# Patient Record
Sex: Female | Born: 1960 | Race: White | Hispanic: No | Smoking: Former smoker
Health system: Southern US, Community
[De-identification: ages and names within clinical notes are randomized; demographics above are authoritative.]

## PROBLEM LIST (undated history)

## (undated) ENCOUNTER — Emergency Department (HOSPITAL_COMMUNITY): Admission: EM | Discharge status: Against Medical Advice | Payer: Medicare Other

## (undated) DIAGNOSIS — K5900 Constipation, unspecified: Secondary | ICD-10-CM

## (undated) DIAGNOSIS — I341 Nonrheumatic mitral (valve) prolapse: Secondary | ICD-10-CM

## (undated) DIAGNOSIS — F419 Anxiety disorder, unspecified: Secondary | ICD-10-CM

## (undated) DIAGNOSIS — F32A Depression, unspecified: Secondary | ICD-10-CM

## (undated) DIAGNOSIS — R062 Wheezing: Secondary | ICD-10-CM

## (undated) DIAGNOSIS — M47812 Spondylosis without myelopathy or radiculopathy, cervical region: Secondary | ICD-10-CM

## (undated) DIAGNOSIS — G1221 Amyotrophic lateral sclerosis: Principal | ICD-10-CM

## (undated) DIAGNOSIS — F329 Major depressive disorder, single episode, unspecified: Secondary | ICD-10-CM

## (undated) DIAGNOSIS — J189 Pneumonia, unspecified organism: Secondary | ICD-10-CM

## (undated) DIAGNOSIS — K219 Gastro-esophageal reflux disease without esophagitis: Secondary | ICD-10-CM

## (undated) HISTORY — PX: EYE SURGERY: SHX253

## (undated) HISTORY — DX: Spondylosis without myelopathy or radiculopathy, cervical region: M47.812

## (undated) HISTORY — PX: VAGINA SURGERY: SHX829

## (undated) HISTORY — PX: KIDNEY DONATION: SHX685

## (undated) HISTORY — DX: Wheezing: R06.2

## (undated) HISTORY — DX: Nonrheumatic mitral (valve) prolapse: I34.1

## (undated) HISTORY — DX: Amyotrophic lateral sclerosis: G12.21

## (undated) HISTORY — PX: BREAST SURGERY: SHX581

## (undated) HISTORY — DX: Gastro-esophageal reflux disease without esophagitis: K21.9

## (undated) HISTORY — DX: Anxiety disorder, unspecified: F41.9

## (undated) HISTORY — DX: Major depressive disorder, single episode, unspecified: F32.9

## (undated) HISTORY — DX: Depression, unspecified: F32.A

## (undated) HISTORY — PX: ABDOMINAL HERNIA REPAIR: SHX539

---

## 1999-06-08 ENCOUNTER — Other Ambulatory Visit: Admission: RE | Admit: 1999-06-08 | Discharge: 1999-06-08 | Payer: Self-pay | Admitting: Obstetrics & Gynecology

## 1999-08-27 ENCOUNTER — Emergency Department (HOSPITAL_COMMUNITY): Admission: EM | Admit: 1999-08-27 | Discharge: 1999-08-27 | Payer: Self-pay | Admitting: Emergency Medicine

## 2000-03-01 ENCOUNTER — Encounter: Payer: Self-pay | Admitting: Emergency Medicine

## 2000-03-01 ENCOUNTER — Emergency Department (HOSPITAL_COMMUNITY): Admission: EM | Admit: 2000-03-01 | Discharge: 2000-03-01 | Payer: Self-pay | Admitting: Emergency Medicine

## 2000-04-23 ENCOUNTER — Other Ambulatory Visit: Admission: RE | Admit: 2000-04-23 | Discharge: 2000-04-23 | Payer: Self-pay | Admitting: Obstetrics and Gynecology

## 2001-01-08 ENCOUNTER — Emergency Department (HOSPITAL_COMMUNITY): Admission: EM | Admit: 2001-01-08 | Discharge: 2001-01-08 | Payer: Self-pay | Admitting: Emergency Medicine

## 2001-07-04 ENCOUNTER — Emergency Department (HOSPITAL_COMMUNITY): Admission: EM | Admit: 2001-07-04 | Discharge: 2001-07-05 | Payer: Self-pay | Admitting: Emergency Medicine

## 2001-07-09 ENCOUNTER — Other Ambulatory Visit: Admission: RE | Admit: 2001-07-09 | Discharge: 2001-07-09 | Payer: Self-pay | Admitting: Obstetrics and Gynecology

## 2002-07-14 ENCOUNTER — Other Ambulatory Visit: Admission: RE | Admit: 2002-07-14 | Discharge: 2002-07-14 | Payer: Self-pay | Admitting: Obstetrics and Gynecology

## 2002-12-03 ENCOUNTER — Emergency Department (HOSPITAL_COMMUNITY): Admission: EM | Admit: 2002-12-03 | Discharge: 2002-12-03 | Payer: Self-pay | Admitting: Emergency Medicine

## 2003-10-15 ENCOUNTER — Other Ambulatory Visit: Admission: RE | Admit: 2003-10-15 | Discharge: 2003-10-15 | Payer: Self-pay | Admitting: Obstetrics and Gynecology

## 2004-03-30 ENCOUNTER — Other Ambulatory Visit: Admission: RE | Admit: 2004-03-30 | Discharge: 2004-03-30 | Payer: Self-pay | Admitting: Obstetrics and Gynecology

## 2004-07-12 ENCOUNTER — Ambulatory Visit (HOSPITAL_COMMUNITY): Admission: RE | Admit: 2004-07-12 | Discharge: 2004-07-12 | Payer: Self-pay | Admitting: Obstetrics and Gynecology

## 2005-02-13 ENCOUNTER — Other Ambulatory Visit: Admission: RE | Admit: 2005-02-13 | Discharge: 2005-02-13 | Payer: Self-pay | Admitting: Obstetrics and Gynecology

## 2005-11-16 ENCOUNTER — Ambulatory Visit (HOSPITAL_COMMUNITY): Admission: RE | Admit: 2005-11-16 | Discharge: 2005-11-17 | Payer: Self-pay | Admitting: Obstetrics and Gynecology

## 2010-06-13 ENCOUNTER — Emergency Department (HOSPITAL_COMMUNITY)
Admission: EM | Admit: 2010-06-13 | Discharge: 2010-06-14 | Disposition: A | Discharge status: Home / Self Care | Payer: 59 | Attending: Emergency Medicine | Admitting: Emergency Medicine

## 2010-06-13 DIAGNOSIS — Y92009 Unspecified place in unspecified non-institutional (private) residence as the place of occurrence of the external cause: Secondary | ICD-10-CM | POA: Insufficient documentation

## 2010-06-13 DIAGNOSIS — W260XXA Contact with knife, initial encounter: Secondary | ICD-10-CM | POA: Insufficient documentation

## 2010-06-13 DIAGNOSIS — K219 Gastro-esophageal reflux disease without esophagitis: Secondary | ICD-10-CM | POA: Insufficient documentation

## 2010-06-13 DIAGNOSIS — S61209A Unspecified open wound of unspecified finger without damage to nail, initial encounter: Secondary | ICD-10-CM | POA: Insufficient documentation

## 2010-06-27 ENCOUNTER — Ambulatory Visit (HOSPITAL_COMMUNITY)
Admission: AD | Admit: 2010-06-27 | Discharge: 2010-06-27 | Disposition: A | Discharge status: Home / Self Care | Payer: 59 | Source: Ambulatory Visit | Attending: Obstetrics and Gynecology | Admitting: Obstetrics and Gynecology

## 2010-06-27 DIAGNOSIS — T83711A Erosion of implanted vaginal mesh and other prosthetic materials to surrounding organ or tissue, initial encounter: Secondary | ICD-10-CM | POA: Insufficient documentation

## 2010-06-27 DIAGNOSIS — Y838 Other surgical procedures as the cause of abnormal reaction of the patient, or of later complication, without mention of misadventure at the time of the procedure: Secondary | ICD-10-CM | POA: Insufficient documentation

## 2010-06-27 LAB — CBC
HCT: 40.9 % (ref 36.0–46.0)
Hemoglobin: 13.6 g/dL (ref 12.0–15.0)
MCH: 32.5 pg (ref 26.0–34.0)
MCHC: 33.3 g/dL (ref 30.0–36.0)
MCV: 97.8 fL (ref 78.0–100.0)
Platelets: 205 10*3/uL (ref 150–400)
RBC: 4.18 MIL/uL (ref 3.87–5.11)
RDW: 13.1 % (ref 11.5–15.5)
WBC: 7.1 10*3/uL (ref 4.0–10.5)

## 2010-07-06 NOTE — H&P (Signed)
NAMEVANEZA, PICKART            ACCOUNT NO.:  [id number]  MEDICAL RECORD NO.:  [id number]           PATIENT TYPE:  LOCATION:                                FAC[facility]:  [facility]  PHYSICIAN:  Osborn Coho, M.D.   DATE OF BIRTH:  1960-08-19  DATE OF ADMISSION:  06/27/2010 DATE OF DISCHARGE:                             HISTORY & PHYSICAL   HISTORY OF PRESENT ILLNESS:  Ms. Dwiggins is a 50 year old single white female, para 1-0-0-1 who is status post right nephrectomy presenting for revision of tension-free vaginal tape because of erosion of tension-free vaginal tape.  The patient underwent a cystoscopy and placement of tension-free vaginal tape in 2007 because of stress urinary incontinence.  The patient's symptoms resolved and was without any complaints until approximately 1 year ago when she began to experience persistent vaginal burning.  Though the patient did receive some relief using Premarin vaginal cream, it was noted on physical exam that she  had some erosion of her tension-free vaginal tape.  She denies any urinary urgency, frequency, hematuria, incontinence, or vaginal itching. The patient presents for revision of the erosion of her tension-free vaginal tape.  PAST MEDICAL HISTORY:  OB History:  Gravida 1, para 1-0-0-1.  The patient had a spontaneous vaginal birth in 77.  The infant weighed 7 pounds and 8 ounces.  GYN History:  Menarche 50 years old.  The patient's last menstrual period was April 2011.  She is currently using condoms for her contraception.  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  The patient's last normal Pap smear was December 2011.  Medical history:  Positive for mitral valve prolapse, gastroesophageal reflux disease, panic attacks, premenstrual syndrome, and osteopenia.  Surgical History:  1965 left eye surgery, 2006 left  nephrectomy as a part of donating a kidney, 2007 placement of tension-free vaginal tape. The patient denies any problems with anesthesia or history of blood transfusions.  FAMILY HISTORY:  Hypertension, stroke, thyroid disease, renal failure, and frontal lobe dementia.  SOCIAL HISTORY:  The patient is a widow.  She is a Designer, jewellery at [employer].  HABITS:  She occasionally consumes alcohol.  She smokes one-quarter pack of cigarettes daily.  Denies any use of illicit drugs.  CURRENT MEDICATIONS: 1. Nexium 40 mg daily. 2. Prozac 90 mg weekly. 3. Premarin vaginal cream per vagina 3 times per week.  The patient has a sensitivity to BACTRIM which causes severe GI upset. She denies any sensitivities to latex, soy, shellfish, or peanuts.  REVIEW OF SYSTEMS:  The patient does wear reading glasses.  She denies any chest pain, shortness of breath, headache, vision changes, difficulty swallowing, nausea, vomiting, diarrhea, urinary urgency, frequency, hematuria, urinary retention, flank pain, myalgias, or arthralgias and except as is mentioned in the history of present illness the patient's review of systems is otherwise negative.  PHYSICAL EXAMINATION:  VITAL SIGNS:  Blood pressure 112/70, pulse is 74, temperature 98.4 degrees Fahrenheit orally, weight 142 pounds, and height 5 feet and 3-1/2 inches' tall.  Body  mass index is 24. NECK:  Supple without masses.  There is no thyromegaly or cervical adenopathy. HEART:  Regular rate and rhythm.  There is no murmur. LUNGS:  Clear.  The patient does have kyphosis. BACK:  Without CVA tenderness. ABDOMEN:  No tenderness, masses, or organomegaly. EXTREMITIES:  No clubbing, cyanosis, or edema. PELVIC:  EGBUS is normal.  Vagina is normal though there is visible and palpable erosion of mesh anteriorly.  There is no tenderness.  Cervix is nontender without lesions.  Uterus appears normal size, shape, and consistency without tenderness.   Adnexa without tenderness or masses.  IMPRESSION: 1. Erosion of tension-free vaginal tape mesh. 2. Status post left nephrectomy.  DISPOSITION:  A discussion was held with the patient regarding indications for her procedure along with its risks which include but are not limited to reaction to anesthesia, damage to adjacent organs, infection, excessive bleeding, erosion of tension-free vaginal tape, and urinary tract symptoms.  The patient verbalized understanding of these risks and has consented to proceed with revision of tension-free vaginal tape mesh at [facility] on Jun 27, 2010, at 9:30 a.m.     Elmira J. Lowell Guitar, P.A.-C   ______________________________ Osborn Coho, M.D.    EJP/MEDQ  D:  06/20/2010  T:  06/21/2010  Job:  [id number]  Electronically Signed by Raylene Everts. on 06/22/2010 08:37:39 AM Electronically Signed by Osborn Coho M.D. on 07/06/2010 07:39:57 AM

## 2010-07-20 NOTE — Op Note (Signed)
  [name], Katherine Potts            ACCOUNT NO.:  [id number]  MEDICAL RECORD NO.:  [id number]           PATIENT TYPE:  O  LOCATION:  [facility]                          [facility]  [facility]  PHYSICIAN:  Osborn Coho, M.D.   DATE OF BIRTH:  08-Jun-1960  DATE OF PROCEDURE:  06/27/2010 DATE OF DISCHARGE:  06/27/2010                              OPERATIVE REPORT   PREOPERATIVE DIAGNOSIS:  Erosion of TVT mesh.  POSTOPERATIVE DIAGNOSIS:  Erosion of TVT mesh.  PROCEDURE:  Revision of TVT mesh.  ATTENDING SURGEON:  Osborn Coho, MD  ASSISTANT:  Marquis Lunch. Lowell Guitar, PA  ANESTHESIA:  MAC.  IV FLUIDS:  600 mL.  URINE OUTPUT:  50 mL.  ESTIMATED BLOOD LOSS:  Minimal.  COMPLICATIONS:  None.  PROCEDURE IN DETAIL:  The patient was taken to the operating room. After risks, benefits, and alternatives were discussed with the patient, the patient verbalized understanding, consent signed and witnessed.  The patient was given MAC per Anesthesia and prepped and draped in the normal sterile fashion in the dorsal lithotomy position.  A weighted speculum was placed in the patient's vagina and the area where the erosion on the anterior vaginal wall was identified.  The area at the level of the erosion was injected with dilute 20 units of Pitressin and 100 mL of normal saline.  The anterior vaginal wall was dissected away from the underlying mesh and the underlying tissue and the exposed mesh was excised.  There was slack created so that the vaginal wall could be repaired and was then repaired with interrupted stitches of 3-0 Vicryl. There was minimal bleeding noted.  Sponge, lap, and needle count was correct.  The patient tolerated the procedure well and was returned to the recovery room in good condition.     Osborn Coho, M.D.     AR/MEDQ  D:  07/19/2010  T:  07/19/2010  Job:  [id number]  Electronically Signed by Osborn Coho M.D. on 07/20/2010 10:49:00 AM

## 2012-03-04 ENCOUNTER — Ambulatory Visit (HOSPITAL_COMMUNITY)
Admission: RE | Admit: 2012-03-04 | Discharge: 2012-03-04 | Disposition: A | Discharge status: Home / Self Care | Payer: 59 | Attending: Psychiatry | Admitting: Psychiatry

## 2012-03-04 DIAGNOSIS — F329 Major depressive disorder, single episode, unspecified: Secondary | ICD-10-CM | POA: Insufficient documentation

## 2012-03-04 DIAGNOSIS — F3289 Other specified depressive episodes: Secondary | ICD-10-CM | POA: Insufficient documentation

## 2012-03-04 NOTE — BH Assessment (Signed)
Assessment Note   Katherine Potts is an 52 y.o. female. Pt came for appointment as recommended by [facility] for psych IOP. Has been depressed since the Christmas holiday. Dealing with grief as her Grandmother died 2011/11/13 who lived with her for 9 years. 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 Has been on prozac for years but depression has become much worse. Denies SI/ HI or psychosis. Contracts for safety and states she has never had suicidal thoughts. No past inpatient or outpatient care. "I have things I have to do but no motivation." Would like to start IOP as soon as possible. Information given about IOP with phone number for Jeri Modena to schedule appointment start date. Denies drug use, admits to occasional wine use and 1/2 pack cigarettes daily.  Axis I: Depressive Disorder NOS Axis II: No diagnosis Axis III: No past medical history on file. Axis IV: occupational problems, problems related to social environment and problems with primary support group Axis V: 51-60 moderate symptoms  Past Medical History: No past medical history on file.  No past surgical history on file.  Family History: No family history on file.  Social History:  does not have a smoking history on file. She does not have any smokeless tobacco history on file. Her alcohol and drug histories not on file.  Additional Social History:  Alcohol / Drug Use History of alcohol / drug use?: Yes Substance #1 Name of Substance 1: Wine 1 - Age of First Use: 30's 1 - Amount (size/oz): 1 glass 1 - Frequency: occassionally 1 - Duration: years 1 - Last Use / Amount: 02/26/12 2 glasses  CIWA:   COWS:    Allergies: Allergies no known allergies  Home Medications:  (Not in a hospital admission)  OB/GYN Status:  No LMP  recorded.  General Assessment Data Location of Assessment: [facility] Assessment Services Living Arrangements: Alone;Other (Comment) (24 year old son lives with her) Can pt return to current living arrangement?: Yes Referral Source: Other (employee assistance program with [employer])  Education Status Is patient currently in school?: No  Risk to self Suicidal Ideation: No Suicidal Intent: No Is patient at risk for suicide?: No Suicidal Plan?: No Access to Means: No What has been your use of drugs/alcohol within the last 12 months?: occassional wine Previous Attempts/Gestures: No Other Self Harm Risks: none Intentional Self Injurious Behavior: None Family Suicide History: No Recent stressful life event(s):  (Grandmother passed away Nov 13, 2011) Persecutory voices/beliefs?: No Depression: Yes Depression Symptoms: Fatigue;Feeling worthless/self pity Substance abuse history and/or treatment for substance abuse?: No Suicide prevention information given to non-admitted patients: Yes  Risk to Others Homicidal Ideation: No Thoughts of Harm to Others: No Current Homicidal Intent: No History of harm to others?: No Does patient have access to weapons?: No Criminal Charges Pending?: No Does patient have a court date: No  Psychosis Hallucinations: None noted Delusions: None noted  Mental Status Report Appear/Hygiene: Other (Comment) (appropriate, clean) Eye Contact: Good Motor Activity: Unremarkable Speech: Logical/coherent Level of Consciousness: Alert Mood: Depressed Affect: Blunted Anxiety Level: None Thought Processes: Coherent;Relevant Judgement: Unimpaired Orientation: Person;Place;Time;Situation Obsessive Compulsive Thoughts/Behaviors: None  Cognitive Functioning Concentration: Decreased Memory: Recent Intact;Remote Intact IQ: Average Insight: Good Impulse Control: Good Appetite: Poor Weight Loss: 0  Weight Gain: 0  Sleep:  Decreased (toss and turn, trouble getting to  sleep) Total Hours of Sleep:  (unknown) Vegetative Symptoms: Staying in bed  ADLScreening [facility]) Patient's cognitive ability adequate to safely complete daily activities?: Yes Patient able to express need for assistance with ADLs?: Yes Independently performs ADLs?: Yes (appropriate for developmental age)  Abuse/Neglect [facility]) Physical Abuse: Denies Verbal Abuse: Denies Sexual Abuse: Denies  Prior Inpatient Therapy Prior Inpatient Therapy: No  Prior Outpatient Therapy Prior Outpatient Therapy: No  ADL Screening (condition at time of admission) Patient's cognitive ability adequate to safely complete daily activities?: Yes Patient able to express need for assistance with ADLs?: Yes Independently performs ADLs?: Yes (appropriate for developmental age) Weakness of Legs: None Weakness of Arms/Hands: None  Home Assistive Devices/Equipment Home Assistive Devices/Equipment: None    Abuse/Neglect Assessment (Assessment to be complete while patient is alone) Physical Abuse: Denies Verbal Abuse: Denies Sexual Abuse: Denies Exploitation of patient/patient's resources: Denies Self-Neglect: Denies Values / Beliefs Cultural Requests During Hospitalization: None Spiritual Requests During Hospitalization: None Consults Spiritual Care Consult Needed: No Social Work Consult Needed: No Merchant navy officer (For Healthcare) Advance Directive: Patient does not have advance directive;Patient would not like information Pre-existing out of facility DNR order (yellow form or pink MOST form): No Nutrition Screen- [facility] Adult/[facility]/AP Patient's home diet: Regular  Additional Information 1:1 In Past 12 Months?: No CIRT Risk: No Elopement Risk: No Does patient have medical clearance?: No     Disposition:  Disposition Disposition of Patient: Outpatient treatment;Referred to Type of outpatient treatment: Psych Intensive Outpatient Patient referred to: Other (Comment) (pt given  information to IOP with Altha Harm number to call)  On Site Evaluation by:   Reviewed with Physician:     Leafy Kindle 03/04/2012 4:56 PM

## 2012-03-06 ENCOUNTER — Encounter (HOSPITAL_COMMUNITY): Payer: Self-pay

## 2012-03-06 ENCOUNTER — Other Ambulatory Visit (HOSPITAL_COMMUNITY): Payer: 59 | Attending: Psychiatry | Admitting: Psychiatry

## 2012-03-06 DIAGNOSIS — F411 Generalized anxiety disorder: Secondary | ICD-10-CM | POA: Insufficient documentation

## 2012-03-06 DIAGNOSIS — F3289 Other specified depressive episodes: Secondary | ICD-10-CM | POA: Insufficient documentation

## 2012-03-06 DIAGNOSIS — F32A Depression, unspecified: Secondary | ICD-10-CM | POA: Insufficient documentation

## 2012-03-06 DIAGNOSIS — F329 Major depressive disorder, single episode, unspecified: Secondary | ICD-10-CM | POA: Insufficient documentation

## 2012-03-06 DIAGNOSIS — K219 Gastro-esophageal reflux disease without esophagitis: Secondary | ICD-10-CM

## 2012-03-06 MED ORDER — BUPROPION HCL 75 MG PO TABS: 75.0000 mg | ORAL_TABLET | Freq: Every day | ORAL | Status: DC

## 2012-03-06 NOTE — Progress Notes (Signed)
Patient ID: Katherine Potts, female   DOB: Dec 03, 1960, 52 y.o.   MRN: [id number] Chief complaint Depressed and very anxious.  History of presenting illness Patient is 52 year old widowed employed female who is referred by [employer] assistant program for intensive outpatient program.  Patient has been depressed since holidays.  Her grandmother died in 17-Sep-2013who was living with her for past 10 years.  Patient was very attached to her.  Patient also endorse recent job changes causes increased stress in her life.  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  Patient has difficulty adjusting to her new job.  She admitted to past few weeks she is having panic attack, crying spells, insomnia, racing thoughts and no motivation to do things.  She feel some time hopeless and helpless but denies any active or passive suicidal thoughts.  She admitted poor attention poor concentration and feeling fatigue.  She also admitted occasional auditory hallucination offer deceased grandmother denies any visual hallucination or any paranoid thinking.  She is taking Prozac and Ativan which is given by Dr. Evelene Croon and recently she was recommended to try Wellbutrin XL 150 however patient tried to 2 days but felt very jittery and more anxious.  She stopped taking Wellbutrin.  Patient also endorse stress about her 72 year old son who stop going to school.  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  Patient is worried about her son.  Patient denies any side effects of Prozac.  Current psychiatric medication Prozac 40 mg daily Ativan 1 mg up to twice a day as needed for extreme anxiety.  Past psychiatric history Patient denies any previous history of psychiatric inpatient treatment or any suicidal attempt.  She denies any history of mania psychosis or any paranoia.  She's been taking  psychotropic medications since 07-13-03.  Her mother died in 2002-07-13 and she had a hard time adjusting to her death.  Psychosocial history Patient was born and raised in [place].  She's been married once.  Her husband died due to cancer.  She never remarried.  Initially she moved from [place] to [place], [place] due to her husband's job.  She stays in [place] 3 years.  Patient moved to [place] to live close to her mother.  Who died in 2002-07-13.  Patient has one son who is 40 years old.  Patient denies any history of physical sexual verbal or emotional abuse.    Medical history Patient has history of GERD.  She sees [facility] D[facility]ick at [facility].  She has eye surgery in her childhood .  She also had breast cosmetic surgery.    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    Alcohol and substance use history Patient admitted occasional use of drinking and marijuana.  She denies any binge drinking.  She denies any withdrawals .    Mental status examination Patient is casually dressed and fairly groomed.  She appears to be in his stated age.  She maintained fair eye contact.  She appears very anxious but cooperative.  Her speech is clear and coherent.  Her thought process is organized logical and goal-directed.  She described her mood is anxious and depressed and her affect is constricted.  There were no flight of ideas or any loose association.  She denies any  active or passive suicidal thoughts or homicidal thoughts.  She denies any auditory or visual hallucination at this time.  There were no paranoia or delusion present.  Her fund of knowledge is adequate.  There were no tremors present.  She's alert and oriented x3.  Her insight judgment and impulse control is okay.  Assessment Axis I depressive disorder NOS Axis II deferred Axis III GERD   Axis IV mild to moderate Axis V 55-60  Plan I review her symptoms, medication and response to the medication.   I recommend to try low-dose Wellbutrin with Prozac to help her anxiety symptoms.  We will admit this patient for intensive outpatient program.  We will oriented about program.  I encourage her to participate in the program and verbalize her feelings.  He also talk about stopping alcohol and marijuana due to interaction with psychotropic medication and worsening of her symptoms.  Talked about safety plan that anytime having active suicidal thoughts then she need to and from the staff or call 911.  I explained risks and benefits of medication.  She will try Wellbutrin 75 mg with Prozac.  Estimated length of program 2-3 weeks.

## 2012-03-06 NOTE — Progress Notes (Signed)
    Daily Group Progress Note  Program: IOP  Group Time: 9:00-10:30 am   Participation Level: Minimal  Behavioral Response: Appropriate  Type of Therapy:  Process Group  Summary of Progress: Today was patients first day in the group. She was attentive and observed the group process after being introduced to the other group members.      Group Time: 10:30 am - 12:00 pm   Participation Level:  Minimal  Behavioral Response: Appropriate  Type of Therapy: Psycho-education Group  Summary of Progress: Patient participated in a goodbye ceremony for two members ending the group today and practiced skills of having healthy closures.   Carman Ching, LCSW

## 2012-03-06 NOTE — Progress Notes (Signed)
Patient ID: Katherine Potts, female   DOB: 01/08/1961, 52 y.o.   MRN: [id number] D:  This is a 52 year old widowed caucasian employed female who is referred by [employer] assistant program for intensive outpatient program. Patient has been depressed since holidays. Her grandmother died in 10/02/2013who was living with her for past 10 years. Patient was very attached to her. Patient also endorse recent job changes which caused increased stress in her life. 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 Patient has difficulty adjusting to her new job. She admitted to past few weeks she is having panic attack, crying spells, insomnia, racing thoughts and no motivation to do things. She feel some time hopeless and helpless but denies any active or passive suicidal thoughts. She admitted poor attention poor concentration and feeling fatigue. She also admitted occasional auditory hallucination offer deceased grandmother denies any visual hallucination or any paranoid thinking. Patient also endorse stress about her 59 year old son who stop going to school. As per patient her son reported that he is not getting enough teaching in the school. Patient is worried about her son.  Childhood:  Born in [place], [place].  Denied any abuse/trauma. Siblings:  Two younger brothers (one of which suffers with Bipolar D/O) Drugs/ETOH:  Admits to [facility] use.  States she smokes once a week.  Denies any ETOH. Pt completed all forms.  Scored 29 on the burns.  Will attend for two weeks.  A:  Oriented pt.  Informed Dr. Evelene Croon and Hurley Cisco, LCSW of admit.  Encouraged support groups and abstinence from [facility].  Provide pt with an orientation folder.  R:  Pt receptive.

## 2012-03-07 ENCOUNTER — Other Ambulatory Visit (HOSPITAL_COMMUNITY): Payer: 59 | Admitting: Psychiatry

## 2012-03-07 DIAGNOSIS — F329 Major depressive disorder, single episode, unspecified: Secondary | ICD-10-CM

## 2012-03-07 NOTE — Progress Notes (Signed)
    Daily Group Progress Note  Program: IOP  Group Time: 9:00-10:30 am   Participation Level: Minimal  Behavioral Response: Appropriate  Type of Therapy:  Process Group  Summary of Progress: Patient reports feeling less depressed today due to the support of the group. She has yet to share her stressors and reasons for entering the group, but is being attentive and observing others sharing. She is working on feeling comfortable and safe in the group setting.      Group Time: 10:30 am - 12:00 pm  Participation Level:  Minimal  Behavioral Response: Appropriate  Type of Therapy: Psycho-education Group  Summary of Progress: Patient learned about the importance of planning for daily wellness by creating a daily wellness routine list and identifying self-care items to use in addition to this list when stress increases.  Carman Ching, LCSW

## 2012-03-10 ENCOUNTER — Other Ambulatory Visit (HOSPITAL_COMMUNITY): Payer: 59 | Admitting: Psychiatry

## 2012-03-10 DIAGNOSIS — F329 Major depressive disorder, single episode, unspecified: Secondary | ICD-10-CM

## 2012-03-10 NOTE — Progress Notes (Signed)
    Daily Group Progress Note  Program: IOP  Group Time: 9:00-10:30 am   Participation Level: Active  Behavioral Response: Appropriate  Type of Therapy:  Process Group  Summary of Progress: Patient shared today her reasons for entering the group for the first time since starting. 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 She said her depression became so high she stopped going to work and isolated at home. She received support from others with similar work stressors. She reports her depression as high today.      Group Time: 10:30 am - 12:00 pm   Participation Level:  Active  Behavioral Response: Appropriate  Type of Therapy: Psycho-education Group  Summary of Progress: Patient participated in a group facilitated by Theda Belfast on Grief and Loss and learned healthy was to grieve losses.   Carman Ching, LCSW

## 2012-03-11 ENCOUNTER — Other Ambulatory Visit (HOSPITAL_COMMUNITY): Payer: 59 | Admitting: Psychiatry

## 2012-03-11 DIAGNOSIS — F329 Major depressive disorder, single episode, unspecified: Secondary | ICD-10-CM

## 2012-03-11 NOTE — Progress Notes (Signed)
    Daily Group Progress Note  Program: IOP  Group Time: 9:00-10:30 am   Participation Level: Active  Behavioral Response: Appropriate  Type of Therapy:  Process Group  Summary of Progress: Patient reports high anxiety at the thought of returning to her job. She feels like she is not an expert in the unit she has been transitioned to and fears not doing things correctly and being judged negatively by her peers and the doctors. She said she needs to learn coping skills to challenge her fears that she is incompetent on the job.      Group Time: 10:30 am - 12:00 pm  Participation Level:  Active  Behavioral Response: Appropriate  Type of Therapy: Psycho-education Group  Summary of Progress: Patient learned about the CBT skill of reframing negative thoughts to reduce symptoms of depression and anxiety.   Carman Ching, LCSW

## 2012-03-12 ENCOUNTER — Other Ambulatory Visit (HOSPITAL_COMMUNITY): Payer: 59 | Admitting: Psychiatry

## 2012-03-12 DIAGNOSIS — F329 Major depressive disorder, single episode, unspecified: Secondary | ICD-10-CM

## 2012-03-12 NOTE — Progress Notes (Signed)
    Daily Group Progress Note  Program: IOP  Group Time: 9:00-10:30 am   Participation Level: Active  Behavioral Response: Appropriate  Type of Therapy:  Process Group  Summary of Progress: Patient reports feeling high depression and anxiety today. She still worries about how she will be able to manage job stress when she returns to work. She is trying to challenge her negative thoughts, but has fears that she will not receive support and will be judged negatively by others due to not feeling as competent in this new department. She is working on Copywriter, advertising.      Group Time: 10:30 am - 12:00 pm   Participation Level:  Active  Behavioral Response: Appropriate  Type of Therapy: Psycho-education Group  Summary of Progress: Patient participated in a skills group on healthy boundary setting and identified personality types that make it difficult to set limits with others and also explored the five boundary areas to explore that may need limit setting to increase emotional wellness.   Carman Ching, LCSW

## 2012-03-13 ENCOUNTER — Other Ambulatory Visit (HOSPITAL_COMMUNITY): Payer: 59 | Admitting: Psychiatry

## 2012-03-13 DIAGNOSIS — F329 Major depressive disorder, single episode, unspecified: Secondary | ICD-10-CM

## 2012-03-14 ENCOUNTER — Other Ambulatory Visit (HOSPITAL_COMMUNITY): Payer: 59 | Admitting: Psychiatry

## 2012-03-14 DIAGNOSIS — F329 Major depressive disorder, single episode, unspecified: Secondary | ICD-10-CM

## 2012-03-14 NOTE — Progress Notes (Signed)
    Daily Group Progress Note  Program: IOP  Group Time: 9:00-10:30 am   Participation Level: Active  Behavioral Response: Appropriate  Type of Therapy:  Process Group  Summary of Progress:  Patient participated in a goodbye ceremony for two members ending the group today and expressed feelings associated with them leaving the group and expressed how they had both impacted patient during their time in the program.       Group Time: 10:30 am - 12:00 pm   Participation Level:  Active  Behavioral Response: Appropriate  Type of Therapy: Psycho-education Group  Summary of Progress:  Patient learned the skill of healthy boundary setting and how to set healthy limits with others to ensure their own personal wellness.   Azure Budnick E, LCSW 

## 2012-03-14 NOTE — Progress Notes (Signed)
    Daily Group Progress Note  Program: IOP  Group Time: 9:00-10:30 am   Participation Level: Active  Behavioral Response: Appropriate  Type of Therapy:  Process Group  Summary of Progress: Patient told the group about her difficulty dealing with her grandmother's death 4 months ago. She said that she has not cried and doesn't understand why she hasn't cried yet. She also told the group about her son and how he should be able to graduate on time. Camelia Eng continues to work through her anxiety and dealing with grief and loss.      Group Time: 10:30 am - 12:00 pm   Participation Level:  Active  Behavioral Response: Appropriate  Type of Therapy: Psycho-education Group  Summary of Progress: Patient learned a stress management tool (heartmath) and how to use it to reduce stress and maintain wellness.  Carman Ching, LCSW

## 2012-03-17 ENCOUNTER — Other Ambulatory Visit (HOSPITAL_COMMUNITY): Payer: 59 | Admitting: Psychiatry

## 2012-03-17 DIAGNOSIS — F329 Major depressive disorder, single episode, unspecified: Secondary | ICD-10-CM

## 2012-03-17 NOTE — Progress Notes (Signed)
    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 She is trying to learn stress coping skills to be able to return to this stressful working environment.      Group Time: 10:30 am - 12:00 pm   Participation Level:  Active  Behavioral Response: Appropriate  Type of Therapy: Psycho-education Group  Summary of Progress: Patient participated in a group on grief and loss facilitated by Theda Belfast and identified healthy ways of grieving.  Carman Ching, LCSW

## 2012-03-17 NOTE — Progress Notes (Signed)
Patient ID: Katherine Potts, female   DOB: April 11, 1960, 52 y.o.   MRN: [id number] Patient complained of headache.  She feels since start taking Wellbutrin she is having headaches.  It is constant and every day.  I recommend to stop for 2 days to see if the headache resolves.  Also recommend if she feels worsening of depression than restart taking Wellbutrin and let us know immediately.

## 2012-03-18 ENCOUNTER — Telehealth (HOSPITAL_COMMUNITY): Payer: Self-pay | Admitting: Psychiatry

## 2012-03-18 ENCOUNTER — Other Ambulatory Visit (HOSPITAL_COMMUNITY): Payer: 59

## 2012-03-18 ENCOUNTER — Telehealth (HOSPITAL_COMMUNITY): Payer: Self-pay

## 2012-03-19 ENCOUNTER — Other Ambulatory Visit (HOSPITAL_COMMUNITY): Payer: 59 | Admitting: Psychiatry

## 2012-03-19 DIAGNOSIS — F329 Major depressive disorder, single episode, unspecified: Secondary | ICD-10-CM

## 2012-03-19 NOTE — Progress Notes (Signed)
    Daily Group Progress Note  Program: IOP  Group Time: 9:00-10:30 am   Participation Level: Active  Behavioral Response: Appropriate  Type of Therapy:  Process Group  Summary of Progress: Patient reports feeling much less depressed and anxious than when she began the group after having some time off from work and the ability to process stressors. She problem solved her situation at work and explored how to talk with her supervisor regarding concerns about returning to an unsupportive and stressful peer work environment.      Group Time: 10:30 am - 12:00 pm   Participation Level:  Active  Behavioral Response: Appropriate  Type of Therapy: Psycho-education Group  Summary of Progress: Patient learned the skill of Mindfulness and how to use it to be less emotionally reactive.   Carman Ching, LCSW

## 2012-03-20 ENCOUNTER — Other Ambulatory Visit (HOSPITAL_COMMUNITY): Payer: 59 | Admitting: Psychiatry

## 2012-03-20 DIAGNOSIS — F329 Major depressive disorder, single episode, unspecified: Secondary | ICD-10-CM

## 2012-03-20 NOTE — Progress Notes (Signed)
    Daily Group Progress Note  Program: IOP  Group Time: 9:00-10:30 am   Participation Level: Active  Behavioral Response: Appropriate  Type of Therapy:  Process Group  Summary of Progress: Patient gave a thumbs up for non-verbal check in today, meaning that she is doing well. Patient explained that she is beginning to "feel normal again". She told the group that she cooked dinner for her son last night and is finally feeling good again. She explained that she knows what she needs to work on and plans to continue on her journey to wellness.      Group Time: 10:30 am - 12:00 pm   Participation Level:  Active  Behavioral Response: Appropriate  Type of Therapy: Psycho-education Group  Summary of Progress: Patient learned how to use the skill of Mindfulness using a mindfulness meditation as a tool for reducing anxiety and depression.  Carman Ching, LCSW

## 2012-03-21 ENCOUNTER — Other Ambulatory Visit (HOSPITAL_COMMUNITY): Payer: 59 | Admitting: Psychiatry

## 2012-03-21 DIAGNOSIS — F329 Major depressive disorder, single episode, unspecified: Secondary | ICD-10-CM

## 2012-03-21 NOTE — Progress Notes (Signed)
    Daily Group Progress Note  Program: IOP  Group Time: 9:00-10:30 am   Participation Level: Active  Behavioral Response: Appropriate  Type of Therapy:  Process Group  Summary of Progress: Patient reports continued improvement with her depression and anxiety. She is concerned about how she will maintain anxiety management next week when she returns to her job. She is learning coping skills to help her have more confidence in herself and to be able to handle criticism should she receive it on the job as well as anxiety management techniques.      Group Time: 10:30 am - 12:00 pm   Participation Level:  Active  Behavioral Response: Appropriate  Type of Therapy: Psycho-education Group  Summary of Progress: Patient participated in a goodbye ceremony and had the opportunity to express feelings of sadness and loss over the members ending the program and have a healthy loss experience.   Carman Ching, LCSW

## 2012-03-24 ENCOUNTER — Other Ambulatory Visit (HOSPITAL_COMMUNITY): Payer: 59 | Admitting: Psychiatry

## 2012-03-24 DIAGNOSIS — F329 Major depressive disorder, single episode, unspecified: Secondary | ICD-10-CM

## 2012-03-24 NOTE — Progress Notes (Signed)
    Daily Group Progress Note  Program: IOP  Group Time: 9:00-10:30 am   Participation Level: Active  Behavioral Response: Appropriate  Type of Therapy:  Process Group  Summary of Progress: Patient did not share much today but expressed concerns about returning to work this week following ending the group tomorrow. She is still struggling with the skill of assertiveness and how to use communication to have her needs met. She was encouraged to share tomorrow and inform the group about where she is as she ends in the group.      Group Time: 10:30 am - 12:00 pm   Participation Level:  Active  Behavioral Response: Appropriate  Type of Therapy: Psycho-education Group  Summary of Progress: patient participated in a group facilitated by Theda Belfast on Grief and loss and identified current losses impacting wellness and ways to appropriately grieve them.  Carman Ching, LCSW

## 2012-03-25 ENCOUNTER — Other Ambulatory Visit (HOSPITAL_COMMUNITY): Payer: 59 | Admitting: Psychiatry

## 2012-03-25 DIAGNOSIS — F329 Major depressive disorder, single episode, unspecified: Secondary | ICD-10-CM

## 2012-03-25 NOTE — Patient Instructions (Signed)
Patient completed MH-IOP today.  Follow up with Hurley Cisco, LCSW on today and Dr. Evelene Croon on 04-19-12.  Encouraged support groups.

## 2012-03-25 NOTE — Progress Notes (Signed)
    Daily Group Progress Note  Program: IOP  Group Time: 9:00-10:30 am   Participation Level: Active  Behavioral Response: Appropriate  Type of Therapy:  Process Group  Summary of Progress: Patient is ending in the group today. She reports minimal depression and anxiety and states she feels "good". Her main stressor is regarding returning to work this week. She scheduled a meeting with her supervisor to discuss environmental stressors in the workplace. Patient is more assertive and able to express her needs clearer. She is using anxiety coping skills and feels she can return to work and maintain stable mood and functio ng. She had a goodbye gift for each member and shared how they helped her with her depression.      Group Time: 10:30 am - 12:00 pm   Participation Level:  Active  Behavioral Response: Appropriate  Type of Therapy: Psycho-education Group  Summary of Progress: Patient participated in saying goodbye to two members ending the program today and practiced the skill of having healthy closure.   Carman Ching, LCSW

## 2012-03-25 NOTE — Progress Notes (Signed)
Patient ID: Katherine Potts, female   DOB: 04/15/1960, 52 y.o.   MRN: [id number] D:  This is a 52 year old widowed caucasian employed female who is referred by [employer] assistant program for intensive outpatient program. Patient has been depressed since holidays. Her grandmother died in 28-Sep-2013who was living with her for past 10 years. Patient was very attached to her. Patient also endorse recent job changes which caused increased stress in her life. 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 Patient has difficulty adjusting to her new job. She admitted to past few weeks she is having panic attack, crying spells, insomnia, racing thoughts and no motivation to do things. She feel some time hopeless and helpless but denies any active or passive suicidal thoughts. She admitted poor attention poor concentration and feeling fatigue. She also admitted occasional auditory hallucination offer deceased grandmother denies any visual hallucination or any paranoid thinking. Patient also endorse stress about her 83 year old son who stop going to school. As per patient her son reported that he is not getting enough teaching in the school. Patient is worried about her son.  Pt completed MH-IOP today.  Denies any SI/HI or A/V hallucinations.  Reports not feeling hopeless any longer, improved sleep and appetite.  Denies any further THC use. States the groups were helpful.  Pt would like to continue working on positive thinking and relaxation techniques.   A:  D/C pt today.  Will f/u with Hurley Cisco, LCSW today and Dr. Evelene Croon on 04-19-12.  Encouraged support groups.  RTW (without any restrictions) on 03-30-12.  R:  Pt receptive.

## 2012-03-25 NOTE — Progress Notes (Signed)
Discharge Note  Patient:  Katherine Potts is an 52 y.o., female DOB:  1961-01-19  Date of Admission:  03/06/12  Date of Discharge:  03/25/12  Reason for Admission:depression and anxiety due to multiple stressors in her life both at home and at work.  Hospital Course: Patient started IOP and was continued on her medications. She was then started on Wellbutrin but began experiencing headaches after 2 days and so this was discontinued. No other antidepressants were added and she was continued on her Prozac and Ativan. She began opening up gradually in group and learning to control her anxiety. Her sleep improved and she was no longer hypersomnia. Appetite was good, mood was still anxious but had no suicidal or homicidal ideation. Did not feel hopeless or helpless and did not have any hallucinations or delusions. She was tolerating her medications well and was coping well.  Mental Status at Discharge: Alert, oriented x3, affect was full mood was euthymic speech was normal. No suicidal or homicidal ideation was present. No hallucinations or delusions. Recent and remote memory was good, judgment and insight is good, concentration and recall are good.  Lab Results: No results found for this or any previous visit (from the past 48 hour(s)).  Current outpatient prescriptions:esomeprazole (NEXIUM) 40 MG capsule, Take 40 mg by mouth daily before breakfast., Disp: , Rfl: ;  FLUoxetine (PROZAC) 40 MG capsule, Take 40 mg by mouth daily., Disp: , Rfl: ;  LORazepam (ATIVAN) 1 MG tablet, Take 1 mg by mouth every 6 (six) hours as needed., Disp: , Rfl:   Axis Diagnosis:   Axis I: Anxiety Disorder NOS and Depressive Disorder NOS Axis II: Deferred Axis III:  Past Medical History  Diagnosis Date  . GERD (gastroesophageal reflux disease)   . Anxiety   . Depression    Axis IV: other psychosocial or environmental problems, problems related to social environment and problems with primary support group Axis V:  61-70 mild symptoms   Level of Care:  OP  Discharge destination:  Home  Is patient on multiple antipsychotic therapies at discharge:  No    Has Patient had three or more failed trials of antipsychotic monotherapy by history:  No  Patient phone:  [phone] (home)  Patient address:   [address] [place] [address],   Follow-up recommendations:  Activity:  Astolerated Diet:  Regular Other:  Followup for her medications with Dr. Evelene Croon and Verdie Shire for therapy  Comments:    The patient received suicide prevention pamphlet:  Yes Belongings returned:    Margit Banda 03/25/2012, 12:17 PM

## 2012-03-26 ENCOUNTER — Other Ambulatory Visit (HOSPITAL_COMMUNITY): Payer: 59

## 2012-03-27 ENCOUNTER — Other Ambulatory Visit (HOSPITAL_COMMUNITY): Payer: 59

## 2012-07-01 ENCOUNTER — Ambulatory Visit (INDEPENDENT_AMBULATORY_CARE_PROVIDER_SITE_OTHER): Payer: 59 | Admitting: General Surgery

## 2012-07-02 ENCOUNTER — Encounter (INDEPENDENT_AMBULATORY_CARE_PROVIDER_SITE_OTHER): Payer: Self-pay | Admitting: General Surgery

## 2012-07-02 ENCOUNTER — Ambulatory Visit (INDEPENDENT_AMBULATORY_CARE_PROVIDER_SITE_OTHER): Payer: Commercial Managed Care - PPO | Admitting: General Surgery

## 2012-07-02 VITALS — BP 116/68 | HR 56 | Temp 98.4°F | Resp 16 | Ht 64.0 in | Wt 145.0 lb

## 2012-07-02 DIAGNOSIS — K432 Incisional hernia without obstruction or gangrene: Secondary | ICD-10-CM | POA: Insufficient documentation

## 2012-07-02 NOTE — Progress Notes (Signed)
Patient ID: Katherine Potts, female   DOB: 1960-06-01, 52 y.o.   MRN: [id number]  Chief Complaint  Patient presents with  . New Evaluation    eval ventral hernia    HPI Katherine Potts is a 52 y.o. female.  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 She was a living related donor of her right kidney to her father some years ago. This was done laparoscopically. She has an upper midline port site which developed a hernia many years ago. It has gradually gotten larger. Lately it has been more painful. It gets bigger and smaller but usually does not totally reduce. She has not had change in bowel or bladder habits. Of note, she has chronic GERD. She underwent upper endoscopy today by Dr. Laural Benes. Per her report this was normal. His note will be in EPIC today. Past Medical History  Diagnosis Date  . GERD (gastroesophageal reflux disease)   . Anxiety   . Depression   . MVP (mitral valve prolapse)     Past Surgical History  Procedure Laterality Date  . Eye surgery    . Breast surgery    . Vagina surgery      mesh    Family History  Problem Relation Age of Onset  . Bipolar disorder Brother   . Dementia Mother   . Hypertension Father     Social History History  Substance Use Topics  . Smoking status: Former Games developer  . Smokeless tobacco: Never Used  . Alcohol Use: No    Allergies  Allergen Reactions  . Bactrim (Sulfamethoxazole W-Trimethoprim) Nausea Only    Current Outpatient Prescriptions  Medication Sig Dispense Refill  . Calcium Carb-Cholecalciferol (CALCIUM 1000 + D PO) Take by mouth.      . calcium carbonate (TUMS - DOSED IN MG ELEMENTAL CALCIUM) 500 MG chewable tablet Chew 1 tablet by mouth daily.      Marland Kitchen esomeprazole (NEXIUM) 40 MG capsule Take 40 mg by mouth daily before breakfast.      . Flaxseed, Linseed, (FLAX SEED OIL) 1300 MG CAPS Take by mouth.      Marland Kitchen FLUoxetine (PROZAC) 40 MG capsule  Take 40 mg by mouth daily.      . folic acid (FOLVITE) 1 MG tablet Take 1 mg by mouth daily.      Marland Kitchen LORazepam (ATIVAN) 1 MG tablet Take 1 mg by mouth every 6 (six) hours as needed.       No current facility-administered medications for this visit.    Review of Systems Review of Systems  Constitutional: Negative for fever, chills and unexpected weight change.  HENT: Negative for hearing loss, congestion, sore throat, trouble swallowing and voice change.   Eyes: Negative for visual disturbance.  Respiratory: Negative for cough and wheezing.   Cardiovascular: Negative for chest pain, palpitations and leg swelling.  Gastrointestinal: Positive for abdominal pain. Negative for nausea, vomiting, diarrhea, constipation, blood in stool, abdominal distention and anal bleeding.       See HPI  Genitourinary: Negative for hematuria, vaginal bleeding and difficulty urinating.  Musculoskeletal: Negative for arthralgias.  Skin: Negative for rash and wound.  Neurological: Negative for seizures, syncope and headaches.  Hematological: Negative for adenopathy. Does not bruise/bleed easily.  Psychiatric/Behavioral: Negative for confusion.    Blood pressure 116/68, pulse 56, temperature 98.4 F (36.9 C), temperature source Oral, resp. rate 16, height 5\' 4"  (1.626 m), weight 145 lb (65.772 kg).  Physical Exam Physical Exam  Constitutional: She is oriented to person, place, and time. She appears well-developed and well-nourished. No distress.  HENT:  Head: Normocephalic and atraumatic.  Mouth/Throat: No oropharyngeal exudate.  Eyes: EOM are normal. Pupils are equal, round, and reactive to light.  Neck: Normal range of motion. No tracheal deviation present.  Cardiovascular: Normal rate, regular rhythm, normal heart sounds and intact distal pulses.   Pulmonary/Chest: Effort normal and breath sounds normal. No stridor. No respiratory distress. She has no wheezes. She has no rales.  Abdominal: Soft. Bowel  sounds are normal. She exhibits no distension. There is tenderness. There is no rebound and no guarding.    Upper midline incisional hernia, partially reduces, mild tenderness on palpation, no other generalized tenderness or masses, lower midline scar without palpable hernia  Musculoskeletal: Normal range of motion. She exhibits no edema.  Neurological: She is alert and oriented to person, place, and time. She exhibits normal muscle tone. Coordination normal.  Skin: Skin is warm.    Data Reviewed Notes from Dr. Laural Benes and Dr.Nnodi  Assessment    Symptomatic incisional hernia at previous port site    Plan    I've offered laparoscopic repair of incisional hernia with mesh. Procedure, risks, and benefits were discussed in detail with the patient including the risk of need to convert to open procedure. She is agreeable. She will need to be off work for 6 weeks as she does not have light duty available. I answered her questions. We will look forward to scheduling in the near future.       Lylia Karn E 07/02/2012, 10:18 AM

## 2012-07-02 NOTE — Patient Instructions (Signed)
Will schedule surgery.  You'll need to avoid heavy lifting and therefore be off work for 6 weeks after surgery.

## 2012-07-07 ENCOUNTER — Telehealth (INDEPENDENT_AMBULATORY_CARE_PROVIDER_SITE_OTHER): Payer: Self-pay

## 2012-07-07 NOTE — Telephone Encounter (Signed)
The pt came in on Friday and asked about scheduling surgery.  She said her work will not let her off.  She wants to know how urgent it is.  Dr Janee Morn said it is not urgent.  It can be scheduled electively.  He can't talk to her employer.  She has to work it out.  I notified the pt this morning that she can schedule when convenient for her and her job.  She asked if she can put it out a couple months and I told her she can but to call if her symptoms worsen.

## 2012-11-27 ENCOUNTER — Other Ambulatory Visit: Payer: Self-pay | Admitting: Family Medicine

## 2012-11-27 DIAGNOSIS — R29898 Other symptoms and signs involving the musculoskeletal system: Secondary | ICD-10-CM

## 2012-12-05 ENCOUNTER — Ambulatory Visit
Admission: RE | Admit: 2012-12-05 | Discharge: 2012-12-05 | Disposition: A | Discharge status: Home / Self Care | Payer: BC Managed Care – PPO | Source: Ambulatory Visit | Attending: Obstetrics and Gynecology | Admitting: Obstetrics and Gynecology

## 2012-12-05 DIAGNOSIS — R29898 Other symptoms and signs involving the musculoskeletal system: Secondary | ICD-10-CM

## 2012-12-30 ENCOUNTER — Encounter: Payer: Self-pay | Admitting: Neurology

## 2012-12-31 ENCOUNTER — Encounter: Payer: Self-pay | Admitting: Neurology

## 2012-12-31 ENCOUNTER — Ambulatory Visit (INDEPENDENT_AMBULATORY_CARE_PROVIDER_SITE_OTHER): Payer: BC Managed Care – PPO | Admitting: Neurology

## 2012-12-31 VITALS — BP 107/75 | HR 79 | Ht 64.0 in | Wt 152.5 lb

## 2012-12-31 DIAGNOSIS — R29898 Other symptoms and signs involving the musculoskeletal system: Secondary | ICD-10-CM | POA: Insufficient documentation

## 2012-12-31 DIAGNOSIS — M6281 Muscle weakness (generalized): Secondary | ICD-10-CM

## 2012-12-31 NOTE — Progress Notes (Signed)
Reason for visit: Arm weakness  Katherine Potts is a 52 y.o. female  History of present illness:  Katherine Potts is a 52 year old right-handed white female with a history of progressive left arm and hand weakness that began approximately one month ago. The patient has some discomfort in the neck and shoulder and achy sensations down the left arm. The patient also reports some cramping in the calf muscles bilaterally, generally occurring at nighttime. The patient indicates that she is losing the function of her left hand, having difficulty with dropping things. Again, the patient reports no numbness in either upper or lower extremities. The patient does have some stress incontinence of the bladder, but she has not had any alteration in bladder function recently. The patient reports some mild gait instability. The patient denies any pain down the left arm with any particular head or neck movement. The patient denies problems with the right arm. Physical therapy was done, but this did not help her symptoms. The patient has undergone MRI evaluation of the cervical spine with some spondylosis at the C5-6 level with mild spinal stenosis, and possible neuroforaminal stenosis bilaterally at that level. The patient is sent to this office for an evaluation. MRI of the brain apparently has been ordered, not yet done.  Past Medical History  Diagnosis Date  . GERD (gastroesophageal reflux disease)   . Anxiety   . Depression   . MVP (mitral valve prolapse)   . Cervical spondylosis     Past Surgical History  Procedure Laterality Date  . Eye surgery    . Breast surgery    . Vagina surgery      mesh  . Kidney donation      Right nephrectomy  . Abdominal hernia repair      Family History  Problem Relation Age of Onset  . Bipolar disorder Brother   . Dementia Mother   . Hypertension Father     Social history:  reports that she has been smoking Cigarettes and Cigars.  She has been smoking about  0.40 packs per day. She has never used smokeless tobacco. She reports that she drinks alcohol. She reports that she uses illicit drugs (Marijuana).  Medications:  Current Outpatient Prescriptions on File Prior to Visit  Medication Sig Dispense Refill  . Calcium Carb-Cholecalciferol (CALCIUM 1000 + D PO) Take by mouth.      . calcium carbonate (TUMS - DOSED IN MG ELEMENTAL CALCIUM) 500 MG chewable tablet Chew 1 tablet by mouth daily.      . cyclobenzaprine (FLEXERIL) 5 MG tablet Take 5 mg by mouth 3 (three) times daily as needed for muscle spasms.      Marland Kitchen esomeprazole (NEXIUM) 40 MG capsule Take 40 mg by mouth daily before breakfast.      . Flaxseed, Linseed, (FLAX SEED OIL) 1300 MG CAPS Take by mouth.      . folic acid (FOLVITE) 1 MG tablet Take 1 mg by mouth daily.      Marland Kitchen LORazepam (ATIVAN) 1 MG tablet Take 1 mg by mouth every 6 (six) hours as needed.      Marland Kitchen FLUoxetine (PROZAC) 40 MG capsule Take 40 mg by mouth daily.       No current facility-administered medications on file prior to visit.      Allergies  Allergen Reactions  . Bactrim [Sulfamethoxazole-Trimethoprim] Nausea Only    ROS:  Out of a complete 14 system review of symptoms, the patient complains only of the following symptoms,  and all other reviewed systems are negative.  Fatigue Joint pain, achy muscles Runny nose Headache, weakness Depression, anxiety  Blood pressure 107/75, pulse 79, height 5\' 4"  (1.626 m), weight 152 lb 8 oz (69.174 kg).  Physical Exam  General: The patient is alert and cooperative at the time of the examination.  Head: Pupils are equal, round, and reactive to light. Discs are flat bilaterally.  Neck: The neck is supple, no carotid bruits are noted.  Respiratory: The respiratory examination is clear.  Cardiovascular: The cardiovascular examination reveals a regular rate and rhythm, no obvious murmurs or rubs are noted.  Skin: Extremities are without significant edema. Atrophy is seen in  the first dorsal interosseus muscle on the left. No fasciculations are seen.  Neurologic Exam  Mental status: The patient is alert and oriented x 3.  Cranial nerves: Facial symmetry is present. There is good sensation of the face to pinprick and soft touch bilaterally. The strength of the facial muscles and the muscles to head turning and shoulder shrug are normal bilaterally. Speech is well enunciated, no aphasia or dysarthria is noted. Extraocular movements are full. Visual fields are full.  Motor: The motor testing reveals 5 over 5 strength of all 4 extremities, with exception that the patient has significant weakness of the intrinsic muscles of the hands, left APB muscle, and with supination of the left arm.Peri Jefferson symmetric motor tone is noted throughout.  Sensory: Sensory testing is intact to pinprick, soft touch, vibration sensation, and position sense on all 4 extremities. No evidence of extinction is noted.  Coordination: Cerebellar testing reveals good finger-nose-finger and heel-to-shin bilaterally.  Gait and station: Gait is normal. Tandem gait is normal. Romberg is negative. No drift is seen.  Reflexes: Deep tendon reflexes are symmetric, but the patient is hyperreflexic bilaterally. Toes are upgoing bilaterally.   Assessment/Plan:  1. Left upper extremity weakness  2. Hyperreflexia  Clinical examination shows upper and lower motor neuron signs. The patient has atrophy and weakness of the intrinsic muscles of the left hand, without sensory alteration. The patient has hyperreflexia that is relatively diffuse, and a left greater than right Babinski. The MRI of the cervical spine does not explain her current symptoms. 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 The patient will be set up for nerve conduction studies of both upper extremities and the left lower extremity, and EMG evaluations of the left upper and left lower extremity, also  including some cranial nerve innervated muscles. Further management will be done depending upon the results of the above. The patient will followup with EMG evaluation.  Marlan Palau MD 12/31/2012 6:20 PM  [facility] [address] [place], [place] [address]  Phone [phone] Fax [phone]

## 2013-01-01 ENCOUNTER — Other Ambulatory Visit: Payer: Self-pay

## 2013-01-08 ENCOUNTER — Ambulatory Visit (INDEPENDENT_AMBULATORY_CARE_PROVIDER_SITE_OTHER): Payer: BC Managed Care – PPO | Admitting: Neurology

## 2013-01-08 ENCOUNTER — Encounter: Payer: Self-pay | Admitting: Neurology

## 2013-01-08 ENCOUNTER — Encounter (INDEPENDENT_AMBULATORY_CARE_PROVIDER_SITE_OTHER): Payer: Self-pay

## 2013-01-08 DIAGNOSIS — R29898 Other symptoms and signs involving the musculoskeletal system: Secondary | ICD-10-CM

## 2013-01-08 DIAGNOSIS — G1221 Amyotrophic lateral sclerosis: Secondary | ICD-10-CM

## 2013-01-08 DIAGNOSIS — Z0289 Encounter for other administrative examinations: Secondary | ICD-10-CM

## 2013-01-08 HISTORY — DX: Amyotrophic lateral sclerosis: G12.21

## 2013-01-08 MED ORDER — RILUZOLE 50 MG PO TABS: 50.0000 mg | ORAL_TABLET | Freq: Two times a day (BID) | ORAL | Status: DC

## 2013-01-08 NOTE — Progress Notes (Signed)
Katherine Potts is a 52 year old patient with a one to two-month history of onset of weakness and atrophy involving the left hand and arm. The patient has been found to have hyperreflexia on clinical examination, with a left-sided Babinski. Cervical spine MRI does not show an etiology of her clinical symptoms. The patient comes in for EMG and nerve conduction study evaluation.  Nerve conduction studies of both arms, left leg were unremarkable. EMG evaluation of the left leg was essentially normal, left arm evaluation shows diffuse fasciculations, denervation. The left upper trapezius and left frontalis muscle also showed fasciculations.  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 The patient will be started on [place], and she will followup in about 3 months. I have indicated that she is to initiate disability application. The lack of giant motor units on EMG suggests a rapid progression of the disease. The patient indicates a first cousin also had ALS.

## 2013-01-08 NOTE — Procedures (Signed)
HISTORY:  Katherine Potts is a 52 year old patient with a one to two-month history of onset of weakness and atrophy involving the left hand and arm. The patient been noted to have hyperreflexia as well. The patient is being evaluated for a possible neuropathy or an anterior horn cell disease process.  NERVE CONDUCTION STUDIES:  Nerve conduction studies were performed on both upper extremities. The distal motor latencies and motor amplitudes for the median and ulnar nerves were within normal limits. The F wave latencies and nerve conduction velocities for these nerves were also normal. The sensory latencies for the median and ulnar nerves were normal.  Nerve conduction studies were performed on the left lower extremity. The distal motor latencies and motor amplitudes for the peroneal and posterior tibial nerves were within normal limits. The nerve conduction velocities for these nerves were also normal. The sensory latency for the peroneal nerve was within normal limits.   EMG STUDIES:  EMG study was performed on the left upper extremity:  The first dorsal interosseous muscle reveals 2 to 4 K units with markedly reduced recruitment. 2+ fibrillations and positive waves were noted. The abductor pollicis brevis muscle reveals 2 to 4 K units with decreased recruitment. One plus fibrillations and positive waves were noted. 2+ fasciculations were seen. The extensor indicis proprius muscle reveals 2 to 4 K units with full recruitment. One plus fibrillations and positive waves were noted. 2+ fasciculations were seen. The pronator teres muscle reveals 2 to 3 K units with full recruitment. One plus fibrillations and positive waves were noted. 3+ fasciculations were seen. The biceps muscle reveals 1 to 2 K units with full recruitment. One plus fibrillations and positive waves were noted. 2+ fasciculations were seen. The triceps muscle reveals 2 to 4 K units with full recruitment. No fibrillations or  positive waves were noted. The anterior deltoid muscle reveals 2 to 3 K units with full recruitment. No fibrillations or positive waves were noted. 2+ fasciculations were seen. The cervical paraspinal muscles were tested at 2 levels. No abnormalities of insertional activity were seen the upper level tested. On the lower level, one plus fasciculations were seen. No fibrillations or positive waves were noted. There was good relaxation.  EMG study was performed on the left lower extremity:  The tibialis anterior muscle reveals 2 to 4K motor units with full recruitment. No fibrillations or positive waves were seen. The peroneus tertius muscle reveals 2 to 4K motor units with full recruitment. No fibrillations or positive waves were seen. The medial gastrocnemius muscle reveals 1 to 3K motor units with full recruitment. No fibrillations or positive waves were seen. The vastus lateralis muscle reveals 2 to 4K motor units with full recruitment. No fibrillations or positive waves were seen. The iliopsoas muscle reveals 2 to 4K motor units with full recruitment. No fibrillations or positive waves were seen. The biceps femoris muscle (long head) reveals 2 to 4K motor units with full recruitment. No fibrillations or positive waves were seen. The lumbosacral paraspinal muscles were tested at 3 levels, and revealed no abnormalities of insertional activity at the upper level tested. On the middle levels, 1+ fasciculations were seen. On the lower level, 2+ fibrillations and positive waves were noted. There was good relaxation.  Some of the cranial nerve innervated muscles were also tested:  The left upper trapezius muscle reveals 2 to 3 K motor units with full recruitment. No fibrillations or positive waves were noted. 2+ fasciculations were seen. The left frontalis muscle  reveals .5 to 1 K units with full recruitment. No fibrillations or positive waves were noted. One plus fasciculations were seen. The left  orbicularis oris muscle reveals .5 to 1 K motor units with full treatment. No fibrillations or positive waves were noted.   IMPRESSION:  Nerve conduction studies done on both upper extremities and on the left lower extremity were unremarkable. No evidence of a peripheral neuropathy is seen. EMG evaluation of the left lower extremity was essentially unremarkable, with the exception of some mild denervation in the lumbosacral paraspinal muscles. EMG of the left upper extremity shows diffuse primarily acute denervation, and prominent fasciculations in most muscles tested. EMG of the left upper trapezius and left frontalis muscle also showed fasciculations. 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 The lack of giant motor units is likely a manifestation of a rapid rate of progression of this disease process.  Marlan Palau MD 01/08/2013 3:24 PM  [facility] [address] [place], [place] [address]  Phone [phone] Fax [phone]

## 2013-01-14 ENCOUNTER — Telehealth: Payer: Self-pay | Admitting: Neurology

## 2013-01-14 MED ORDER — BACLOFEN 10 MG PO TABS: ORAL_TABLET | ORAL | Status: DC

## 2013-01-14 NOTE — Telephone Encounter (Signed)
I called patient. The patient is having some spasms in the low back, I will try a low dose of the baclofen.

## 2013-01-14 NOTE — Telephone Encounter (Signed)
Patient is request Baclofen which is not on her medication list. Please advise.

## 2013-01-15 ENCOUNTER — Other Ambulatory Visit: Payer: Self-pay | Admitting: Neurology

## 2013-01-15 ENCOUNTER — Encounter: Payer: BC Managed Care – PPO | Admitting: Neurology

## 2013-01-23 LAB — HEAVY METALS SCREEN, URINE
Creatinine(Crt),U: 0.86 g/L (ref 0.30–3.00)
Lead, Rand Ur: NOT DETECTED ug/L (ref 0–49)
Mercury, Ur: 1 ug/L (ref 0–19)
Mercury/Creat. Ratio: 1 ug/g creat (ref 0–5)

## 2013-01-26 LAB — IFE AND PE, SERUM
Albumin SerPl Elph-Mcnc: 3.9 g/dL (ref 3.2–5.6)
Alpha 1: 0.2 g/dL (ref 0.1–0.4)
Alpha2 Glob SerPl Elph-Mcnc: 0.7 g/dL (ref 0.4–1.2)
Gamma Glob SerPl Elph-Mcnc: 1.1 g/dL (ref 0.5–1.6)
IgA/Immunoglobulin A, Serum: 139 mg/dL (ref 91–414)
IgM (Immunoglobulin M), Srm: 340 mg/dL — ABNORMAL HIGH (ref 40–230)
Total Protein: 6.9 g/dL (ref 6.0–8.5)

## 2013-01-26 LAB — GM1 ANTIBODY IGG, IGM: GM 1 IgM: <1:100 {titer}

## 2013-01-26 LAB — PAN-ANCA
ANCA Proteinase 3: <3.5 U/mL (ref 0.0–3.5)
C-ANCA: <1:20 {titer}
Myeloperoxidase Ab: <9 U/mL (ref 0.0–9.0)
P-ANCA: <1:20 {titer}

## 2013-01-26 LAB — SEDIMENTATION RATE: Sed Rate: 22 mm/hr (ref 0–40)

## 2013-02-09 ENCOUNTER — Emergency Department (HOSPITAL_COMMUNITY)
Admission: EM | Admit: 2013-02-09 | Discharge: 2013-02-10 | Disposition: A | Discharge status: Home / Self Care | Payer: BC Managed Care – PPO | Attending: Emergency Medicine | Admitting: Emergency Medicine

## 2013-02-09 DIAGNOSIS — M549 Dorsalgia, unspecified: Secondary | ICD-10-CM | POA: Insufficient documentation

## 2013-02-09 DIAGNOSIS — B9789 Other viral agents as the cause of diseases classified elsewhere: Secondary | ICD-10-CM

## 2013-02-09 DIAGNOSIS — F3289 Other specified depressive episodes: Secondary | ICD-10-CM | POA: Insufficient documentation

## 2013-02-09 DIAGNOSIS — Z8679 Personal history of other diseases of the circulatory system: Secondary | ICD-10-CM | POA: Insufficient documentation

## 2013-02-09 DIAGNOSIS — J069 Acute upper respiratory infection, unspecified: Secondary | ICD-10-CM | POA: Insufficient documentation

## 2013-02-09 DIAGNOSIS — Z79899 Other long term (current) drug therapy: Secondary | ICD-10-CM | POA: Insufficient documentation

## 2013-02-09 DIAGNOSIS — F17200 Nicotine dependence, unspecified, uncomplicated: Secondary | ICD-10-CM | POA: Insufficient documentation

## 2013-02-09 DIAGNOSIS — Z8669 Personal history of other diseases of the nervous system and sense organs: Secondary | ICD-10-CM | POA: Insufficient documentation

## 2013-02-09 DIAGNOSIS — K219 Gastro-esophageal reflux disease without esophagitis: Secondary | ICD-10-CM | POA: Insufficient documentation

## 2013-02-09 DIAGNOSIS — F329 Major depressive disorder, single episode, unspecified: Secondary | ICD-10-CM | POA: Insufficient documentation

## 2013-02-09 DIAGNOSIS — F411 Generalized anxiety disorder: Secondary | ICD-10-CM | POA: Insufficient documentation

## 2013-02-10 ENCOUNTER — Emergency Department (HOSPITAL_COMMUNITY): Payer: BC Managed Care – PPO

## 2013-02-10 ENCOUNTER — Encounter (HOSPITAL_COMMUNITY): Payer: Self-pay | Admitting: Emergency Medicine

## 2013-02-10 LAB — BASIC METABOLIC PANEL
CO2: 26 mEq/L (ref 19–32)
Calcium: 9.7 mg/dL (ref 8.4–10.5)
Creatinine, Ser: 1.26 mg/dL — ABNORMAL HIGH (ref 0.50–1.10)
GFR calc Af Amer: 56 mL/min — ABNORMAL LOW (ref >90)
GFR calc non Af Amer: 48 mL/min — ABNORMAL LOW (ref >90)
Glucose, Bld: 95 mg/dL (ref 70–99)
Potassium: 4 mEq/L (ref 3.5–5.1)
Sodium: 134 mEq/L — ABNORMAL LOW (ref 135–145)

## 2013-02-10 LAB — CBC
HCT: 39 % (ref 36.0–46.0)
Hemoglobin: 13.1 g/dL (ref 12.0–15.0)
MCH: 33 pg (ref 26.0–34.0)
MCHC: 33.6 g/dL (ref 30.0–36.0)
MCV: 98.2 fL (ref 78.0–100.0)
Platelets: 216 10*3/uL (ref 150–400)
RBC: 3.97 MIL/uL (ref 3.87–5.11)
RDW: 13.6 % (ref 11.5–15.5)

## 2013-02-10 MED ORDER — METHYLPREDNISOLONE SODIUM SUCC 125 MG IJ SOLR
125.0000 mg | Freq: Once | INTRAMUSCULAR | Status: AC
  Administered 2013-02-10: 125 mg via INTRAMUSCULAR
  Filled 2013-02-10: qty 2

## 2013-02-10 MED ORDER — FENTANYL CITRATE 0.05 MG/ML IJ SOLN
50.0000 ug | INTRAMUSCULAR | Status: DC | PRN
  Filled 2013-02-10: qty 2

## 2013-02-10 MED ORDER — OXYCODONE-ACETAMINOPHEN 5-325 MG PO TABS
1.0000 | ORAL_TABLET | Freq: Once | ORAL | Status: AC
  Administered 2013-02-10: 1 via ORAL
  Filled 2013-02-10: qty 1

## 2013-02-10 MED ORDER — SODIUM CHLORIDE 0.9 % IV SOLN: INTRAVENOUS | Status: DC

## 2013-02-10 MED ORDER — METHYLPREDNISOLONE SODIUM SUCC 125 MG IJ SOLR: 125.0000 mg | Freq: Once | INTRAMUSCULAR | Status: DC

## 2013-02-10 MED ORDER — OSELTAMIVIR PHOSPHATE 75 MG PO CAPS: 75.0000 mg | ORAL_CAPSULE | Freq: Two times a day (BID) | ORAL | Status: DC

## 2013-02-10 MED ORDER — ALBUTEROL SULFATE HFA 108 (90 BASE) MCG/ACT IN AERS
2.0000 | INHALATION_SPRAY | RESPIRATORY_TRACT | Status: DC | PRN
  Administered 2013-02-10: 2 via RESPIRATORY_TRACT
  Filled 2013-02-10: qty 6.7

## 2013-02-10 MED ORDER — PREDNISONE 20 MG PO TABS: 60.0000 mg | ORAL_TABLET | Freq: Once | ORAL | Status: DC

## 2013-02-10 MED ORDER — ONDANSETRON HCL 4 MG/2ML IJ SOLN
4.0000 mg | Freq: Once | INTRAMUSCULAR | Status: DC
  Filled 2013-02-10: qty 2

## 2013-02-10 MED ORDER — OXYCODONE-ACETAMINOPHEN 5-325 MG PO TABS: 2.0000 | ORAL_TABLET | ORAL | Status: DC | PRN

## 2013-02-10 NOTE — ED Notes (Addendum)
PT reports hx ALS, has been having flu-like symptoms with a cough, states Friday she had a hard cough and felt a pop to her R ribs, then was feeling better until tonight when she again had a hard cough and felt a pop to her L ribs and states she has continued to be ShOB. Pt reports taking Mucinex, neb treatments and has been using her incentive spirometer at home without relief.

## 2013-02-10 NOTE — ED Provider Notes (Signed)
CSN: [id number]     Arrival date & time 02/09/13  2354 History   First MD Initiated Contact with Patient 02/10/13 0153     Chief Complaint  Patient presents with  . Shortness of Breath   (Consider location/radiation/quality/duration/timing/severity/associated sxs/prior Treatment) HPI HX per PT  - has ALS and daily neck and back pain, started coughing a week ago.  No F/C. Son has similar symptoms. Did get a flu shot this year.  Now she is having worse back and rib pain that she attributes to coughing. She has taken Percocet in the past and is requesting the same. No abdominal pain. No nausea vomiting. No rash. No recent travel. Symptoms moderate in severity. Past Medical History  Diagnosis Date  . GERD (gastroesophageal reflux disease)   . Anxiety   . Depression   . MVP (mitral valve prolapse)   . Cervical spondylosis   . 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 Eye surgery    . Breast surgery    . Vagina surgery      mesh  . Kidney donation      Right nephrectomy  . Abdominal hernia repair     Family History  Problem Relation Age of Onset  . Bipolar disorder Brother   . Dementia Mother   . Hypertension Father   . ALS Cousin    History  Substance Use Topics  . Smoking status: Current Every Day Smoker -- 0.40 packs/day    Types: Cigarettes, Cigars  . Smokeless tobacco: Never Used  . Alcohol Use: Yes     Comment: sociallly   OB History   Grav Para Term Preterm Abortions TAB SAB Ect Mult Living                 Review of Systems  Constitutional: Negative for fever and chills.  Respiratory: Positive for cough and wheezing.   Cardiovascular: Negative for palpitations and leg swelling.  Gastrointestinal: Negative for abdominal pain.  Genitourinary: Negative for dysuria.  Musculoskeletal: Negative for back pain, neck pain and neck stiffness.  Skin: Negative for rash.  Neurological: Negative for headaches.   All other systems reviewed and are negative.    Allergies  Bactrim  Home Medications   Current Outpatient Rx  Name  Route  Sig  Dispense  Refill  . baclofen (LIORESAL) 10 MG tablet   Oral   Take 10 mg by mouth daily as needed for muscle spasms.         . Calcium Carb-Cholecalciferol (CALCIUM 1000 + D PO)   Oral   Take by mouth.         . calcium carbonate (TUMS - DOSED IN MG ELEMENTAL CALCIUM) 500 MG chewable tablet   Oral   Chew 1 tablet by mouth daily.         . DULoxetine (CYMBALTA) 30 MG capsule   Oral   Take 30 mg by mouth at bedtime.         Marland Kitchen esomeprazole (NEXIUM) 40 MG capsule   Oral   Take 40 mg by mouth daily before breakfast.         . Flaxseed, Linseed, (FLAX SEED OIL) 1300 MG CAPS   Oral   Take by mouth.         . folic acid (FOLVITE) 1 MG tablet   Oral   Take 1 mg by mouth daily.         Marland Kitchen LORazepam (ATIVAN) 1  MG tablet   Oral   Take 0.5-1 mg by mouth every 6 (six) hours as needed for anxiety.          . vitamin C (ASCORBIC ACID) 500 MG tablet   Oral   Take 500 mg by mouth daily.          BP 132/84  Pulse 93  Temp(Src) 97.8 F (36.6 C) (Oral)  Resp 21  SpO2 98% Physical Exam  Constitutional: She is oriented to person, place, and time. She appears well-developed and well-nourished.  HENT:  Head: Normocephalic and atraumatic.  Eyes: EOM are normal. Pupils are equal, round, and reactive to light.  Neck: Neck supple.  Cardiovascular: Normal rate, regular rhythm and intact distal pulses.   Pulmonary/Chest: Effort normal.  Decreased bilateral breath sounds with some expiratory wheezes. some tenderness over bilateral lower ribs without crepitus or rash  Abdominal: Soft. Bowel sounds are normal. She exhibits no distension. There is no tenderness. There is no rebound and no guarding.  Musculoskeletal: Normal range of motion. She exhibits no edema and no tenderness.  Neurological: She is alert and oriented to person, place, and  time.  Skin: Skin is warm and dry.    ED Course  Procedures (including critical care time) Labs Review Labs Reviewed  BASIC METABOLIC PANEL - Abnormal; Notable for the following:    Sodium 134 (*)    Creatinine, Ser 1.26 (*)    GFR calc non Af Amer 48 (*)    GFR calc Af Amer 56 (*)    All other components within normal limits  CBC   Imaging Review Dg Chest 2 View (if Patient Has Fever And/or Copd)  02/10/2013   CLINICAL DATA:  Shortness of breath and right chest pain.  EXAM: CHEST  2 VIEW  COMPARISON:  None.  FINDINGS: No acute infiltrate or edema. No effusion or pneumothorax. Normal heart size.  IMPRESSION: No active cardiopulmonary disease.   Electronically Signed   By: Tiburcio Pea M.D.   On: 02/10/2013 00:51   Percocet provided. Solu-Medrol and albuterol inhaler provided. Recheck symptomatically improved with clear lung sounds.  plan discharge home with prescription for Tamiflu and Percocet as needed. She agrees to followup with her primary care physician and return here for any worsening or concerning symptoms.  MDM  Diagnosis: Viral infection  Chest x-ray labs reviewed as above mild elevated creatinine. Patient agrees to have this rechecked.  Symptomatically improved with medications provided. Vital signs and nursing notes reviewed and considered.    Sunnie Nielsen, MD 02/10/13 [phone]

## 2013-03-06 ENCOUNTER — Telehealth: Payer: Self-pay | Admitting: Neurology

## 2013-03-06 NOTE — Telephone Encounter (Signed)
ALTERNATE #[phone]--PT BEING TREATED @ [facility]--HAVING NECK AND BACK SPASMS-NEEDS SOON APPT--ALREADY SCHEDULED FOR FEBRUARY-PLEASE CALL,

## 2013-03-06 NOTE — Telephone Encounter (Signed)
Called patient to inform her that Dr. Anne HahnWillis was booked and that I gave her message to the Dr.'s assistant Lamar LaundrySonya and if there was an cancellatio that his assistant would contact her to let her know. Patient verbalized understanding.

## 2013-03-10 ENCOUNTER — Telehealth: Payer: Self-pay | Admitting: *Deleted

## 2013-03-10 NOTE — Telephone Encounter (Signed)
Called to offer appt today 1/13 @ 1:30. Left message, patient has appointment on 04/10/13.

## 2013-04-10 ENCOUNTER — Ambulatory Visit (INDEPENDENT_AMBULATORY_CARE_PROVIDER_SITE_OTHER): Payer: Medicare Other | Admitting: Neurology

## 2013-04-10 ENCOUNTER — Encounter: Payer: Self-pay | Admitting: Neurology

## 2013-04-10 VITALS — BP 111/71 | HR 73 | Wt 148.0 lb

## 2013-04-10 DIAGNOSIS — M6281 Muscle weakness (generalized): Secondary | ICD-10-CM

## 2013-04-10 DIAGNOSIS — R29898 Other symptoms and signs involving the musculoskeletal system: Secondary | ICD-10-CM

## 2013-04-10 DIAGNOSIS — G1221 Amyotrophic lateral sclerosis: Secondary | ICD-10-CM

## 2013-04-10 MED ORDER — TIZANIDINE HCL 2 MG PO TABS: 2.0000 mg | ORAL_TABLET | Freq: Three times a day (TID) | ORAL | Status: DC

## 2013-04-10 NOTE — Patient Instructions (Signed)
Amyotrophic Lateral Sclerosis Amyotrophic Lateral Sclerosis (ALS) is a disease of the nervous system. It is also known as Lou Gehrig's disease. It has no known cause.  SYMPTOMS  ALS shows up (presents) with the loss of nerve cells.  It tends to strike the movement (motor) nerves. These are the nerves that control your muscles.  The nerves used for feeling (sensation) are usually not affected.  With this disease there is continuing (progressive) loss of muscle control and weakness of most muscles. This is most noticeable in the arms and legs.  Later in the disease there is weakness of the facial muscles. Also, there are problems with swallowing and breathing. The voice may also become affected because of problems with weakness. The thinking ability, movements of the eyes, feeling sensations and circular muscles (sphincters) that help control bowels and urination all continue to work. DIAGNOSIS  The diagnosis of ALS is usually made by observing the physical findings (clinical presentation). X-ray and lab studies may be done to support treatment of possible complications. ALS is a disease with no known cause. At this time, there is no known treatment or cure for the disease. It is progressive. So, the muscle weakness will get worse with time. The disease usually ends with respiratory failure since the body is no longer able to breathe. The muscles which keep you breathing are no longer strong enough to do their job. The muscles which control the rib cage become too weak to provide adequate breathing (respiration).  FOR MORE INFORMATION ALS Association: www.alsa.org National Institute of Neurological Disorders and Stroke: www.ninds.nih.gov/disorders Document Released: 02/06/2001 Document Revised: 05/07/2011 Document Reviewed: 02/12/2005 [employer] Patient Information 2014 [employer].  

## 2013-04-10 NOTE — Progress Notes (Signed)
Reason for visit: ALS  Katherine Potts is an 53 y.o. 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 The patient has had a first cousin with ALS with frontotemporal dementia. The patient has continued to have ongoing progressive weakness primarily of the left hand, but there is some weakness as well on the right. The patient is having difficulty using the hand. The patient reports a lot of spasms and neuromuscular discomfort in the neck and shoulder area. The use of baclofen did not help. The patient has occasional spasms in the legs. The patient [facility] by Dr. Jacalyn Lefevrearress. The patient was given a prescription for Rilutek, but this was too expensive. The patient decided not to use this. The patient has had an occasional fall. The patient returns to this office for an evaluation. The patient is eating and drinking well, no problems with swallowing.   Past Medical History  Diagnosis Date  . GERD (gastroesophageal reflux disease)   . Anxiety   . Depression   . MVP (mitral valve prolapse)   . Cervical spondylosis   . 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 Eye surgery    . Breast surgery    . Vagina surgery      mesh  . Kidney donation      Right nephrectomy  . Abdominal hernia repair      Family History  Problem Relation Age of Onset  . Bipolar disorder Brother   . Dementia Mother   . Hypertension Father   . ALS Cousin     Social history:  reports that she has been smoking Cigarettes and Cigars.  She has been smoking about 0.40 packs per day. She has never used smokeless tobacco. She reports that she drinks alcohol. She reports that she uses illicit drugs (Marijuana).    Allergies  Allergen Reactions  . Bactrim  [Sulfamethoxazole-Trimethoprim] Nausea Only    Medications:  Current Outpatient Prescriptions on File Prior to Visit  Medication Sig Dispense Refill  . Calcium Carb-Cholecalciferol (CALCIUM 1000 + D PO) Take by mouth.      . calcium carbonate (TUMS - DOSED IN MG ELEMENTAL CALCIUM) 500 MG chewable tablet Chew 1 tablet by mouth daily.      Marland Kitchen. esomeprazole (NEXIUM) 40 MG capsule Take 40 mg by mouth daily before breakfast.      . Flaxseed, Linseed, (FLAX SEED OIL) 1300 MG CAPS Take by mouth.      . folic acid (FOLVITE) 1 MG tablet Take 1 mg by mouth daily.      Marland Kitchen. LORazepam (ATIVAN) 1 MG tablet Take 0.5-1 mg by mouth every 6 (six) hours as needed for anxiety.       Marland Kitchen. oxyCODONE-acetaminophen (PERCOCET/ROXICET) 5-325 MG per tablet Take 2 tablets by mouth every 4 (four) hours as needed for severe pain.  15 tablet  0  . vitamin C (ASCORBIC ACID) 500 MG tablet Take 500 mg by mouth daily.       No current facility-administered medications on file prior to visit.    ROS:  Out of a complete 14 system review of symptoms, the patient complains only of the following symptoms, and all other reviewed systems are negative.  Appetite change, fatigue Neck pain, neck stiffness Light  sensitivity Nausea, abdominal pain Incontinence of bladder Joint pain, back pain, achy muscles, muscle cramps, coordination problems Memory loss, headache, weakness Anxiety  Blood pressure 111/71, pulse 73, weight 148 lb (67.132 kg).  Physical Exam  General: The patient is alert and cooperative at the time of the examination.  Skin: No significant peripheral edema is noted.   Neurologic Exam  Mental status: The patient is oriented x 3.  Cranial nerves: Facial symmetry is present. Speech is normal, no aphasia or dysarthria is noted. Extraocular movements are full. Visual fields are full.  Motor: The patient has good strength in all 4 extremities, with exception that there is severe intrinsic weakness with the left  hand, mild on the right hand.  Sensory examination: Soft touch sensation is symmetric on the face, arms, and legs.  Coordination: The patient has good finger-nose-finger and heel-to-shin bilaterally.  Gait and station: The patient has a normal gait. Tandem gait is unsteady. Romberg is negative. No drift is seen.  Reflexes: Deep tendon reflexes are symmetric, but are brisk.   Assessment/Plan:  1. ALS, Genetic, C9orf72 mutation  2. 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 The patient continues to have ongoing progression of weakness with the left hand, with some weakness on the right as well. The patient will be set up for an occupational therapy evaluation for possible bracing of the left hand. The patient will be given tizanidine for the neck and shoulder spasms. The patient will followup in 6 months. The patient may enter a research program in the near future.  Marlan Palau MD 04/11/2013 12:15 PM  [facility] [address] [place], [place] [address]  Phone [phone] Fax [phone]

## 2013-04-11 ENCOUNTER — Encounter: Payer: Self-pay | Admitting: Neurology

## 2013-04-28 ENCOUNTER — Ambulatory Visit: Payer: BC Managed Care – PPO | Admitting: Neurology

## 2013-05-25 ENCOUNTER — Telehealth: Payer: Self-pay | Admitting: Neurology

## 2013-05-25 DIAGNOSIS — G1221 Amyotrophic lateral sclerosis: Secondary | ICD-10-CM

## 2013-05-25 MED ORDER — OXYCODONE-ACETAMINOPHEN 5-325 MG PO TABS: 1.0000 | ORAL_TABLET | ORAL | Status: DC | PRN

## 2013-05-25 NOTE — Telephone Encounter (Signed)
Pt calling stating that she has ALS and she is not doing well and would like Dr. Anne HahnWillis to put in a referral to the ALS Clinic at [facility], because she is not happy with the Clinic at [facility]. Please advise

## 2013-05-25 NOTE — Telephone Encounter (Signed)
Pt called and stated that she has ALS and she is not doing well.  She asked if it would be possible for Dr. Anne HahnWillis could refer her to the ALS Clinic at [facility] as she is not happy with the Clinic at [facility].  Please call to discuss.  Thank you

## 2013-05-25 NOTE — Telephone Encounter (Signed)
I called patient. The patient is having significant pain in the neck and shoulder areas. She is not on Rilutek. The patient wants a referral to [facility]. I will get this set up. The patient will see Dr. Zannie KehrBedlack.  The patient indicates that she has a lot of pain, not eating well because of this. The patient will be given a prescription for oxycodone.

## 2013-05-26 ENCOUNTER — Other Ambulatory Visit: Payer: Self-pay | Admitting: Neurology

## 2013-05-26 NOTE — Telephone Encounter (Signed)
Called pt and left message informing her that her Rx was ready to be picked up at the front desk and if she has any other problems, questions or concerns to call the office.  °

## 2013-05-29 ENCOUNTER — Telehealth: Payer: Self-pay | Admitting: Neurology

## 2013-05-29 ENCOUNTER — Other Ambulatory Visit: Payer: Self-pay | Admitting: Neurology

## 2013-05-29 DIAGNOSIS — G1221 Amyotrophic lateral sclerosis: Secondary | ICD-10-CM

## 2013-05-29 NOTE — Telephone Encounter (Signed)
Joni Reiningicole from [facility] returning Cathy's call regarding patent's referral, she says to please call this number to make the referral: [phone].

## 2013-05-29 NOTE — Telephone Encounter (Signed)
Dr. Anne HahnWillis was given this information and the doctor stated that he would give the pt a call.

## 2013-05-29 NOTE — Telephone Encounter (Signed)
Spoke with patient. The patient indicates that she does have oxycodone for pain, does not want anything else at this time. The patient will be going to a pain Center. A prescription was given for a soft cervical collar and for a lift chair.

## 2013-05-29 NOTE — Telephone Encounter (Signed)
Patient calling to request script for lift chair, cervial collar, and traction unit. Please call and advise patient.

## 2013-06-02 ENCOUNTER — Telehealth: Payer: Self-pay | Admitting: *Deleted

## 2013-06-02 NOTE — Telephone Encounter (Signed)
[facility] called re: did receive information and have placed call to pt re: appt.  [phone] Lorain Childes(FYI)

## 2013-06-04 ENCOUNTER — Telehealth: Payer: Self-pay | Admitting: Neurology

## 2013-06-04 NOTE — Telephone Encounter (Signed)
I'll try to get an earlier appointment.

## 2013-06-04 NOTE — Telephone Encounter (Signed)
Patient's son calling to get a sooner appointment scheduled with Dr. Anne HahnWillis, sooner than the one currently scheduled for 10/08/13. Please call and advise.

## 2013-06-04 NOTE — Telephone Encounter (Signed)
Left message to see how the patient was doing or if she was having any problems.  Told her we would get her in sooner and asked her to call back to discuss.

## 2013-06-05 ENCOUNTER — Telehealth: Payer: Self-pay | Admitting: *Deleted

## 2013-06-05 MED ORDER — OXYCODONE HCL ER 15 MG PO T12A: 15.0000 mg | EXTENDED_RELEASE_TABLET | Freq: Two times a day (BID) | ORAL | Status: DC

## 2013-06-05 NOTE — Telephone Encounter (Signed)
Pt called in and stated that she needs to get an appointment for a palliative care consult. She asked if there would be any time on Monday for her to come in.  She also said that she is having trouble with the oxyCODONE-acetaminophen (PERCOCET/ROXICET) 5-325 MG per tablet.  Please reference telephone message dated 06-05-13.  Thank you

## 2013-06-05 NOTE — Telephone Encounter (Signed)
Called patient to offer May appt to replace June appt.

## 2013-06-05 NOTE — Telephone Encounter (Signed)
I called patient. I talked with the family. The patient is having ongoing breakthrough pain with the Percocet. I will place her on OxyContin taking 15 mg twice daily. She can use the Percocet for breakthrough pain. She wants a palliative care consult, but I believe this is only an inpatient service. A hospice referral at some point may be more appropriate.

## 2013-06-05 NOTE — Telephone Encounter (Signed)
If the patient believes she sees be seen sooner, we can work her in within the next couple weeks.

## 2013-06-05 NOTE — Telephone Encounter (Signed)
Called patient to schedule appointment for May 2015. Patient states she can't wait that long, she needs Pallative Care Counseling. Let me know when to schedule her.

## 2013-06-06 ENCOUNTER — Telehealth: Payer: Self-pay | Admitting: Neurology

## 2013-06-06 NOTE — Telephone Encounter (Signed)
Received call from patient stating that she has been having severe neck and back spasms for the past one week. Today started developing "larygneal spasms" and shortness of breath. Counseled her that she should proceed to the ED for further evaluation (informed that they are already on their way). Based on conversation patient appeared to be mildly short of breath but able to converse without difficulty, therefore lower concern that she is in acute respiratory distress requiring EMS transport. 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 They will continue to travel via car. Informed [facility] ED triage of pending arrival.

## 2013-06-07 ENCOUNTER — Encounter (HOSPITAL_COMMUNITY): Payer: Self-pay | Admitting: Emergency Medicine

## 2013-06-07 ENCOUNTER — Emergency Department (HOSPITAL_COMMUNITY)
Admission: EM | Admit: 2013-06-07 | Discharge: 2013-06-07 | Disposition: A | Discharge status: Home / Self Care | Payer: Medicare Other | Attending: Emergency Medicine | Admitting: Emergency Medicine

## 2013-06-07 DIAGNOSIS — M5410 Radiculopathy, site unspecified: Secondary | ICD-10-CM

## 2013-06-07 DIAGNOSIS — F411 Generalized anxiety disorder: Secondary | ICD-10-CM | POA: Insufficient documentation

## 2013-06-07 DIAGNOSIS — F329 Major depressive disorder, single episode, unspecified: Secondary | ICD-10-CM | POA: Insufficient documentation

## 2013-06-07 DIAGNOSIS — M5412 Radiculopathy, cervical region: Secondary | ICD-10-CM | POA: Insufficient documentation

## 2013-06-07 DIAGNOSIS — G1221 Amyotrophic lateral sclerosis: Secondary | ICD-10-CM | POA: Insufficient documentation

## 2013-06-07 DIAGNOSIS — K219 Gastro-esophageal reflux disease without esophagitis: Secondary | ICD-10-CM | POA: Insufficient documentation

## 2013-06-07 DIAGNOSIS — F3289 Other specified depressive episodes: Secondary | ICD-10-CM | POA: Insufficient documentation

## 2013-06-07 DIAGNOSIS — F17200 Nicotine dependence, unspecified, uncomplicated: Secondary | ICD-10-CM | POA: Insufficient documentation

## 2013-06-07 DIAGNOSIS — Z79899 Other long term (current) drug therapy: Secondary | ICD-10-CM | POA: Insufficient documentation

## 2013-06-07 DIAGNOSIS — M47812 Spondylosis without myelopathy or radiculopathy, cervical region: Secondary | ICD-10-CM | POA: Insufficient documentation

## 2013-06-07 MED ORDER — ONDANSETRON HCL 4 MG PO TABS: 4.0000 mg | ORAL_TABLET | Freq: Four times a day (QID) | ORAL | Status: DC

## 2013-06-07 MED ORDER — HYDROMORPHONE HCL 2 MG PO TABS
2.0000 mg | ORAL_TABLET | Freq: Once | ORAL | Status: AC
  Administered 2013-06-07: 2 mg via ORAL
  Filled 2013-06-07: qty 1

## 2013-06-07 MED ORDER — HYDROMORPHONE HCL 4 MG PO TABS: 4.0000 mg | ORAL_TABLET | ORAL | Status: DC | PRN

## 2013-06-07 MED ORDER — LACTULOSE SOLN: 10.0000 mL | Freq: Three times a day (TID) | Status: DC

## 2013-06-07 MED ORDER — ONDANSETRON 4 MG PO TBDP
4.0000 mg | ORAL_TABLET | Freq: Once | ORAL | Status: AC
  Administered 2013-06-07: 4 mg via ORAL
  Filled 2013-06-07: qty 1

## 2013-06-07 NOTE — ED Provider Notes (Addendum)
CSN: [id number]     Arrival date & time 06/07/13  0019 History   First MD Initiated Contact with Patient 06/07/13 0033     Chief Complaint  Patient presents with  . 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 She also has cervical spondylosis and chronic radicular neck pain. She is here because she has been taking Percocet for neck pain but feels that it is not helping. She would like something stronger for pain. She says the pain is chronic. It is 10/10, starts in neck and radiates to the left arm and hand. Lambert ModySharp is nature. Patient says that she has some chronic dysphagia and SOB secondary to ALS. She says the neck pain excacerbates both of these sx.   She is not followed by a pain specialist but has an appt pending. She says she needs something for constipation as well and asks for lactulose. Her last BM was 2 days ago.   Past Medical History  Diagnosis Date  . GERD (gastroesophageal reflux disease)   . Anxiety   . Depression   . MVP (mitral valve prolapse)   . Cervical spondylosis   . 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 Eye surgery    . Breast surgery    . Vagina surgery      mesh  . Kidney donation      Right nephrectomy  . Abdominal hernia repair     Family History  Problem Relation Age of Onset  . Bipolar disorder Brother   . Dementia Mother   . Hypertension Father   . ALS Cousin    History  Substance Use Topics  . Smoking status: Current Every Day Smoker -- 0.40 packs/day    Types: Cigarettes, Cigars  . Smokeless tobacco: Never Used  . Alcohol Use: Yes     Comment: sociallly   OB History   Grav Para Term Preterm Abortions TAB SAB Ect Mult Living                 Review of Systems Ten point review of  symptoms performed and is negative with the exception of symptoms noted above and chronic, worsening extremity weakness.     Allergies  Bactrim  Home Medications   Current Outpatient Rx  Name  Route  Sig  Dispense  Refill  . Calcium Carb-Cholecalciferol (CALCIUM 1000 + D PO)   Oral   Take by mouth.         . calcium carbonate (TUMS - DOSED IN MG ELEMENTAL CALCIUM) 500 MG chewable tablet   Oral   Chew 1 tablet by mouth daily.         Marland Kitchen. esomeprazole (NEXIUM) 40 MG capsule   Oral   Take 40 mg by mouth daily before breakfast.         . Flaxseed, Linseed, (FLAX SEED OIL) 1300 MG CAPS   Oral   Take by mouth.         . folic acid (FOLVITE) 1 MG tablet   Oral   Take 1 mg by mouth daily.         Marland Kitchen. LORazepam (ATIVAN) 1 MG tablet   Oral   Take 0.5-1 mg by mouth every 6 (six) hours as needed for anxiety.          .Marland Kitchen  OxyCODONE (OXYCONTIN) 15 mg T12A 12 hr tablet   Oral   Take 1 tablet (15 mg total) by mouth every 12 (twelve) hours.   60 tablet   0   . oxyCODONE-acetaminophen (PERCOCET/ROXICET) 5-325 MG per tablet   Oral   Take 1 tablet by mouth every 4 (four) hours as needed for severe pain.   100 tablet   0   . PARoxetine (PAXIL) 20 MG tablet   Oral   Take 20 mg by mouth daily.         Marland Kitchen tiZANidine (ZANAFLEX) 2 MG tablet   Oral   Take 1 tablet (2 mg total) by mouth 3 (three) times daily.   90 tablet   5   . vitamin C (ASCORBIC ACID) 500 MG tablet   Oral   Take 500 mg by mouth daily.          BP 123/71  Pulse 92  Temp(Src) 97.5 F (36.4 C) (Oral)  Resp 20  SpO2 97% Physical Exam Gen: well developed and well nourished appearing Head: NCAT Eyes: PERL, EOMI Nose: no epistaixis or rhinorrhea Mouth/throat: mucosa is moist and pink Neck: supple, no stridor, no midline ttp Lungs: CTA B, no wheezing, rhonchi or rales CV: RRR, no murmur, extremities appear well perfused.  Abd: soft, notender, nondistended Back: no ttp, no cva ttp Skin: warm and  dry Ext: some muscle wasting noted in hands - left > right Neuro: CN ii-xii grossly intact,  Motor strength diminished both arms (left > right) and legs.  Psyche; flat and, at times, tearful affect,  calm and cooperative.   ED Course  Procedures (including critical care time) Labs Review EKG: nsr, no acute ischemic changes, normal intervals, normal axis, normal qrs complex  MDM   Patient with terminal illness here for management of acute pain. We will d/c Percocet in favor of Dilaudid and add lactulose to prevent constipation. Patient to f/u with ALS Clinic and with Pain management, as scheduled.    Brandt Loosen, MD 06/07/13 1610  Brandt Loosen, MD 06/07/13 0120

## 2013-06-07 NOTE — ED Notes (Signed)
Pt states that she is having neck spasms, SOB, constant. Pt has ALS, and states spasm are making ALS worse. She has taken percocet and ativan with no relief.

## 2013-06-08 ENCOUNTER — Telehealth: Payer: Self-pay | Admitting: *Deleted

## 2013-06-08 NOTE — Telephone Encounter (Signed)
Left a message that the prescription for the Oxycodone is ready to be picked up at the front desk.

## 2013-06-09 ENCOUNTER — Ambulatory Visit: Payer: Medicare Other | Attending: Neurology | Admitting: *Deleted

## 2013-06-09 ENCOUNTER — Telehealth: Payer: Self-pay | Admitting: *Deleted

## 2013-06-09 NOTE — Telephone Encounter (Signed)
Patient is requesting a refill on Lorazepam.  It does not appear we have been prescribing this.  Would you like to write Rx?  Please advise.  Thank you.

## 2013-06-09 NOTE — Telephone Encounter (Signed)
I called the patient. Dr. Evelene CroonKaur is writing a prescription for Ativan. The patient has already talked to her, and the prescription was refilled.

## 2013-06-15 ENCOUNTER — Telehealth: Payer: Self-pay | Admitting: *Deleted

## 2013-06-15 ENCOUNTER — Telehealth: Payer: Self-pay | Admitting: Neurology

## 2013-06-15 DIAGNOSIS — G1221 Amyotrophic lateral sclerosis: Secondary | ICD-10-CM

## 2013-06-15 MED ORDER — BACLOFEN 20 MG PO TABS: 20.0000 mg | ORAL_TABLET | Freq: Two times a day (BID) | ORAL | Status: DC

## 2013-06-15 NOTE — Telephone Encounter (Signed)
Patient also requesting an physical therapy order along with home health care as well.. Thanks

## 2013-06-15 NOTE — Telephone Encounter (Signed)
I called the patient. The patient is having some spasms in the neck and shoulders. She was given baclofen by Dr. Alphonzo Dublinaress. I will call in a prescription, she is taking 20 mg twice daily. The patient wants neuromuscular therapy with physical therapy, and we will get occupational therapy as well. I do not think the patient qualifies for home health physical therapy, as she is still ambulatory, and is mobile.

## 2013-06-15 NOTE — Telephone Encounter (Signed)
Please see prior note, I have are ordered physical and occupational therapy. The patient does not qualify for home health.

## 2013-06-15 NOTE — Telephone Encounter (Signed)
Patient is requesting a refill on Baclofen, which is not currently on med list.  There is a phone note from 11/19 saying we would prescribe a low dose, 10mg  twice daily.  Patient is requesting Rx for 20mg  tabs.  Would you like to prescribe?  Please advise.  Thank you.

## 2013-06-15 NOTE — Telephone Encounter (Signed)
Patient was last seen 04/10/13, per EMR patient was set up for occupational therapy, patient's father is requesting for her to have home health care please advise.

## 2013-06-15 NOTE — Telephone Encounter (Signed)
Patient's father calling stating patient needs referral to Medicare to have Home Health Care--patient has ALS.

## 2013-06-15 NOTE — Telephone Encounter (Signed)
Patient called and requested refill for Baclofen 20 mg.  Requesting rx called into [facility], [address]  Please call patient at earliest convenience.  Thanks

## 2013-06-18 ENCOUNTER — Ambulatory Visit: Payer: Medicare Other | Admitting: Occupational Therapy

## 2013-06-19 ENCOUNTER — Telehealth: Payer: Self-pay | Admitting: Neurology

## 2013-06-19 ENCOUNTER — Telehealth: Payer: Self-pay | Admitting: *Deleted

## 2013-06-19 ENCOUNTER — Ambulatory Visit
Admission: RE | Admit: 2013-06-19 | Discharge: 2013-06-19 | Disposition: A | Discharge status: Home / Self Care | Payer: Medicare Other | Source: Ambulatory Visit | Attending: Neurology | Admitting: Neurology

## 2013-06-19 ENCOUNTER — Other Ambulatory Visit: Payer: Self-pay | Admitting: Neurology

## 2013-06-19 DIAGNOSIS — S62102A Fracture of unspecified carpal bone, left wrist, initial encounter for closed fracture: Secondary | ICD-10-CM

## 2013-06-19 DIAGNOSIS — S6992XA Unspecified injury of left wrist, hand and finger(s), initial encounter: Secondary | ICD-10-CM

## 2013-06-19 MED ORDER — OXYCODONE-ACETAMINOPHEN 5-325 MG PO TABS: 1.0000 | ORAL_TABLET | Freq: Four times a day (QID) | ORAL | Status: DC | PRN

## 2013-06-19 NOTE — Telephone Encounter (Signed)
[facility], she has made an appointment for patient on Monday at 2pm with Dr. Zachery DauerBarnes.  I have faxed information to [phone] and received confirmation.   Patient's brother has been notified.

## 2013-06-19 NOTE — Telephone Encounter (Signed)
A 30 day supply of Oxycontin was written on 04/10 and it also appears Dilaudid was written on 04/12.  I called the patient back.  Spoke with Katherine Potts (listed as emergency contact).  He says they would like to get a Rx for Percocet in addition to the meds she already has due to increased pain.  He states the patient is also having issues with constipation and would like to know if Dr Anne HahnWillis has any suggestions.  He says the patient still has some of the Dilaudid and Oxycontin remaining, but would prefer a Rx for Percocet.  States she takes no less than 2 daily, sometime 3.  Please advise.  Thank you.

## 2013-06-19 NOTE — Telephone Encounter (Signed)
I called patient. The patient indicates that she fell today, injured her left wrist, may be fractured. I'll get an x-ray. The patient is on OxyContin and Dilaudid, but she is not tolerating these medications secondary to nausea and constipation. The patient wants to go back to short-acting Percocet. I'll write this prescription.

## 2013-06-19 NOTE — Telephone Encounter (Signed)
Katherine SackKendra with [facility] called with patient's results.  Left wrist transverse lightly impacted and angulated fracture of distal left radius, and slightly displaced fracture of ulnar styloid.  Doctor has been notified and referral [facility], for appointment on 06-22-13.

## 2013-06-19 NOTE — Telephone Encounter (Signed)
Called Katherine Potts and spoke with Katherine Potts's son Casimiro NeedleMichael informing him that the Katherine Potts's Rx was ready to be picked up at the front desk and if the Katherine Potts has any other problems, questions or concerns to call the office. Katherine Potts's son verbalized understanding.

## 2013-06-22 ENCOUNTER — Telehealth: Payer: Self-pay | Admitting: Neurology

## 2013-06-22 ENCOUNTER — Emergency Department (HOSPITAL_COMMUNITY)
Admission: EM | Admit: 2013-06-22 | Discharge: 2013-06-22 | Discharge status: Against Medical Advice | Payer: Medicare Other

## 2013-06-22 DIAGNOSIS — G1221 Amyotrophic lateral sclerosis: Secondary | ICD-10-CM

## 2013-06-22 DIAGNOSIS — R269 Unspecified abnormalities of gait and mobility: Secondary | ICD-10-CM

## 2013-06-22 NOTE — Telephone Encounter (Signed)
Patient is desperately in need for some kind of care--keeps falling a lot and extremely nauseated--patient has ALS and is declining rapidly--in need of pallative care--please call.

## 2013-06-22 NOTE — Telephone Encounter (Signed)
I called the patient. She has had increasing problems with nausea and vomiting, decreased by mouth intake of food and fluids. The patient is falling frequently. The patient did not go to the orthopedic surgeon today for the fractured wrist on the left. I am not sure why she is having so much problems with the nausea. The patient is on oxycodone which could result in this issue. Medication such as Ativan, baclofen, and tizanidine may cause some gait instability. I suspect that her falls and the nausea are medication related issues. The patient will go to emergency room for an evaluation, she likely is dehydrated this point. I'll get home health physical and occupational therapy set up for her, aid and assistance in the home.

## 2013-06-24 ENCOUNTER — Ambulatory Visit (INDEPENDENT_AMBULATORY_CARE_PROVIDER_SITE_OTHER): Payer: Medicare Other | Admitting: *Deleted

## 2013-06-24 ENCOUNTER — Ambulatory Visit: Payer: Self-pay | Admitting: Neurology

## 2013-06-24 ENCOUNTER — Encounter: Payer: Self-pay | Admitting: Neurology

## 2013-06-24 ENCOUNTER — Ambulatory Visit (INDEPENDENT_AMBULATORY_CARE_PROVIDER_SITE_OTHER): Payer: Medicare Other | Admitting: Neurology

## 2013-06-24 VITALS — BP 116/81 | HR 73

## 2013-06-24 VITALS — BP 101/74 | HR 80 | Wt 141.0 lb

## 2013-06-24 DIAGNOSIS — Z5181 Encounter for therapeutic drug level monitoring: Secondary | ICD-10-CM

## 2013-06-24 DIAGNOSIS — G1221 Amyotrophic lateral sclerosis: Secondary | ICD-10-CM

## 2013-06-24 DIAGNOSIS — R29898 Other symptoms and signs involving the musculoskeletal system: Secondary | ICD-10-CM

## 2013-06-24 DIAGNOSIS — M6281 Muscle weakness (generalized): Secondary | ICD-10-CM

## 2013-06-24 DIAGNOSIS — R110 Nausea: Secondary | ICD-10-CM

## 2013-06-24 DIAGNOSIS — R112 Nausea with vomiting, unspecified: Secondary | ICD-10-CM

## 2013-06-24 LAB — COMPREHENSIVE METABOLIC PANEL
ALK PHOS: 82 IU/L (ref 39–117)
ALT: 21 IU/L (ref 0–32)
AST: 16 IU/L (ref 0–40)
Albumin/Globulin Ratio: 1.7 (ref 1.1–2.5)
Albumin: 4.1 g/dL (ref 3.5–5.5)
BILIRUBIN TOTAL: 0.5 mg/dL (ref 0.0–1.2)
BUN / CREAT RATIO: 11 (ref 9–23)
BUN: 15 mg/dL (ref 6–24)
CO2: 26 mmol/L (ref 18–29)
CREATININE: 1.31 mg/dL — AB (ref 0.57–1.00)
Calcium: 10 mg/dL (ref 8.7–10.2)
Chloride: 103 mmol/L (ref 96–108)
GFR, EST AFRICAN AMERICAN: 54 mL/min/{1.73_m2} — AB (ref >59)
GFR, EST NON AFRICAN AMERICAN: 47 mL/min/{1.73_m2} — AB (ref >59)
GLOBULIN, TOTAL: 2.4 g/dL (ref 1.5–4.5)
Glucose: 91 mg/dL (ref 65–99)
Potassium: 4.1 mmol/L (ref 3.5–5.2)
SODIUM: 141 mmol/L (ref 134–144)
Total Protein: 6.5 g/dL (ref 6.0–8.5)

## 2013-06-24 LAB — CBC WITH DIFFERENTIAL
BASOS: 1 %
Basophils Absolute: 0 10*3/uL (ref 0.0–0.2)
Eos: 1 %
Eosinophils Absolute: 0.1 10*3/uL (ref 0.0–0.4)
HCT: 39.3 % (ref 34.0–46.6)
Hemoglobin: 13.6 g/dL (ref 11.1–15.9)
LYMPHS ABS: 2 10*3/uL (ref 0.7–3.1)
Lymphs: 23 %
MCH: 32.9 pg (ref 26.6–33.0)
MCHC: 34.6 g/dL (ref 31.5–35.7)
MCV: 95 fL (ref 79–97)
MONOS ABS: 0.5 10*3/uL (ref 0.1–0.9)
Monocytes: 6 %
NEUTROS PCT: 69 %
Neutrophils Absolute: 6.1 10*3/uL (ref 1.4–7.0)
Platelets: 249 10*3/uL (ref 150–379)
RBC: 4.13 x10E6/uL (ref 3.77–5.28)
RDW: 13.5 % (ref 12.3–15.4)
WBC: 8.7 10*3/uL (ref 3.4–10.8)

## 2013-06-24 LAB — AMYLASE: AMYLASE: 36 U/L (ref 31–124)

## 2013-06-24 MED ORDER — PROMETHAZINE HCL 25 MG/ML IJ SOLN
25.0000 mg | Freq: Once | INTRAMUSCULAR | Status: AC
  Administered 2013-06-24: 25 mg via INTRAVENOUS

## 2013-06-24 MED ORDER — LORAZEPAM 1 MG PO TABS: 1.0000 mg | ORAL_TABLET | Freq: Three times a day (TID) | ORAL | Status: DC | PRN

## 2013-06-24 MED ORDER — METOCLOPRAMIDE HCL 5 MG PO TABS: 5.0000 mg | ORAL_TABLET | Freq: Three times a day (TID) | ORAL | Status: DC

## 2013-06-24 NOTE — Patient Instructions (Signed)
Pt or son to call back as needed.   Appt made for 09-24-13 at 1230 with Dr. Anne HahnWillis.

## 2013-06-24 NOTE — Patient Instructions (Signed)
Amyotrophic Lateral Sclerosis Amyotrophic Lateral Sclerosis (ALS) is a disease of the nervous system. It is also known as Engineer, agriculturalLou Gehrig's disease. It has no known cause.  SYMPTOMS  ALS shows up (presents) with the loss of nerve cells.  It tends to strike the movement (motor) nerves. These are the nerves that control your muscles.  The nerves used for feeling (sensation) are usually not affected.  With this disease there is continuing (progressive) loss of muscle control and weakness of most muscles. This is most noticeable in the arms and legs.  Later in the disease there is weakness of the facial muscles. Also, there are problems with swallowing and breathing. The voice may also become affected because of problems with weakness. The thinking ability, movements of the eyes, feeling sensations and circular muscles (sphincters) that help control bowels and urination all continue to work. DIAGNOSIS  The diagnosis of ALS is usually made by observing the physical findings (clinical presentation). X-ray and lab studies may be done to support treatment of possible complications. ALS is a disease with no known cause. At this time, there is no known treatment or cure for the disease. It is progressive. So, the muscle weakness will get worse with time. The disease usually ends with respiratory failure since the body is no longer able to breathe. The muscles which keep you breathing are no longer strong enough to do their job. The muscles which control the rib cage become too weak to provide adequate breathing (respiration).  FOR MORE INFORMATION ALS Association: www.alsa.[facility] National Institute of Neurological Disorders and Stroke: http://evans-marshall.biz/www.ninds.nih.gov/disorders Document Released: 02/06/2001 Document Revised: 05/07/2011 Document Reviewed: 02/12/2005 [facility] Patient Information 2014 [employer] [place].

## 2013-06-24 NOTE — Progress Notes (Signed)
Pt here for appt with Dr. Anne HahnWillis.  Order for phenergan 25mg  IVP per Dr. Anne HahnWillis. Pt and son to treatment room, made comfortable in recliner.  Blanket given for warmth.  Son at her side.  Under aseptic technique 23g butterfly inserted to R outer AC , first attempt with good blood return, labs retrieved for CMP, CBC/diff, amylase and then IVF 0.9% NS 100ml attached so to give phenergan 25mg  IVP.  Started IVP 1251 slow IVP, given over 10 minutes.  Pt tolerated well.  Pt stated that she had some good response from the medication.  IV discontinued at 1304.  Pressure applied and bandage applied.    Pt to BR for urinalysis.  Was not enough, took another container to bring in when done.  To car by wheelchair, with son driving.

## 2013-06-24 NOTE — Progress Notes (Signed)
Reason for visit: ALS  Katherine Potts is an 53 y.o. female  History of present illness:  Katherine Potts is a 53 year old right-handed white female with a history of ALS with the [id number] mutation. The patient has had a one-week history of significant nausea and occasional vomiting. The patient has converted to eating mainly liquids, and this has minimized the vomiting. The patient is taking Zofran without benefit. She has come off of a lot of her medications include baclofen, Seroquel, tizanidine, amitriptyline, and her Percocet. She continues to have ongoing anxiety. The patient is still taking her Ativan taking up to 2 mg 3 times daily. The patient is to see her psychiatrist with next 2 weeks, Dr. Evelene CroonKaur. The patient gives a history of peptic ulcer disease in the past. The patient denies any problems with changes in bowel function, but she does have some urinary incontinence at times. The patient has come off of her Paxil. She does have some discomfort in the neck and shoulders that is ongoing. She comes to this office for an evaluation. The patient fell last week and fractured her left wrist. She has been set up to see an orthopedic surgeon, but she missed her appointment on 06/22/2013 as she did not feel well.  Past Medical History  Diagnosis Date  . GERD (gastroesophageal reflux disease)   . Anxiety   . Depression   . MVP (mitral valve prolapse)   . Cervical spondylosis   . 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 Eye surgery    . Breast surgery    . Vagina surgery      mesh  . Kidney donation      Right nephrectomy  . Abdominal hernia repair      Family History  Problem Relation Age of Onset  . Bipolar disorder Brother   . Dementia Mother   . Hypertension Father   . ALS Cousin     Social history:  reports that she has been smoking Cigarettes and Cigars.  She has been smoking about 0.40  packs per day. She has never used smokeless tobacco. She reports that she drinks alcohol. She reports that she uses illicit drugs (Marijuana).    Allergies  Allergen Reactions  . Bactrim [Sulfamethoxazole-Trimethoprim] Nausea Only    Medications:  Current Outpatient Prescriptions on File Prior to Visit  Medication Sig Dispense Refill  . Calcium Carb-Cholecalciferol (CALCIUM 1000 + D PO) Take by mouth.      . calcium carbonate (TUMS - DOSED IN MG ELEMENTAL CALCIUM) 500 MG chewable tablet Chew 1 tablet by mouth daily.      Marland Kitchen. esomeprazole (NEXIUM) 40 MG capsule Take 40 mg by mouth daily before breakfast.      . Flaxseed, Linseed, (FLAX SEED OIL) 1300 MG CAPS Take by mouth.      . folic acid (FOLVITE) 1 MG tablet Take 1 mg by mouth daily.      . Lactulose SOLN 10 mLs by Does not apply route 3 (three) times daily.  500 mL  0  . oxyCODONE-acetaminophen (ROXICET) 5-325 MG per tablet Take 1 tablet by mouth every 6 (six) hours as needed for severe pain.  100 tablet  0  . vitamin C (ASCORBIC ACID) 500 MG tablet Take 500 mg by mouth daily.       No current facility-administered medications on file prior to visit.    ROS:  Out  of a complete 14 system review of symptoms, the patient complains only of the following symptoms, and all other reviewed systems are negative.  Appetite change, fatigue  Cough, wheezing, shortness of breath Cold intolerance, heat intolerance, excessive thirst Constipation, nausea Back pain, achy muscles, muscle cramps, walking difficulties, neck pain, stiffness Bruising easily Memory loss, dizziness, headache, speech difficulties, weakness Confusion, decreased concentration, depression, anxiety  Blood pressure 101/74, pulse 80, weight 141 lb (63.957 kg).  Physical Exam  General: The patient is alert and cooperative at the time of the examination.  Skin: No significant peripheral edema is noted.   Neurologic Exam  Mental status: The patient is oriented x  3.  Cranial nerves: Facial symmetry is present. Speech is normal, no aphasia or dysarthria is noted. Extraocular movements are full. Visual fields are full.  Motor: The patient has good strength in all 4 extremities, with exception that there is weakness of the intrinsic muscles of the hands bilaterally, left greater right.  Sensory examination: Soft a sensation is symmetric on the face, arms, legs.  Coordination: The patient has good finger-nose-finger and heel-to-shin bilaterally.  Gait and station: The patient has a slightly wide-based, unsteady gait. Tandem gait was not tested. Romberg is negative. No drift is seen.  Reflexes: Deep tendon reflexes are symmetric, but are briskly well. The patient has 6 beats of ankle clonus on the left ankle, not present on the right.   Assessment/Plan:  1. ALS  2. Intractable nausea  The patient will be set up for further blood work today. She will undergo a urinalysis. She will be placed on Reglan taking 5 mg 3 times daily on a scheduled basis. The patient may be having some anxiety in part as an etiology of her nausea. The patient however, indicates that she has a history of peptic ulcer disease. If the nausea does not improve, a referral to a gastroenterologist may be indicated. She will otherwise followup in 3 months. Orders have been placed earlier for home health therapy and aid and assistance.  Marlan Palau. Keith Meygan Kyser MD 06/24/2013 1:59 PM  [facility] [address] [place], [place] [id number]  Phone [phone] Fax [phone]

## 2013-06-25 ENCOUNTER — Telehealth: Payer: Self-pay | Admitting: Neurology

## 2013-06-25 LAB — URINALYSIS, ROUTINE W REFLEX MICROSCOPIC
Bilirubin, UA: NEGATIVE
GLUCOSE, UA: NEGATIVE
KETONES UA: NEGATIVE
Nitrite, UA: POSITIVE — AB
Protein, UA: NEGATIVE
RBC, UA: NEGATIVE
SPEC GRAV UA: 1.025 (ref 1.005–1.030)
Urobilinogen, Ur: 0.2 mg/dL (ref 0.0–1.9)
pH, UA: 6 (ref 5.0–7.5)

## 2013-06-25 LAB — MICROSCOPIC EXAMINATION
RBC, UA: NONE SEEN /hpf (ref 0–?)
WBC, UA: >30 /hpf — AB (ref 0–?)

## 2013-06-25 MED ORDER — CIPROFLOXACIN HCL 250 MG PO TABS: 250.0000 mg | ORAL_TABLET | Freq: Two times a day (BID) | ORAL | Status: DC

## 2013-06-25 NOTE — Telephone Encounter (Signed)
I called patient. The blood work shows chronic renal insufficiency that is stable. The patient has a bladder infection on the urinalysis. This may be what is causing her to feel poorly, nauseated. I will call in a prescription for Cipro.

## 2013-06-29 ENCOUNTER — Encounter (HOSPITAL_COMMUNITY): Payer: Self-pay | Admitting: Emergency Medicine

## 2013-06-29 ENCOUNTER — Emergency Department (HOSPITAL_COMMUNITY)
Admission: EM | Admit: 2013-06-29 | Discharge: 2013-06-29 | Disposition: A | Discharge status: Home / Self Care | Payer: Medicare Other | Attending: Emergency Medicine | Admitting: Emergency Medicine

## 2013-06-29 ENCOUNTER — Emergency Department (HOSPITAL_COMMUNITY): Payer: Medicare Other

## 2013-06-29 DIAGNOSIS — Z792 Long term (current) use of antibiotics: Secondary | ICD-10-CM | POA: Insufficient documentation

## 2013-06-29 DIAGNOSIS — M62838 Other muscle spasm: Secondary | ICD-10-CM | POA: Insufficient documentation

## 2013-06-29 DIAGNOSIS — G8929 Other chronic pain: Secondary | ICD-10-CM

## 2013-06-29 DIAGNOSIS — M47812 Spondylosis without myelopathy or radiculopathy, cervical region: Secondary | ICD-10-CM | POA: Insufficient documentation

## 2013-06-29 DIAGNOSIS — K219 Gastro-esophageal reflux disease without esophagitis: Secondary | ICD-10-CM | POA: Insufficient documentation

## 2013-06-29 DIAGNOSIS — F411 Generalized anxiety disorder: Secondary | ICD-10-CM | POA: Insufficient documentation

## 2013-06-29 DIAGNOSIS — R110 Nausea: Secondary | ICD-10-CM | POA: Insufficient documentation

## 2013-06-29 DIAGNOSIS — F3289 Other specified depressive episodes: Secondary | ICD-10-CM | POA: Insufficient documentation

## 2013-06-29 DIAGNOSIS — Z8669 Personal history of other diseases of the nervous system and sense organs: Secondary | ICD-10-CM | POA: Insufficient documentation

## 2013-06-29 DIAGNOSIS — Z87828 Personal history of other (healed) physical injury and trauma: Secondary | ICD-10-CM | POA: Insufficient documentation

## 2013-06-29 DIAGNOSIS — Z79899 Other long term (current) drug therapy: Secondary | ICD-10-CM | POA: Insufficient documentation

## 2013-06-29 DIAGNOSIS — Z9889 Other specified postprocedural states: Secondary | ICD-10-CM | POA: Insufficient documentation

## 2013-06-29 DIAGNOSIS — M546 Pain in thoracic spine: Secondary | ICD-10-CM | POA: Insufficient documentation

## 2013-06-29 DIAGNOSIS — F329 Major depressive disorder, single episode, unspecified: Secondary | ICD-10-CM | POA: Insufficient documentation

## 2013-06-29 DIAGNOSIS — M542 Cervicalgia: Secondary | ICD-10-CM | POA: Insufficient documentation

## 2013-06-29 DIAGNOSIS — M549 Dorsalgia, unspecified: Secondary | ICD-10-CM

## 2013-06-29 DIAGNOSIS — R6883 Chills (without fever): Secondary | ICD-10-CM | POA: Insufficient documentation

## 2013-06-29 DIAGNOSIS — Z87891 Personal history of nicotine dependence: Secondary | ICD-10-CM | POA: Insufficient documentation

## 2013-06-29 LAB — URINALYSIS, ROUTINE W REFLEX MICROSCOPIC
Bilirubin Urine: NEGATIVE
Glucose, UA: NEGATIVE mg/dL
Hgb urine dipstick: NEGATIVE
KETONES UR: NEGATIVE mg/dL
LEUKOCYTES UA: NEGATIVE
NITRITE: NEGATIVE
PROTEIN: NEGATIVE mg/dL
Specific Gravity, Urine: 1.008 (ref 1.005–1.030)
UROBILINOGEN UA: 0.2 mg/dL (ref 0.0–1.0)
pH: 6 (ref 5.0–8.0)

## 2013-06-29 LAB — CBC
HCT: 36.9 % (ref 36.0–46.0)
Hemoglobin: 12.5 g/dL (ref 12.0–15.0)
MCH: 32.6 pg (ref 26.0–34.0)
MCHC: 33.9 g/dL (ref 30.0–36.0)
MCV: 96.1 fL (ref 78.0–100.0)
PLATELETS: 246 10*3/uL (ref 150–400)
RBC: 3.84 MIL/uL — AB (ref 3.87–5.11)
RDW: 13.1 % (ref 11.5–15.5)
WBC: 8.2 10*3/uL (ref 4.0–10.5)

## 2013-06-29 LAB — I-STAT CHEM 8, ED
BUN: 11 mg/dL (ref 6–23)
CALCIUM ION: 1.25 mmol/L — AB (ref 1.12–1.23)
Chloride: 101 mEq/L (ref 96–112)
Creatinine, Ser: 1.3 mg/dL — ABNORMAL HIGH (ref 0.50–1.10)
Glucose, Bld: 96 mg/dL (ref 70–99)
HEMATOCRIT: 38 % (ref 36.0–46.0)
HEMOGLOBIN: 12.9 g/dL (ref 12.0–15.0)
Potassium: 3.8 mEq/L (ref 3.7–5.3)
SODIUM: 138 meq/L (ref 137–147)
TCO2: 23 mmol/L (ref 0–100)

## 2013-06-29 MED ORDER — FENTANYL CITRATE 0.05 MG/ML IJ SOLN
100.0000 ug | Freq: Once | INTRAMUSCULAR | Status: AC
  Administered 2013-06-29: 100 ug via INTRAVENOUS

## 2013-06-29 MED ORDER — DIAZEPAM 5 MG/ML IJ SOLN: 2.5000 mg | Freq: Once | INTRAMUSCULAR | Status: DC

## 2013-06-29 MED ORDER — PROMETHAZINE HCL 25 MG PO TABS: 25.0000 mg | ORAL_TABLET | Freq: Four times a day (QID) | ORAL | Status: DC | PRN

## 2013-06-29 MED ORDER — DIAZEPAM 5 MG PO TABS
5.0000 mg | ORAL_TABLET | Freq: Once | ORAL | Status: AC
  Administered 2013-06-29: 5 mg via ORAL
  Filled 2013-06-29: qty 1

## 2013-06-29 MED ORDER — OXYCODONE-ACETAMINOPHEN 5-325 MG PO TABS
1.0000 | ORAL_TABLET | Freq: Once | ORAL | Status: AC
  Administered 2013-06-29: 1 via ORAL
  Filled 2013-06-29: qty 1

## 2013-06-29 MED ORDER — PROMETHAZINE HCL 25 MG PO TABS
25.0000 mg | ORAL_TABLET | ORAL | Status: AC
  Administered 2013-06-29: 25 mg via ORAL
  Filled 2013-06-29 (×2): qty 1

## 2013-06-29 MED ORDER — DIAZEPAM 5 MG PO TABS: 5.0000 mg | ORAL_TABLET | Freq: Two times a day (BID) | ORAL | Status: DC | PRN

## 2013-06-29 MED ORDER — OXYCODONE-ACETAMINOPHEN 5-325 MG PO TABS
2.0000 | ORAL_TABLET | Freq: Once | ORAL | Status: DC
  Filled 2013-06-29: qty 2

## 2013-06-29 MED ORDER — DIAZEPAM 5 MG/ML IJ SOLN
2.5000 mg | Freq: Once | INTRAMUSCULAR | Status: AC
Start: 2013-06-29 — End: 2013-06-29
  Administered 2013-06-29: 2.5 mg via INTRAVENOUS
  Filled 2013-06-29: qty 2

## 2013-06-29 MED ORDER — FENTANYL CITRATE 0.05 MG/ML IJ SOLN
50.0000 ug | Freq: Once | INTRAMUSCULAR | Status: DC
  Filled 2013-06-29: qty 2

## 2013-06-29 NOTE — ED Provider Notes (Signed)
Pt received in sign out from PA Humes at shift change.  53 yo. F with ALS with multiple chronic complaints-- nausea, back pain, etc.  Pts pain and nausea has been treated in the ED, pt requests thoracic x-ray prior to discharge.  Plan:  X-ray pending at this time.  If negative, will discharge home with Rx phenergan.  Results for orders placed during the hospital encounter of 06/29/13  CBC      Result Value Ref Range   WBC 8.2  4.0 - 10.5 K/uL   RBC 3.84 (*) 3.87 - 5.11 MIL/uL   Hemoglobin 12.5  12.0 - 15.0 g/dL   HCT 16.136.9  09.636.0 - 04.546.0 %   MCV 96.1  78.0 - 100.0 fL   MCH 32.6  26.0 - 34.0 pg   MCHC 33.9  30.0 - 36.0 g/dL   RDW 40.913.1  81.111.5 - 91.415.5 %   Platelets 246  150 - 400 K/uL  URINALYSIS, ROUTINE W REFLEX MICROSCOPIC      Result Value Ref Range   Color, Urine YELLOW  YELLOW   APPearance CLEAR  CLEAR   Specific Gravity, Urine 1.008  1.005 - 1.030   pH 6.0  5.0 - 8.0   Glucose, UA NEGATIVE  NEGATIVE mg/dL   Hgb urine dipstick NEGATIVE  NEGATIVE   Bilirubin Urine NEGATIVE  NEGATIVE   Ketones, ur NEGATIVE  NEGATIVE mg/dL   Protein, ur NEGATIVE  NEGATIVE mg/dL   Urobilinogen, UA 0.2  0.0 - 1.0 mg/dL   Nitrite NEGATIVE  NEGATIVE   Leukocytes, UA NEGATIVE  NEGATIVE  I-STAT CHEM 8, ED      Result Value Ref Range   Sodium 138  137 - 147 mEq/L   Potassium 3.8  3.7 - 5.3 mEq/L   Chloride 101  96 - 112 mEq/L   BUN 11  6 - 23 mg/dL   Creatinine, Ser 7.821.30 (*) 0.50 - 1.10 mg/dL   Glucose, Bld 96  70 - 99 mg/dL   Calcium, Ion 9.561.25 (*) 1.12 - 1.23 mmol/L   TCO2 23  0 - 100 mmol/L   Hemoglobin 12.9  12.0 - 15.0 g/dL   HCT 21.338.0  08.636.0 - 57.846.0 %   Dg Thoracic Spine 2 View  06/29/2013   CLINICAL DATA:  Trauma, fall.  Upper back pain.  EXAM: THORACIC SPINE - 2 VIEW  COMPARISON:  None.  FINDINGS: Vertebral bodies are normally aligned with preservation of the normal thoracic kyphosis. Vertebral body heights are preserved. No acute fracture or listhesis.  Moderate degenerative endplate spurring seen  anteriorly within the mid and lower thoracic spine. No paraspinous soft tissue abnormality.  Subcentimeter calcific density overlies the left upper quadrant, of uncertain clinical significance.  IMPRESSION: No radiographic evidence of acute traumatic injury within the thoracic spine.   Electronically Signed   By: Rise MuBenjamin  McClintock M.D.   On: 06/29/2013 16:53   Dg Wrist Complete Left  06/19/2013   CLINICAL DATA:  Recent fall, wrist pain  EXAM: LEFT WRIST - COMPLETE 3+ VIEW  COMPARISON:  None.  FINDINGS: There is a slightly impacted and angulated fracture of the distal left radius. Also, there is a slightly displaced fracture of the ulnar styloid. The carpal bones are in normal position.  IMPRESSION: 1. Transverse slightly impacted and angulated fracture of the distal left radius. 2. Slightly displaced fracture of the ulnar styloid.   Electronically Signed   By: Dwyane DeePaul  Barry M.D.   On: 06/19/2013 15:53    Thoracic spine  x-ray negative for acute fx or subluxation.  Pt states nausea well controlled with phenergan, spasms controlled with valium-- will be discharged with the same.  Close FU with PCP recommended.  Discussed plan with patient, he/she acknowledged understanding and agreed with plan of care.  Return precautions given for new or worsening symptoms.  Garlon HatchetLisa M Estefany Goebel, PA-C 06/29/13 1958

## 2013-06-29 NOTE — Discharge Instructions (Signed)
Nausea, Adult Nausea is the feeling that you have an upset stomach or have to vomit. Nausea by itself is not likely a serious concern, but it may be an early sign of more serious medical problems. As nausea gets worse, it can lead to vomiting. If vomiting develops, there is the risk of dehydration.  CAUSES   Viral infections.  Food poisoning.  Medicines.  Pregnancy.  Motion sickness.  Migraine headaches.  Emotional distress.  Severe pain from any source.  Alcohol intoxication. HOME CARE INSTRUCTIONS  Get plenty of rest.  Ask your caregiver about specific rehydration instructions.  Eat small amounts of food and sip liquids more often.  Take all medicines as told by your caregiver. SEEK MEDICAL CARE IF:  You have not improved after 2 days, or you get worse.  You have a headache. SEEK IMMEDIATE MEDICAL CARE IF:   You have a fever.  You faint.  You keep vomiting or have blood in your vomit.  You are extremely weak or dehydrated.  You have dark or bloody stools.  You have severe chest or abdominal pain. MAKE SURE YOU:  Understand these instructions.  Will watch your condition.  Will get help right away if you are not doing well or get worse. Document Released: 03/22/2004 Document Revised: 11/07/2011 Document Reviewed: 10/25/2010 [facility] Patient Information 2014 [place], [place]. Back Pain, Adult Back pain is very common. The pain often gets better over time. The cause of back pain is usually not dangerous. Most people can learn to manage their back pain on their own.  HOME CARE   Stay active. Start with short walks on flat ground if you can. Try to walk farther each day.  Do not sit, drive, or stand in one place for more than 30 minutes. Do not stay in bed.  Do not avoid exercise or work. Activity can help your back heal faster.  Be careful when you bend or lift an object. Bend at your knees, keep the object close to you, and do not twist.  Sleep on a  firm mattress. Lie on your side, and bend your knees. If you lie on your back, put a pillow under your knees.  Only take medicines as told by your doctor.  Put ice on the injured area.  Put ice in a plastic bag.  Place a towel between your skin and the bag.  Leave the ice on for 15-20 minutes, 03-04 times a day for the first 2 to 3 days. After that, you can switch between ice and heat packs.  Ask your doctor about back exercises or massage.  Avoid feeling anxious or stressed. Find good ways to deal with stress, such as exercise. GET HELP RIGHT AWAY IF:   Your pain does not go away with rest or medicine.  Your pain does not go away in 1 week.  You have new problems.  You do not feel well.  The pain spreads into your legs.  You cannot control when you poop (bowel movement) or pee (urinate).  Your arms or legs feel weak or lose feeling (numbness).  You feel sick to your stomach (nauseous) or throw up (vomit).  You have belly (abdominal) pain.  You feel like you may pass out (faint). MAKE SURE YOU:   Understand these instructions.  Will watch your condition.  Will get help right away if you are not doing well or get worse. Document Released: 08/01/2007 Document Revised: 05/07/2011 Document Reviewed: 07/03/2010 [facility] Patient Information 2014 [place], [place].

## 2013-06-29 NOTE — ED Notes (Signed)
Pt wax and wayne from calm to anxious and tearful. Pt denies any pain relief. Family at bedside. Md informed of pain scale.

## 2013-06-29 NOTE — ED Notes (Signed)
Pt states n/v for 2 days. Currently being treated for UTI. Pt states she thinks the Cipro is causing the nausea. IV 500 bolus and 4 zofran pta. Pt states no relief from zofran only phenergan works.

## 2013-06-29 NOTE — ED Notes (Signed)
Pt alert x4 respirations easy non labored.  

## 2013-06-29 NOTE — ED Provider Notes (Signed)
CSN: [id number]     Arrival date & time 06/29/13  1201 History   First MD Initiated Contact with Patient 06/29/13 1239     Chief Complaint  Patient presents with  . Nausea    (Consider location/radiation/quality/duration/timing/severity/associated sxs/prior Treatment) HPI Comments: Patient is a 53 y/o female with a history of reflux, "pinched nerve" in her cervical spine, and ALS. She presents today for a variety of chronic complaints. Patient states she has been experiencing nausea associated with chills which has not been relieved by zofran or reglan. She states she saw her neurologist, Dr. Anne HahnWillis, for this. He began treating her with Cipro 3 days ago when her UA in office suggested infection. Patient took cipro for 2 days, but states it worsened her nausea so she stopped taking it. Patient states she has "not been able to eat solid foods for week". She has been eating tapioca and pudding and drinking ginger ale and water because everything else makes her "dry heave".  Patient also c/o upper back pain from a "pinched nerve". Patient states she has had this for some time. She denies any new or worsening numbness or weakness in her arms. She states she has been "falling all the time", but denies LOC. No bowel/bladder incontinence or inability to ambulate. Patient is followed by a neurosurgeon.  PCP - Dr. Ihor DowNnodi.  The history is provided by the patient. No language interpreter was used.    Past Medical History  Diagnosis Date  . GERD (gastroesophageal reflux disease)   . Anxiety   . Depression   . MVP (mitral valve prolapse)   . Cervical spondylosis   . 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 Eye surgery    . Breast surgery    . Vagina surgery      mesh  . Kidney donation      Right nephrectomy  . Abdominal hernia repair     Family History  Problem Relation Age of Onset  . Bipolar disorder Brother     . Dementia Mother   . Hypertension Father   . ALS Cousin    History  Substance Use Topics  . Smoking status: Former Smoker -- 0.40 packs/day    Types: Cigarettes, Cigars  . Smokeless tobacco: Never Used  . Alcohol Use: No     Comment: sociallly   OB History   Grav Para Term Preterm Abortions TAB SAB Ect Mult Living                  Review of Systems  Constitutional: Positive for chills. Negative for fever.  Gastrointestinal: Positive for nausea. Negative for vomiting ("dry heaves" only), abdominal pain, diarrhea and blood in stool.  Genitourinary: Negative for dysuria and flank pain.  Musculoskeletal: Positive for back pain and neck pain (chronic).  Neurological: Negative for syncope, weakness and numbness.  All other systems reviewed and are negative.    Allergies  Bactrim and Ciprofloxacin  Home Medications   Prior to Admission medications   Medication Sig Start Date End Date Taking? Authorizing Provider  calcium carbonate (TUMS - DOSED IN MG ELEMENTAL CALCIUM) 500 MG chewable tablet Chew 1 tablet by mouth daily.   Yes Historical Provider, MD  ciprofloxacin (CIPRO) 250 MG tablet Take 1 tablet (250 mg total) by mouth 2 (two) times daily. 06/25/13  Yes [name], MD  esomeprazole (NEXIUM) 40 MG capsule Take 40 mg by mouth daily  before breakfast.   Yes Historical Provider, MD  LORazepam (ATIVAN) 1 MG tablet Take 2 mg by mouth 3 (three) times daily as needed for anxiety.   Yes Historical Provider, MD  metoCLOPramide (REGLAN) 5 MG tablet Take 1 tablet (5 mg total) by mouth 3 (three) times daily before meals. 06/24/13  Yes York Spaniel, MD  ondansetron (ZOFRAN) 4 MG tablet Take 4 mg by mouth every 6 (six) hours as needed for nausea.  06/07/13  Yes Brandt Loosen, MD  oxyCODONE-acetaminophen (ROXICET) 5-325 MG per tablet Take 1 tablet by mouth every 6 (six) hours as needed for severe pain. 06/19/13  Yes York Spaniel, MD   BP 136/80  Pulse 83  Temp(Src) 98.4 F (36.9  C)  Resp 18  Ht 5\' 4"  (1.626 m)  Wt 145 lb (65.772 kg)  BMI 24.88 kg/m2  SpO2 99%   Physical Exam  Nursing note and vitals reviewed. Constitutional: She is oriented to person, place, and time. She appears well-developed and well-nourished. No distress.  Nontoxic/nonseptic appearing.  HENT:  Head: Normocephalic and atraumatic.  Mouth/Throat: Oropharynx is clear and moist. No oropharyngeal exudate.  Eyes: Conjunctivae and EOM are normal. No scleral icterus.  Neck: Normal range of motion. Neck supple.  Cardiovascular: Normal rate, regular rhythm and normal heart sounds.   Pulmonary/Chest: Effort normal and breath sounds normal. No respiratory distress. She has no wheezes. She has no rales.  Abdominal: Soft. She exhibits no distension. There is no tenderness.  Musculoskeletal: Normal range of motion. She exhibits tenderness.       Cervical back: She exhibits normal range of motion, no edema and no deformity.       Thoracic back: She exhibits tenderness. She exhibits normal range of motion and no bony tenderness.       Back:  Mild TTP to L thoracic paraspinal muscles with mild spasm. No bony deformities or step offs palpated to cervical, thoracic, or lumbosacral midline. No evidence of acute trauma.  Neurological: She is alert and oriented to person, place, and time. She has normal reflexes. No cranial nerve deficit. Coordination normal.  GCS 15. Speech goal oriented. Patient ambulates with normal gait and without assistance. She moves her extremities without ataxia.  Skin: Skin is warm and dry. No rash noted. She is not diaphoretic. No erythema. No pallor.  Psychiatric: She has a normal mood and affect. Her behavior is normal.    ED Course  Procedures (including critical care time) Labs Review Labs Reviewed  CBC - Abnormal; Notable for the following:    RBC 3.84 (*)    All other components within normal limits  I-STAT CHEM 8, ED - Abnormal; Notable for the following:     Creatinine, Ser 1.30 (*)    Calcium, Ion 1.25 (*)    All other components within normal limits  URINALYSIS, ROUTINE W REFLEX MICROSCOPIC   Imaging Review No results found.   EKG Interpretation None      MDM   Final diagnoses:  Chronic nausea  Chronic back pain    53 year old female with a history of ALS, cervical spondylosis, and reflux presents for a multitude of chronic complaints. Patient primarily complaining of nausea which has been persistent for the last 2 days and unrelieved by antiemetics. Patient has been able to tolerate many glasses of PO fluids over ED stay. She has been treated with Phenergan with very little improvement. Patient has no abdominal TTP. No leukocytosis. Labs c/w baseline. UA does not suggest infection today.  Patient also with c/o chronic back pain. Have treated with fentanyl, valium, and percocet without relief. Patient requesting xray which will obtain. Additional valium ordered IM for spasm. Imaging pending. Patient signed out to Sharilyn SitesLisa Sanders, PA-C at shift change who will f/u on imaging and dispo appropriately. Anticipate d/c home with Rx for phenergan.   Filed Vitals:   06/29/13 1210 06/29/13 1219 06/29/13 1440  BP:  123/83 136/80  Pulse:   83  Temp:  98.4 F (36.9 C)   Resp:  18   Height:  5\' 4"  (1.626 m)   Weight:  145 lb (65.772 kg)   SpO2: 98% 98% 99%       Antony MaduraKelly Navy Belay, PA-C 06/29/13 1643

## 2013-06-30 ENCOUNTER — Ambulatory Visit: Payer: Medicare Other | Attending: Neurology | Admitting: Occupational Therapy

## 2013-06-30 NOTE — ED Provider Notes (Signed)
Shared service with midlevel provider. I have personally seen and examined the patient, providing direct face to face care, presenting with the chief complaint of back pain, neck pain, nausea. Physical exam findings include normal vitals, no red flags on neuro exam to be concerned of any spinal cord compression and labs showing no abnormality. Plan will be to dc and have her see her pcp, neuro and pain doctor for further eval. I have reviewed the nursing documentation on past medical history, family history, and social history.   Derwood KaplanAnkit Walden Statz, MD 06/30/13 2128

## 2013-07-02 ENCOUNTER — Telehealth: Payer: Self-pay | Admitting: *Deleted

## 2013-07-02 NOTE — Telephone Encounter (Signed)
Been falling a lot and she needs assistance. Says she left a note for you on Tuesday afternoon. Wants a call back asap.

## 2013-07-02 NOTE — Telephone Encounter (Signed)
The patient continues to fall, she is a fall risk, and will need physical therapy in the home environment, aid and assistance. I order this on 06/22/2013. The patient has not heard anything about this yet, I will check into this process.

## 2013-07-02 NOTE — Telephone Encounter (Signed)
Betsy @ [facility] has taken care of Ms. Munyan's referral. Lorain ChildesFYI

## 2013-07-02 NOTE — Telephone Encounter (Signed)
[name], Katherine A - 07/02/2013 12:04 PM ','<More Detail >>       [name], MD       Sent: Thu Jul 02, 2013 12:53 PM    To: [name]                   Message     ----- Message from [name], MD sent at 07/02/2013 12:53 PM -----     Home health therapy was ordered on 06/22/2013. The patient has not heard anything yet, I am not sure where the process is at this point. If I need to put in another order, please let me know.            ----- Message -----    From: [name]    Sent: 07/02/2013 12:06 PM    To: [name]                            Select Font Size     Small Medium Large Extra Extra Large                [name] Description: 53 year old female  07/02/2013 Telephone Provider: Seth BakeDana Jones  MRN: [id number] Department: [facility]             Reason for Call     Advice Only     Been falling a lot and she needs assistance. Says she left a note for you on Tuesday afternoon. Wants a call back asap.                  Call Documentation     [name], MD at 07/02/2013 12:51 PM     Status: Signed        The patient continues to fall, she is a fall risk, and will need physical therapy in the home environment, aid and assistance. I order this on 06/22/2013. The patient has not heard anything about this yet, I will check into this process.        [name], [name] at 07/02/2013 12:28 PM     Status: Signed        Been falling a lot and she needs assistance. Says she left a note for you on Tuesday afternoon. Wants a call back asap.                          Encounter MyChart Messages     No messages in this encounter             Routing History     Priority Sent On From To Message Type     07/02/2013 12:53 PM [name], MD [facility] Triage Pool Patient Calls     Comment: Home health therapy was ordered on 06/22/2013. The patient has not heard anything yet, I  am not sure where the process is at this point. If I need to put in another order, please let me know.     07/02/2013 12:28 PM [name], [name] [name], MD Patient Calls     07/02/2013 12:06 PM [name] [facility] Triage Pool Patient Calls           Created by     [name] on 07/02/2013 12:04 PM  Visit Pharmacy     [facility] [place]- [id number]             Contacts       Type Contact Phone    07/02/2013 12:04 PM Phone (Incoming) [name], Katherine A (Self) [phone] (H)    07/02/2013 12:48 PM Phone (Outgoing) [name], Katherine A (Self) [phone] (H)

## 2013-07-03 ENCOUNTER — Telehealth: Payer: Self-pay | Admitting: *Deleted

## 2013-07-03 ENCOUNTER — Other Ambulatory Visit: Payer: Self-pay | Admitting: *Deleted

## 2013-07-03 DIAGNOSIS — G1221 Amyotrophic lateral sclerosis: Secondary | ICD-10-CM

## 2013-07-03 NOTE — Telephone Encounter (Signed)
Referral has been taken care of

## 2013-07-03 NOTE — Progress Notes (Signed)
[facility] Faxed to [phone]

## 2013-07-03 NOTE — Telephone Encounter (Signed)
Patient's brother Maurine Minister(Dennis) requesting an order for manual wheelchair. Please return his call and advise.

## 2013-07-03 NOTE — Telephone Encounter (Signed)
Per Dr. Anne HahnWillis ok to order.

## 2013-07-05 DIAGNOSIS — G1221 Amyotrophic lateral sclerosis: Secondary | ICD-10-CM

## 2013-07-06 ENCOUNTER — Telehealth: Payer: Self-pay | Admitting: Neurology

## 2013-07-06 ENCOUNTER — Telehealth: Payer: Self-pay | Admitting: *Deleted

## 2013-07-06 NOTE — Telephone Encounter (Signed)
Spoke with Katherine Potts and he is needing insurance information, phone # to contact patient, LOV notes,for patient Faxed information, received confirmation of the fax 07/06/13

## 2013-07-06 NOTE — Telephone Encounter (Signed)
[facility] asking for verbal order for medical social work to see pt.  Also needing note stating why pt needs a wheelchair to move around the home to complete ADL's ([facility]

## 2013-07-06 NOTE — Telephone Encounter (Signed)
I called Marylu LundJanet. Okay for social worker to see the patient. In terms of the wheelchair, the patient may be able to utilize just a transport chair, she will not be a will to mobilize the chair on her own.

## 2013-07-06 NOTE — Telephone Encounter (Signed)
Barbara CowerJason w/ [facility] has questions about order faxed there regarding patient--please call.

## 2013-07-08 ENCOUNTER — Other Ambulatory Visit (HOSPITAL_COMMUNITY): Payer: Self-pay | Admitting: *Deleted

## 2013-07-08 ENCOUNTER — Encounter (HOSPITAL_COMMUNITY): Payer: Self-pay | Admitting: *Deleted

## 2013-07-08 MED ORDER — CEFAZOLIN SODIUM-DEXTROSE 2-3 GM-% IV SOLR
2.0000 g | INTRAVENOUS | Status: AC
  Administered 2013-07-09: 2 g via INTRAVENOUS
  Filled 2013-07-08: qty 50

## 2013-07-08 NOTE — H&P (Signed)
Katherine Potts is an 53 y.o. female.   Chief Complaint: LEFT DISTAL RADIUS FRACTURE HPI: PT WITH CLOSED LEFT DISTAL RADIUS FRACTURE AFTER A FALL SEVERAL WEEKS AGO PT PRESENTED TO OFFICE WITH DISPLACED AND ANGULATED DISTAL RADIUS FRACTURE PT HERE FOR SURGERY FOR LEFT WRIST DEFORMITY NO PRIOR SURGERY TO LEFT WRIST  Past Medical History  Diagnosis Date  . GERD (gastroesophageal reflux disease)   . Anxiety   . Depression   . MVP (mitral valve prolapse)   . Cervical spondylosis   . 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 Pneumonia   . Constipation     Past Surgical History  Procedure Laterality Date  . Eye surgery    . Breast surgery    . Vagina surgery      mesh  . Kidney donation      Right nephrectomy  . Abdominal hernia repair      Family History  Problem Relation Age of Onset  . Bipolar disorder Brother   . Dementia Mother   . Hypertension Father   . ALS Cousin    Social History:  reports that she has quit smoking. Her smoking use included Cigarettes and Cigars. She smoked 0.40 packs per day. She has never used smokeless tobacco. She reports that she uses illicit drugs (Marijuana). She reports that she does not drink alcohol.  Allergies:  Allergies  Allergen Reactions  . Bactrim [Sulfamethoxazole-Trimethoprim] Nausea Only  . Ciprofloxacin Nausea And Vomiting    No prescriptions prior to admission    No results found for this or any previous visit (from the past 48 hour(s)). No results found.  ROS NO RECENT ILLNESSES OR HOSPITALIZATIONS  There were no vitals taken for this visit. Physical Exam  General Appearance:  Alert, cooperative, no distress, appears stated age  Head:  Normocephalic, without obvious abnormality, atraumatic  Eyes:  Pupils equal, conjunctiva/corneas clear,         Throat: Lips, mucosa, and tongue normal; teeth and gums normal  Neck: No visible masses     Lungs:   respirations unlabored  Chest Wall:   No tenderness or deformity  Heart:  Regular rate and rhythm,  Abdomen:   Soft, non-tender,         Extremities: LEFT WRIST: SKIN INTACT FINGERS WARM WELL PERFUSED,ABLE TO EXTEND THUMB AND GENTLY FLEX DIGITS  Pulses: 2+ and symmetric  Skin: Skin color, texture, turgor normal, no rashes or lesions     Neurologic: Normal    Assessment/Plan CLOSED COMMINUTED DISPLACED IA DISTAL RADIUS FRACTURE, LEFT, NASCENT MALUNION  LEFT DISTAL RADIUS OPEN REDUCTION AND INTERNAL FIXATION AND REPAIR/BONE GRAFTING AS INDICATED  R/B/A DISCUSSED WITH PT IN OFFICE.  PT VOICED UNDERSTANDING OF PLAN CONSENT SIGNED DAY OF SURGERY PT SEEN AND EXAMINED PRIOR TO OPERATIVE PROCEDURE/DAY OF SURGERY SITE MARKED. QUESTIONS ANSWERED WILL REMAIN OVERNIGHT FOLLOWING SURGERY  Thomasene RippleFred W [facility] 07/09/2013  1411

## 2013-07-08 NOTE — Progress Notes (Signed)
Spoke with pt's son, Katherine Potts for pre-op phone call. Pt was listening via speaker phone, but due to her ALS, she has difficulty talking.

## 2013-07-08 NOTE — Brief Op Note (Signed)
07/09/2013  4:10 PM  PATIENT:  Katherine Potts  [age] y.o. female  PRE-OPERATIVE DIAGNOSIS:  LEFT DISTAL RADIUS FRACTURE   POST-OPERATIVE DIAGNOSIS:  SAME  PROCEDURE:  Procedure(s): OPEN REDUCTION INTERNAL FIXATION (ORIF) LEFT  WRIST FRACTURE (Left)  SURGEON:  Surgeon(s) and Role:    * Sharma CovertFred W Shirline Kendle, MD - Primary  PHYSICIAN ASSISTANT:   ASSISTANTS: none   ANESTHESIA:   general  EBL:     BLOOD ADMINISTERED:none  DRAINS: none and Penrose drain in the 1   LOCAL MEDICATIONS USED:     SPECIMEN:  No Specimen  DISPOSITION OF SPECIMEN:  N/A  COUNTS:  YES  TOURNIQUET:  * No tourniquets in log *  DICTATIO: [id number]: [id number]  PLAN OF CARE: Discharge to home after PACU  PATIENT DISPOSITION:  PACU - hemodynamically stable.   Delay start of Pharmacological VTE agent (>24hrs) due to surgical blood loss or risk of bleeding: not applicable

## 2013-07-08 NOTE — Progress Notes (Signed)
No pre-op orders in EPIC. Called Dr. Glenna Durandrtmann's office to request orders.

## 2013-07-09 ENCOUNTER — Ambulatory Visit (HOSPITAL_COMMUNITY): Payer: Medicare Other | Admitting: Anesthesiology

## 2013-07-09 ENCOUNTER — Ambulatory Visit (HOSPITAL_COMMUNITY)
Admission: RE | Admit: 2013-07-09 | Discharge: 2013-07-10 | Disposition: A | Discharge status: Home / Self Care | Payer: Medicare Other | Source: Ambulatory Visit | Attending: Orthopedic Surgery | Admitting: Orthopedic Surgery

## 2013-07-09 ENCOUNTER — Encounter (HOSPITAL_COMMUNITY): Payer: Medicare Other | Admitting: Anesthesiology

## 2013-07-09 ENCOUNTER — Encounter (HOSPITAL_COMMUNITY)
Admission: RE | Disposition: A | Discharge status: Home / Self Care | Payer: Self-pay | Source: Ambulatory Visit | Attending: Orthopedic Surgery

## 2013-07-09 ENCOUNTER — Encounter (HOSPITAL_COMMUNITY): Payer: Self-pay | Admitting: Anesthesiology

## 2013-07-09 DIAGNOSIS — M47812 Spondylosis without myelopathy or radiculopathy, cervical region: Secondary | ICD-10-CM | POA: Insufficient documentation

## 2013-07-09 DIAGNOSIS — G1221 Amyotrophic lateral sclerosis: Secondary | ICD-10-CM | POA: Insufficient documentation

## 2013-07-09 DIAGNOSIS — F3289 Other specified depressive episodes: Secondary | ICD-10-CM | POA: Insufficient documentation

## 2013-07-09 DIAGNOSIS — F329 Major depressive disorder, single episode, unspecified: Secondary | ICD-10-CM | POA: Insufficient documentation

## 2013-07-09 DIAGNOSIS — S52502A Unspecified fracture of the lower end of left radius, initial encounter for closed fracture: Secondary | ICD-10-CM | POA: Diagnosis present

## 2013-07-09 DIAGNOSIS — Z87891 Personal history of nicotine dependence: Secondary | ICD-10-CM | POA: Insufficient documentation

## 2013-07-09 DIAGNOSIS — K219 Gastro-esophageal reflux disease without esophagitis: Secondary | ICD-10-CM | POA: Insufficient documentation

## 2013-07-09 DIAGNOSIS — IMO0002 Reserved for concepts with insufficient information to code with codable children: Secondary | ICD-10-CM | POA: Diagnosis present

## 2013-07-09 DIAGNOSIS — I059 Rheumatic mitral valve disease, unspecified: Secondary | ICD-10-CM | POA: Insufficient documentation

## 2013-07-09 DIAGNOSIS — F1210 Cannabis abuse, uncomplicated: Secondary | ICD-10-CM | POA: Insufficient documentation

## 2013-07-09 DIAGNOSIS — F411 Generalized anxiety disorder: Secondary | ICD-10-CM | POA: Insufficient documentation

## 2013-07-09 HISTORY — PX: ORIF WRIST FRACTURE: SHX2133

## 2013-07-09 HISTORY — DX: Constipation, unspecified: K59.00

## 2013-07-09 HISTORY — DX: Pneumonia, unspecified organism: J18.9

## 2013-07-09 LAB — BASIC METABOLIC PANEL
BUN: 17 mg/dL (ref 6–23)
CALCIUM: 9.4 mg/dL (ref 8.4–10.5)
CO2: 25 meq/L (ref 19–32)
Chloride: 105 mEq/L (ref 96–112)
Creatinine, Ser: 1.2 mg/dL — ABNORMAL HIGH (ref 0.50–1.10)
GFR calc non Af Amer: 51 mL/min — ABNORMAL LOW (ref >90)
GFR, EST AFRICAN AMERICAN: 59 mL/min — AB (ref >90)
Glucose, Bld: 99 mg/dL (ref 70–99)
Potassium: 4.2 mEq/L (ref 3.7–5.3)
SODIUM: 140 meq/L (ref 137–147)

## 2013-07-09 LAB — CBC
HCT: 35.5 % — ABNORMAL LOW (ref 36.0–46.0)
Hemoglobin: 12.1 g/dL (ref 12.0–15.0)
MCH: 33 pg (ref 26.0–34.0)
MCHC: 34.1 g/dL (ref 30.0–36.0)
MCV: 96.7 fL (ref 78.0–100.0)
PLATELETS: 216 10*3/uL (ref 150–400)
RBC: 3.67 MIL/uL — AB (ref 3.87–5.11)
RDW: 13.3 % (ref 11.5–15.5)
WBC: 7 10*3/uL (ref 4.0–10.5)

## 2013-07-09 SURGERY — OPEN REDUCTION INTERNAL FIXATION (ORIF) WRIST FRACTURE
Anesthesia: General | Site: Wrist | Laterality: Left

## 2013-07-09 MED ORDER — ONDANSETRON HCL 4 MG/2ML IJ SOLN
INTRAMUSCULAR | Status: AC
  Filled 2013-07-09: qty 2

## 2013-07-09 MED ORDER — FENTANYL CITRATE 0.05 MG/ML IJ SOLN
INTRAMUSCULAR | Status: AC
  Filled 2013-07-09: qty 5

## 2013-07-09 MED ORDER — CEFAZOLIN SODIUM 1-5 GM-% IV SOLN
1.0000 g | Freq: Three times a day (TID) | INTRAVENOUS | Status: DC
  Administered 2013-07-09 – 2013-07-10 (×3): 1 g via INTRAVENOUS
  Filled 2013-07-09 (×6): qty 50

## 2013-07-09 MED ORDER — MORPHINE SULFATE 2 MG/ML IJ SOLN
INTRAMUSCULAR | Status: AC
  Filled 2013-07-09: qty 1

## 2013-07-09 MED ORDER — LACTATED RINGERS IV SOLN: INTRAVENOUS | Status: DC

## 2013-07-09 MED ORDER — BUPIVACAINE HCL (PF) 0.25 % IJ SOLN
INTRAMUSCULAR | Status: DC | PRN
  Administered 2013-07-09: 30 mL

## 2013-07-09 MED ORDER — 0.9 % SODIUM CHLORIDE (POUR BTL) OPTIME
TOPICAL | Status: DC | PRN
  Administered 2013-07-09: 1000 mL

## 2013-07-09 MED ORDER — ONDANSETRON HCL 4 MG PO TABS: 4.0000 mg | ORAL_TABLET | Freq: Four times a day (QID) | ORAL | Status: DC | PRN

## 2013-07-09 MED ORDER — ADULT MULTIVITAMIN W/MINERALS CH
1.0000 | ORAL_TABLET | Freq: Every day | ORAL | Status: DC
  Administered 2013-07-10: 1 via ORAL
  Filled 2013-07-09 (×2): qty 1

## 2013-07-09 MED ORDER — DIPHENHYDRAMINE HCL 25 MG PO CAPS: 25.0000 mg | ORAL_CAPSULE | Freq: Four times a day (QID) | ORAL | Status: DC | PRN

## 2013-07-09 MED ORDER — PROPOFOL 10 MG/ML IV BOLUS
INTRAVENOUS | Status: AC
  Filled 2013-07-09: qty 20

## 2013-07-09 MED ORDER — FENTANYL CITRATE 0.05 MG/ML IJ SOLN: 25.0000 ug | INTRAMUSCULAR | Status: DC | PRN

## 2013-07-09 MED ORDER — METHOCARBAMOL 1000 MG/10ML IJ SOLN
500.0000 mg | Freq: Four times a day (QID) | INTRAMUSCULAR | Status: DC | PRN
  Filled 2013-07-09: qty 5

## 2013-07-09 MED ORDER — BUPIVACAINE HCL (PF) 0.25 % IJ SOLN
INTRAMUSCULAR | Status: AC
  Filled 2013-07-09: qty 30

## 2013-07-09 MED ORDER — ALPRAZOLAM 0.5 MG PO TABS
0.5000 mg | ORAL_TABLET | Freq: Four times a day (QID) | ORAL | Status: DC | PRN
  Administered 2013-07-09 – 2013-07-10 (×2): 0.5 mg via ORAL
  Filled 2013-07-09 (×2): qty 1

## 2013-07-09 MED ORDER — PNEUMOCOCCAL VAC POLYVALENT 25 MCG/0.5ML IJ INJ
0.5000 mL | INJECTION | INTRAMUSCULAR | Status: DC
  Filled 2013-07-09: qty 0.5

## 2013-07-09 MED ORDER — VITAMIN C 500 MG PO TABS
1000.0000 mg | ORAL_TABLET | Freq: Every day | ORAL | Status: DC
  Administered 2013-07-10: 1000 mg via ORAL
  Filled 2013-07-09 (×2): qty 2

## 2013-07-09 MED ORDER — LACTATED RINGERS IV SOLN
INTRAVENOUS | Status: DC | PRN
  Administered 2013-07-09 (×2): via INTRAVENOUS

## 2013-07-09 MED ORDER — MIDAZOLAM HCL 2 MG/2ML IJ SOLN
INTRAMUSCULAR | Status: AC
  Filled 2013-07-09: qty 2

## 2013-07-09 MED ORDER — EPHEDRINE SULFATE 50 MG/ML IJ SOLN
INTRAMUSCULAR | Status: DC | PRN
  Administered 2013-07-09: 10 mg via INTRAVENOUS
  Administered 2013-07-09 (×2): 5 mg via INTRAVENOUS

## 2013-07-09 MED ORDER — ROCURONIUM BROMIDE 50 MG/5ML IV SOLN
INTRAVENOUS | Status: AC
  Filled 2013-07-09: qty 1

## 2013-07-09 MED ORDER — HYDROCODONE-ACETAMINOPHEN 7.5-325 MG PO TABS: 1.0000 | ORAL_TABLET | ORAL | Status: DC | PRN

## 2013-07-09 MED ORDER — MEPERIDINE HCL 25 MG/ML IJ SOLN: 6.2500 mg | INTRAMUSCULAR | Status: DC | PRN

## 2013-07-09 MED ORDER — LIDOCAINE HCL (CARDIAC) 10 MG/ML IV SOLN
INTRAVENOUS | Status: DC | PRN
  Administered 2013-07-09: 80 mg via INTRAVENOUS

## 2013-07-09 MED ORDER — DOCUSATE SODIUM 100 MG PO CAPS
100.0000 mg | ORAL_CAPSULE | Freq: Two times a day (BID) | ORAL | Status: DC
  Administered 2013-07-09 – 2013-07-10 (×2): 100 mg via ORAL
  Filled 2013-07-09 (×3): qty 1

## 2013-07-09 MED ORDER — PROPOFOL 10 MG/ML IV BOLUS
INTRAVENOUS | Status: DC | PRN
  Administered 2013-07-09: 20 mg via INTRAVENOUS
  Administered 2013-07-09: 30 mg via INTRAVENOUS
  Administered 2013-07-09: 160 mg via INTRAVENOUS

## 2013-07-09 MED ORDER — OXYCODONE-ACETAMINOPHEN 5-325 MG PO TABS
1.0000 | ORAL_TABLET | ORAL | Status: DC | PRN
  Administered 2013-07-10: 1 via ORAL
  Administered 2013-07-10 (×3): 2 via ORAL
  Filled 2013-07-09: qty 2
  Filled 2013-07-09: qty 1
  Filled 2013-07-09 (×2): qty 2

## 2013-07-09 MED ORDER — DOCUSATE SODIUM 100 MG PO CAPS: 100.0000 mg | ORAL_CAPSULE | Freq: Two times a day (BID) | ORAL | Status: DC

## 2013-07-09 MED ORDER — CHLORHEXIDINE GLUCONATE 4 % EX LIQD
60.0000 mL | Freq: Once | CUTANEOUS | Status: DC
  Filled 2013-07-09: qty 60

## 2013-07-09 MED ORDER — FENTANYL CITRATE 0.05 MG/ML IJ SOLN
INTRAMUSCULAR | Status: AC
  Filled 2013-07-09: qty 2

## 2013-07-09 MED ORDER — MORPHINE SULFATE 2 MG/ML IJ SOLN
1.0000 mg | INTRAMUSCULAR | Status: DC | PRN
  Administered 2013-07-09: 1 mg via INTRAVENOUS

## 2013-07-09 MED ORDER — MIDAZOLAM HCL 5 MG/5ML IJ SOLN
INTRAMUSCULAR | Status: DC | PRN
  Administered 2013-07-09: 2 mg via INTRAVENOUS

## 2013-07-09 MED ORDER — METOCLOPRAMIDE HCL 5 MG/ML IJ SOLN: 10.0000 mg | Freq: Once | INTRAMUSCULAR | Status: DC | PRN

## 2013-07-09 MED ORDER — LIDOCAINE HCL (CARDIAC) 20 MG/ML IV SOLN
INTRAVENOUS | Status: AC
  Filled 2013-07-09: qty 5

## 2013-07-09 MED ORDER — METHOCARBAMOL 500 MG PO TABS: 500.0000 mg | ORAL_TABLET | Freq: Four times a day (QID) | ORAL | Status: DC

## 2013-07-09 MED ORDER — CEFAZOLIN SODIUM 1-5 GM-% IV SOLN: 1.0000 g | INTRAVENOUS | Status: DC

## 2013-07-09 MED ORDER — OXYCODONE-ACETAMINOPHEN 10-325 MG PO TABS: 1.0000 | ORAL_TABLET | ORAL | Status: DC | PRN

## 2013-07-09 MED ORDER — ONDANSETRON HCL 4 MG/2ML IJ SOLN: 4.0000 mg | Freq: Four times a day (QID) | INTRAMUSCULAR | Status: DC | PRN

## 2013-07-09 MED ORDER — FENTANYL CITRATE 0.05 MG/ML IJ SOLN
INTRAMUSCULAR | Status: DC | PRN
  Administered 2013-07-09 (×2): 50 ug via INTRAVENOUS
  Administered 2013-07-09: 100 ug via INTRAVENOUS
  Administered 2013-07-09 (×3): 50 ug via INTRAVENOUS

## 2013-07-09 MED ORDER — ONDANSETRON HCL 4 MG/2ML IJ SOLN
INTRAMUSCULAR | Status: DC | PRN
  Administered 2013-07-09: 4 mg via INTRAVENOUS

## 2013-07-09 MED ORDER — VITAMIN C 500 MG PO TABS: 500.0000 mg | ORAL_TABLET | Freq: Every day | ORAL | Status: AC

## 2013-07-09 MED ORDER — ZOLPIDEM TARTRATE 5 MG PO TABS: 5.0000 mg | ORAL_TABLET | Freq: Every evening | ORAL | Status: DC | PRN

## 2013-07-09 MED ORDER — KCL IN DEXTROSE-NACL 20-5-0.45 MEQ/L-%-% IV SOLN
INTRAVENOUS | Status: DC
  Administered 2013-07-10: 06:00:00 via INTRAVENOUS
  Filled 2013-07-09 (×2): qty 1000

## 2013-07-09 MED ORDER — METHOCARBAMOL 500 MG PO TABS
500.0000 mg | ORAL_TABLET | Freq: Four times a day (QID) | ORAL | Status: DC | PRN
  Administered 2013-07-10 (×2): 500 mg via ORAL
  Filled 2013-07-09 (×2): qty 1

## 2013-07-09 SURGICAL SUPPLY — 70 items
BANDAGE ELASTIC 3 VELCRO ST LF (GAUZE/BANDAGES/DRESSINGS) ×3 IMPLANT
BANDAGE ELASTIC 4 VELCRO ST LF (GAUZE/BANDAGES/DRESSINGS) ×3 IMPLANT
BANDAGE GAUZE ELAST BULKY 4 IN (GAUZE/BANDAGES/DRESSINGS) ×3 IMPLANT
BLADE 10 SAFETY STRL DISP (BLADE) ×1 IMPLANT
BLADE SURG ROTATE 9660 (MISCELLANEOUS) IMPLANT
BNDG CMPR 9X4 STRL LF SNTH (GAUZE/BANDAGES/DRESSINGS) ×1
BNDG ESMARK 4X9 LF (GAUZE/BANDAGES/DRESSINGS) ×3 IMPLANT
CORDS BIPOLAR (ELECTRODE) ×3 IMPLANT
COVER SURGICAL LIGHT HANDLE (MISCELLANEOUS) ×3 IMPLANT
CUFF TOURNIQUET SINGLE 18IN (TOURNIQUET CUFF) ×3 IMPLANT
CUFF TOURNIQUET SINGLE 24IN (TOURNIQUET CUFF) IMPLANT
DRAIN TLS ROUND 10FR (DRAIN) IMPLANT
DRAPE OEC MINIVIEW 54X84 (DRAPES) ×3 IMPLANT
DRAPE SURG 17X11 SM STRL (DRAPES) ×3 IMPLANT
DRSG ADAPTIC 3X8 NADH LF (GAUZE/BANDAGES/DRESSINGS) ×1 IMPLANT
DRSG EMULSION OIL 3X3 NADH (GAUZE/BANDAGES/DRESSINGS) ×2 IMPLANT
ELECT REM PT RETURN 9FT ADLT (ELECTROSURGICAL)
ELECTRODE REM PT RTRN 9FT ADLT (ELECTROSURGICAL) IMPLANT
GAUZE XEROFORM 1X8 LF (GAUZE/BANDAGES/DRESSINGS) ×2 IMPLANT
GLOVE BIOGEL PI IND STRL 8.5 (GLOVE) ×1 IMPLANT
GLOVE BIOGEL PI INDICATOR 8.5 (GLOVE) ×2
GLOVE SS BIOGEL STRL SZ 7.5 (GLOVE) IMPLANT
GLOVE SUPERSENSE BIOGEL SZ 7.5 (GLOVE) ×2
GLOVE SURG ORTHO 8.0 STRL STRW (GLOVE) ×3 IMPLANT
GLOVE SURG SS PI 6.5 STRL IVOR (GLOVE) ×2 IMPLANT
GOWN STRL REUS W/ TWL LRG LVL3 (GOWN DISPOSABLE) ×3 IMPLANT
GOWN STRL REUS W/ TWL XL LVL3 (GOWN DISPOSABLE) ×1 IMPLANT
GOWN STRL REUS W/TWL LRG LVL3 (GOWN DISPOSABLE) ×3
GOWN STRL REUS W/TWL XL LVL3 (GOWN DISPOSABLE) ×3
K-WIRE 1.6 (WIRE) ×3
K-WIRE FX5X1.6XNS BN SS (WIRE) ×1
K-Wire   .062 (Wire) ×1 IMPLANT
KIT BASIN OR (CUSTOM PROCEDURE TRAY) ×3 IMPLANT
KIT ROOM TURNOVER OR (KITS) ×3 IMPLANT
KWIRE FX5X1.6XNS BN SS (WIRE) IMPLANT
MANIFOLD NEPTUNE II (INSTRUMENTS) ×1 IMPLANT
NDL HYPO 25X1 1.5 SAFETY (NEEDLE) ×1 IMPLANT
NEEDLE HYPO 25X1 1.5 SAFETY (NEEDLE) ×3 IMPLANT
NS IRRIG 1000ML POUR BTL (IV SOLUTION) ×3 IMPLANT
PACK ORTHO EXTREMITY (CUSTOM PROCEDURE TRAY) ×3 IMPLANT
PAD ARMBOARD 7.5X6 YLW CONV (MISCELLANEOUS) ×6 IMPLANT
PAD CAST 4YDX4 CTTN HI CHSV (CAST SUPPLIES) ×1 IMPLANT
PADDING CAST COTTON 4X4 STRL (CAST SUPPLIES) ×6
PLATE VOLAR RIM NARROW LFT (Plate) ×2 IMPLANT
PUTTY DBM STAGRAFT PLUS 5CC (Putty) ×2 IMPLANT
SCREW  LP NL 2.7X16MM (Screw) ×2 IMPLANT
SCREW LP NL 2.7X16MM (Screw) IMPLANT
SCREW MULTI DIRECTIONAL 2.7X16 (Screw) ×2 IMPLANT
SCREW MULTI DIRECTIONAL 2.7X20 (Screw) ×2 IMPLANT
SCREW MULTI DIRECTIONAL 2.7X22 (Screw) ×2 IMPLANT
SCREW NLOCK 24X2.7 3 LD (Screw) IMPLANT
SCREW NONLOCK 2.7X24 (Screw) ×3 IMPLANT
SOAP 2 % CHG 4 OZ (WOUND CARE) ×3 IMPLANT
SPLINT FIBERGLASS 3X35 (CAST SUPPLIES) ×2 IMPLANT
SPONGE GAUZE 4X4 12PLY (GAUZE/BANDAGES/DRESSINGS) ×1 IMPLANT
SPONGE GAUZE 4X4 12PLY STER LF (GAUZE/BANDAGES/DRESSINGS) ×2 IMPLANT
SUCTION FRAZIER TIP 8 FR DISP (SUCTIONS) ×2
SUCTION TUBE FRAZIER 8FR DISP (SUCTIONS) IMPLANT
SUT FIBERWIRE 3-0 18 TAPR NDL (SUTURE) ×3
SUT PROLENE 4 0 PS 2 18 (SUTURE) ×5 IMPLANT
SUT VIC AB 2-0 FS1 27 (SUTURE) ×3 IMPLANT
SUT VICRYL 4-0 PS2 18IN ABS (SUTURE) ×3 IMPLANT
SUTURE FIBERWR 3-0 18 TAPR NDL (SUTURE) IMPLANT
SYR CONTROL 10ML LL (SYRINGE) IMPLANT
SYSTEM CHEST DRAIN TLS 7FR (DRAIN) IMPLANT
TOWEL OR 17X24 6PK STRL BLUE (TOWEL DISPOSABLE) ×3 IMPLANT
TOWEL OR 17X26 10 PK STRL BLUE (TOWEL DISPOSABLE) ×3 IMPLANT
TUBE CONNECTING 12'X1/4 (SUCTIONS) ×1
TUBE CONNECTING 12X1/4 (SUCTIONS) ×2 IMPLANT
WATER STERILE IRR 1000ML POUR (IV SOLUTION) ×1 IMPLANT

## 2013-07-09 NOTE — Anesthesia Preprocedure Evaluation (Addendum)
Anesthesia Evaluation  Patient identified by MRN, date of birth, ID band Patient awake    Reviewed: Allergy & Precautions, H&P , NPO status , Patient's Chart, lab work & pertinent test results  Airway Mallampati: II TM Distance: >3 FB Neck ROM: Full    Dental  (+) Teeth Intact, Partial Upper   Pulmonary pneumonia -, resolved, former smoker,  breath sounds clear to auscultation  Pulmonary exam normal       Cardiovascular negative cardio ROS  Rhythm:Regular Rate:Normal     Neuro/Psych PSYCHIATRIC DISORDERS Anxiety Depression ALS- diagnosed 5 months ago- no diaphragmatic involvement. Recently has started developing lower extremity weakness. Weakness of left arm and hand    GI/Hepatic Neg liver ROS, GERD-  Medicated and Controlled,(+)     substance abuse  marijuana use,   Endo/Other  negative endocrine ROS  Renal/GU negative Renal ROS  negative genitourinary   Musculoskeletal   Abdominal Normal abdominal exam  (+)   Peds  Hematology  (+) anemia ,   Anesthesia Other Findings   Reproductive/Obstetrics negative OB ROS                       Anesthesia Physical Anesthesia Plan  ASA: III  Anesthesia Plan: General   Post-op Pain Management:    Induction: Intravenous  Airway Management Planned: LMA  Additional Equipment:   Intra-op Plan:   Post-operative Plan: Extubation in OR  Informed Consent: I have reviewed the patients History and Physical, chart, labs and discussed the procedure including the risks, benefits and alternatives for the proposed anesthesia with the patient or authorized representative who has indicated his/her understanding and acceptance.   Dental advisory given  Plan Discussed with: CRNA, Anesthesiologist and Surgeon  Anesthesia Plan Comments: (Would not do block for procedure or for post op pain due to ALS. LMA supreme for airway.)      Anesthesia Quick  Evaluation

## 2013-07-09 NOTE — Transfer of Care (Signed)
Immediate Anesthesia Transfer of Care Note  Patient: Katherine Potts  Procedure(s) Performed: Procedure(s): OPEN REDUCTION INTERNAL FIXATION (ORIF) LEFT  WRIST FRACTURE (Left)  Patient Location: PACU  Anesthesia Type:General  Level of Consciousness: awake, alert  and oriented  Airway & Oxygen Therapy: Patient Spontanous Breathing and Patient connected to nasal cannula oxygen  Post-op Assessment: Report given to PACU RN and Post -op Vital signs reviewed and stable  Post vital signs: Reviewed and stable  Complications: No apparent anesthesia complications

## 2013-07-09 NOTE — Discharge Instructions (Signed)
KEEP BANDAGE CLEAN AND DRY CALL OFFICE FOR F/U APPT [phone] in 14 days Dr Melvyn Novasortmann cell phone [phone] KEEP HAND ELEVATED ABOVE HEART OK TO APPLY ICE TO OPERATIVE AREA CONTACT OFFICE IF ANY WORSENING PAIN OR CONCERNS.

## 2013-07-09 NOTE — Anesthesia Postprocedure Evaluation (Signed)
  Anesthesia Post-op Note  Patient: Katherine Potts  Procedure(s) Performed: Procedure(s): OPEN REDUCTION INTERNAL FIXATION (ORIF) LEFT  WRIST FRACTURE (Left)  Patient Location: PACU  Anesthesia Type:General  Level of Consciousness: awake, alert  and oriented  Airway and Oxygen Therapy: Patient Spontanous Breathing and Patient connected to nasal cannula oxygen  Post-op Pain: mild  Post-op Assessment: Post-op Vital signs reviewed, Patient's Cardiovascular Status Stable, Respiratory Function Stable, Patent Airway, No signs of Nausea or vomiting and Pain level controlled  Post-op Vital Signs: Reviewed and stable  Last Vitals:  Filed Vitals:   07/09/13 1715  BP:   Pulse: 70  Temp:   Resp: 12    Complications: No apparent anesthesia complications

## 2013-07-09 NOTE — Anesthesia Procedure Notes (Signed)
Procedure Name: LMA Insertion Date/Time: 07/09/2013 2:23 PM Performed by: Delia ChimesVOSH, Steele Stracener E Pre-anesthesia Checklist: Patient identified, Timeout performed, Emergency Drugs available, Suction available and Patient being monitored Patient Re-evaluated:Patient Re-evaluated prior to inductionOxygen Delivery Method: Circle system utilized Preoxygenation: Pre-oxygenation with 100% oxygen Intubation Type: IV induction LMA: LMA inserted LMA Size: 4.0 Number of attempts: 1 Tube secured with: Tape Dental Injury: Teeth and Oropharynx as per pre-operative assessment

## 2013-07-10 ENCOUNTER — Encounter (HOSPITAL_COMMUNITY): Payer: Self-pay | Admitting: General Practice

## 2013-07-10 DIAGNOSIS — IMO0002 Reserved for concepts with insufficient information to code with codable children: Secondary | ICD-10-CM | POA: Diagnosis not present

## 2013-07-10 MED ORDER — ENSURE COMPLETE PO LIQD: 237.0000 mL | Freq: Three times a day (TID) | ORAL | Status: DC

## 2013-07-10 MED ORDER — PNEUMOCOCCAL VAC POLYVALENT 25 MCG/0.5ML IJ INJ: 0.5000 mL | INJECTION | INTRAMUSCULAR | Status: DC

## 2013-07-10 MED ORDER — METHOCARBAMOL 500 MG PO TABS: 500.0000 mg | ORAL_TABLET | Freq: Four times a day (QID) | ORAL | Status: DC

## 2013-07-10 NOTE — Evaluation (Signed)
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 Left wrist brachioradialis tendon release, tendon tenotomy. 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 With pt's deficits of decreased use of LUE, increased pain, and decreased balance I am recommending HHOT follow up (due to she does not qualify for SNF due to her pt class), as well as a HH Aide. No further OT needs, we will sign off.    Follow Up Recommendations  SNF (however pt does not qualify due to her "class" of out patient in bed, so recommend HHOT and HHaide)    Equipment Recommendations  None recommended by OT       Precautions / Restrictions Precautions Precautions: Fall Required Braces or Orthoses: Sling Restrictions Weight Bearing Restrictions: Yes LUE Weight Bearing: Non weight bearing Other Position/Activity Restrictions: NWBing through hand/wrist.        Mobility Bed Mobility Overal bed mobility: Needs Assistance Bed Mobility: Supine to Sit     Supine to sit: Min assist     General bed mobility comments: use of rail  Transfers Overall transfer level: Needs assistance Equipment used: 1 person hand held assist Transfers: Sit to/from Stand Sit to Stand: Min assist              Balance Overall balance assessment: Needs assistance;History of Falls Sitting-balance support: Feet supported;Single extremity supported Sitting balance-Leahy Scale: Fair     Standing balance support: Single extremity supported Standing balance-Leahy Scale: Poor                              ADL                                         General ADL Comments: Pt currently max A for all BADLs due to pain and  casting of LUE as well as decreased balance with recent dx of ALS (Nov 2014); pt reports that there is someone to A her prn but she would like to do as much for herself as possible. Pt is aware when getting dressed that she needs to put her LUE in a shirt first and take out last due to having had dealt with her arm being broken as of recent. She does not have any questions about BADLs.               Pertinent Vitals/Pain Pt reports pain as 8/10; pt reports she was pre-medicated, I repositioned her LUE up on pillows and a washcloth roll at hand as well as applied iced before I left the room.     Hand Dominance Right   Extremity/Trunk Assessment Upper Extremity Assessment Upper Extremity Assessment: LUE deficits/detail LUE Deficits / Details: cast from hand to above elbow; decreased AROM at digits (edematous and painful to move); encouraged her to keep moving them using her right hand to A prn; propped on two pillows and a rolled up washcloth at hand LUE Coordination: decreased fine motor;decreased gross motor   Lower Extremity Assessment Lower Extremity Assessment: Defer to PT evaluation       Communication Communication Communication: No difficulties  Cognition Arousal/Alertness: Awake/alert Behavior During Therapy: WFL for tasks assessed/performed Overall Cognitive Status: Within Functional Limits for tasks assessed                        Exercises   Other Exercises Other Exercises: Instructed pt in positioning of LUE for edema control and that she needs to try and move her LUE digits as much as she can on their own and if they aren't moving on there own then she needs to A them with her right hand to make them bend and straighten (doing one finger at at time since she tends to tolerate this better)        Home Living Family/patient expects to be discharged to:: Private residence Living Arrangements: Children;Other relatives Available Help at Discharge:  Family;Available 24 hours/day (son does go out and run errands, not gone for more than an hour at a time) Type of Home: House Home Access: Stairs to enter Entergy CorporationEntrance Stairs-Number of Steps: 1   Home Layout: One level         FirefighterBathroom Toilet: Standard     Home Equipment: Environmental consultantWalker - 2 wheels;Bedside commode;Transport chair          Prior Functioning/Environment Level of Independence: Needs assistance  Gait / Transfers Assistance Needed: Used a RW since she fell in house and transport chair when out and about ADL's / Homemaking Assistance Needed: Mod A since she fell        OT Diagnosis: Generalized weakness;Acute pain   OT Problem List: Decreased strength;Decreased range of motion;Decreased activity tolerance;Impaired balance (sitting and/or standing);Decreased coordination;Obesity;Impaired UE functional use;Increased edema;Pain   OT Treatment/Interventions:      OT Goals(Current goals can be found in the care plan section) Acute Rehab OT Goals Patient Stated Goal: Get my legs to not "lock" up when I stand  OT Frequency:                End of Session Equipment Utilized During Treatment: Gait belt  Activity Tolerance: Patient limited by pain (fear of falling, c/o leg spasms when she stands up) Patient left: in chair;with call bell/phone within reach   Time: 0826-0850 OT Time Calculation (min): 24 min Charges:  OT General Charges $OT Visit: 1 Procedure OT Treatments $Therapeutic Exercise: 8-22 mins G-Codes: OT G-codes **NOT FOR INPATIENT CLASS** Functional Assessment Tool Used: Clinical observation Functional Limitation: Self care Self Care Current Status (W0981(G8987): At least 80 percent but less than 100 percent impaired, limited or restricted Self Care Goal Status ([id number](G8988): At least 80 percent but less than 100 percent impaired, limited or restricted Self Care Discharge Status [phone]): At least 80 percent but less than 100 percent impaired, limited or restricted   Katherine Potts [phone] 07/10/2013, 11:05 PM (late entry for 8:50 AM)

## 2013-07-10 NOTE — Evaluation (Signed)
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 Left wrist brachioradialis tendon release, tendon tenotomy. 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  Pt would benefit from SNF at D/C to maximize independence and improve balance prior to returning to home, however concerned that pt will be denied by insurance.  If pt does not qualify for SNF with need HHPT/OT/RN/Aide/SW.  Pt just recently got a RW, but needs L platform for RW at this time.  Will continue to follow.      Follow Up Recommendations SNF    Equipment Recommendations   (L Platform for RW)    Recommendations for Other Services       Precautions / Restrictions Precautions Precautions: Fall Required Braces or Orthoses: Sling Restrictions Weight Bearing Restrictions: Yes LUE Weight Bearing: Non weight bearing Other Position/Activity Restrictions: NWBing through hand/wrist.        Mobility  Bed Mobility                  Transfers Overall transfer level: Needs assistance Equipment used: Left platform walker Transfers: Sit to/from Stand Sit to Stand: Min assist         General transfer comment: cues for use of R UE, but not L UE.  pt unsteady, but needs A more for balance.    Ambulation/Gait Ambulation/Gait assistance: Min assist Ambulation Distance (Feet): 30 Feet Assistive device: Left platform walker Gait Pattern/deviations: Step-through pattern;Decreased stride length;Decreased dorsiflexion - right;Decreased dorsiflexion - left;Trunk flexed     General Gait Details: pt generally unsteady and indicates "leg cramps" in Bil LEs during standing.  cues for safe use of PFRW and attending to balance.    Stairs            Wheelchair Mobility    Modified Rankin (Stroke Patients Only)        Balance Overall balance assessment: Needs assistance;History of Falls Sitting-balance support: Feet supported;No upper extremity supported Sitting balance-Leahy Scale: Fair     Standing balance support: Single extremity supported Standing balance-Leahy Scale: Poor Standing balance comment: pt only able to stand without UE support for very brief periods of time.                               Pertinent Vitals/Pain L hand.  5/10.      Home Living Family/patient expects to be discharged to:: Private residence Living Arrangements: Children;Other relatives (brother) Available Help at Discharge: Family;Available 24 hours/day (son does go out and run errands--not gone for more than an hour at a time) Type of Home: House Home Access: Stairs to enter   Entergy CorporationEntrance Stairs-Number of Steps: 1 Home Layout: One level Home Equipment: Environmental consultantWalker - 2 wheels;Bedside commode;Transport chair      Prior Function Level of Independence: Needs assistance   Gait / Transfers Assistance Needed: Used a RW since she fell in house and transport chair when out and about  ADL's / Homemaking Assistance Needed: Mod A since she fell        Hand Dominance   Dominant Hand: Right    Extremity/Trunk Assessment   Upper Extremity Assessment: Defer to OT evaluation           Lower Extremity Assessment: Generalized weakness;RLE deficits/detail;LLE deficits/detail RLE Deficits / Details: Strength grossly 4-/5  with increased extensor tone noted, though pt states Neurologists has never mentioned tone to her before.  pt thinks her tone is a "leg cramp" every time she stands up.   LLE Deficits / Details: Strength grossly 4/5 with increased extensor tone noted, though not as tight as R LE.    Cervical / Trunk Assessment: Kyphotic  Communication   Communication: No difficulties  Cognition Arousal/Alertness: Awake/alert Behavior During Therapy: WFL for tasks assessed/performed Overall Cognitive  Status: Within Functional Limits for tasks assessed                      General Comments      Exercises        Assessment/Plan    PT Assessment Patient needs continued PT services  PT Diagnosis Difficulty walking   PT Problem List Decreased strength;Decreased activity tolerance;Decreased balance;Decreased mobility;Decreased coordination;Decreased knowledge of use of DME;Decreased knowledge of precautions;Pain;Impaired tone  PT Treatment Interventions DME instruction;Gait training;Stair training;Functional mobility training;Therapeutic activities;Therapeutic exercise;Balance training;Neuromuscular re-education;Patient/family education   PT Goals (Current goals can be found in the Care Plan section) Acute Rehab PT Goals Patient Stated Goal: Get stronger PT Goal Formulation: With patient Time For Goal Achievement: 07/24/13 Potential to Achieve Goals: Good    Frequency Min 3X/week   Barriers to discharge        Co-evaluation               End of Session Equipment Utilized During Treatment: Gait belt Activity Tolerance: Patient limited by fatigue Patient left: in chair;with call bell/phone within reach Nurse Communication: Mobility status    Functional Assessment Tool Used: Clinical Judgement Functional Limitation: Mobility: Walking and moving around Mobility: Walking and Moving Around Current Status ([id number]([id number]): At least 20 percent but less than 40 percent impaired, limited or restricted Mobility: Walking and Moving Around Goal Status [phone]([id number]): 0 percent impaired, limited or restricted    Time: 0916-0942 PT Time Calculation (min): 26 min   Charges:   PT Evaluation $Initial PT Evaluation Tier I: 1 Procedure PT Treatments $Gait Training: 8-22 mins   PT G Codes:   Functional Assessment Tool Used: Clinical Judgement Functional Limitation: Mobility: Walking and moving around    W. R. BerkleyMegan F Shant Hence, [place] CarolinaPT [phone] 07/10/2013, 11:04 AM

## 2013-07-10 NOTE — Progress Notes (Signed)
INITIAL NUTRITION ASSESSMENT  DOCUMENTATION CODES Per approved criteria  -Not Applicable   INTERVENTION: Ensure Complete po TID, each supplement providing 350 kcals and 13 grams of protein  RD to continue to follow nutrition care plan  NUTRITION DIAGNOSIS: Increased nutrient needs related to wound healing as evidenced by wrist fracture  Goal: Pt to meet >/=90% of estimated nutrition needs   Monitor:  PO intake, supplement acceptance, weight trend, labs   Reason for Assessment: Positive Malnutrition Screening Tool Score   53 y.o. female  Admitting Dx: Closed fracture of left distal radius   ASSESSMENT: Pt presented to office with displaced and angulated distal radius fracture. Pt here for surgery for left wrist deformity. Patient with PMHx significant GERD, depression, MVP, ALS, pneumonia, constipation.   Pt s/p the following on 5/14: OPEN REDUCTION INTERNAL FIXATION (ORIF) LEFT WRIST FRACTURE (Left)  Pt states that her appetite hasn't been great but she was unsure if she had lost any weight recently. States that she is drinking Ensure at home and [employer] Instant Breakfast. Pt states that she has ALS and "they like her BMI to be higher" and this is the reason she drinks supplements. Pt has good appetite today. Pt would like to receive Ensure with meals.   EPIC chart review doesn't reveal weight loss recently. Pt states her usual body weight is around 147 lbs.   Nutrition focused physical exam performed. No muscle or subcutaneous fat depletion noticed.   Elevated Cr  Height: Ht Readings from Last 1 Encounters:  06/29/13 5\' 4"  (1.626 m)    Weight: Wt Readings from Last 1 Encounters:  07/09/13 145 lb (65.772 kg)    Ideal Body Weight: 54.5 kg   % Ideal Body Weight: 121%   Wt Readings from Last 10 Encounters:  07/09/13 145 lb (65.772 kg)  07/09/13 145 lb (65.772 kg)  06/29/13 145 lb (65.772 kg)  06/24/13 141 lb (63.957 kg)  04/10/13 148 lb (67.132 kg)  12/31/12  152 lb 8 oz (69.174 kg)  12/26/12 153 lb 12.8 oz (69.763 kg)  07/02/12 145 lb (65.772 kg)    Usual Body Weight: 147 lbs   % Usual Body Weight: 99%   BMI:  Body mass index is 24.88 kg/(m^2)., Normal   Estimated Nutritional Needs: Kcal: 1700 - 1900 Protein: 85 - 100 grams  Fluid: >/= 1.7 L/day   Skin: Surgical incision left wrist   Diet Order: General  EDUCATION NEEDS: -No education needs identified at this time   Intake/Output Summary (Last 24 hours) at 07/10/13 1527 Last data filed at 07/09/13 1639  Gross per 24 hour  Intake    600 ml  Output     30 ml  Net    570 ml    Last BM: 5/13   Labs:   Recent Labs Lab 07/09/13 1214  NA 140  K 4.2  CL 105  CO2 25  BUN 17  CREATININE 1.20*  CALCIUM 9.4  GLUCOSE 99    CBG (last 3)  No results found for this basename: GLUCAP,  in the last 72 hours  Scheduled Meds: .  ceFAZolin (ANCEF) IV  1 g Intravenous Q8H  . docusate sodium  100 mg Oral BID  . multivitamin with minerals  1 tablet Oral Daily  . pneumococcal 23 valent vaccine  0.5 mL Intramuscular Tomorrow-1000  . vitamin C  1,000 mg Oral Daily    Continuous Infusions: . dextrose 5 % and 0.45 % NaCl with KCl 20 mEq/L 50 mL/hr at  07/10/13 [id number]  . lactated ringers      Past Medical History  Diagnosis Date  . GERD (gastroesophageal reflux disease)   . Anxiety   . Depression   . MVP (mitral valve prolapse)   . Cervical spondylosis   . 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 Pneumonia   . Constipation     Past Surgical History  Procedure Laterality Date  . Eye surgery    . Breast surgery    . Vagina surgery      mesh  . Kidney donation      Right nephrectomy  . Abdominal hernia repair    . Orif wrist fracture  07/09/2013    DR Melvyn NovasTMANN    Katherine Potts, BS Dietetic Intern Pager: [phone]

## 2013-07-10 NOTE — Discharge Summary (Signed)
Physician Discharge Summary  Patient ID: Katherine Potts MRN: [id number] DOB/AGE: November 01, 1960 53 y.o.  Admit date: 07/09/2013 Discharge date: 07/10/2013  Admission Diagnoses: LEFT DISTAL RADIUS FRACTURE  Past Medical History  Diagnosis Date  . GERD (gastroesophageal reflux disease)   . Anxiety   . Depression   . MVP (mitral valve prolapse)   . Cervical spondylosis   . 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 Pneumonia   . Constipation     Discharge Diagnoses:  Active Problems:   Closed fracture of left distal radius   Surgeries: Procedure(s): OPEN REDUCTION INTERNAL FIXATION (ORIF) LEFT  WRIST FRACTURE on 07/09/2013    Consultants:    Discharged Condition: Improved  Hospital Course: Katherine Potts is an 53 y.o. female who was admitted 07/09/2013 with a chief complaint of No chief complaint on file. , and found to have a diagnosis of LEFT DISTAL RADIUS FRACTURE .  They were brought to the operating room on 07/09/2013 and underwent Procedure(s): OPEN REDUCTION INTERNAL FIXATION (ORIF) LEFT  WRIST FRACTURE.    They were given perioperative antibiotics: Anti-infectives   Start     Dose/Rate Route Frequency Ordered Stop   07/09/13 2000  ceFAZolin (ANCEF) IVPB 1 g/50 mL premix  Status:  Discontinued     1 g 100 mL/hr over 30 Minutes Intravenous NOW 07/09/13 1759 07/09/13 1808   07/09/13 2000  ceFAZolin (ANCEF) IVPB 1 g/50 mL premix     1 g 100 mL/hr over 30 Minutes Intravenous Every 8 hours 07/09/13 1759     07/09/13 0600  ceFAZolin (ANCEF) IVPB 2 g/50 mL premix     2 g 100 mL/hr over 30 Minutes Intravenous On call to O.R. 07/08/13 1649 07/09/13 1425    .  They were given sequential compression devices, early ambulation, and Other (comment) ambulation for DVT prophylaxis.  Recent vital signs: Patient Vitals for the past 24 hrs:  BP Temp Temp src Pulse Resp SpO2  07/10/13 1547 93/68 mmHg 98.1 F (36.7 C) Oral 71 16 93 %  07/10/13 0634  130/79 mmHg 97.8 F (36.6 C) - 62 18 99 %  07/09/13 2117 125/76 mmHg 97.7 F (36.5 C) - 80 18 98 %  07/09/13 1740 120/71 mmHg 97.5 F (36.4 C) Oral 82 11 96 %  07/09/13 1730 93/66 mmHg 98.1 F (36.7 C) - 80 13 97 %  07/09/13 1715 - - - 70 12 93 %  07/09/13 1712 - - - 72 14 94 %  07/09/13 1709 104/79 mmHg - - 76 13 97 %  07/09/13 1700 - - - 78 14 97 %  07/09/13 1650 99/63 mmHg 98.4 F (36.9 C) - 94 18 96 %  .  Recent laboratory studies: No results found.  Discharge Medications:     Medication List         calcium carbonate 500 MG chewable tablet  Commonly known as:  TUMS - dosed in mg elemental calcium  Chew 1 tablet by mouth daily.     ciprofloxacin 250 MG tablet  Commonly known as:  CIPRO  Take 1 tablet (250 mg total) by mouth 2 (two) times daily.     diazepam 5 MG tablet  Commonly known as:  VALIUM  Take 1 tablet (5 mg total) by mouth every 12 (twelve) hours as needed for muscle spasms.     docusate sodium 100 MG capsule  Commonly known as:  COLACE  Take 1 capsule (100 mg total) by mouth  2 (two) times daily.     esomeprazole 40 MG capsule  Commonly known as:  NEXIUM  Take 40 mg by mouth daily before breakfast.     HYDROmorphone 4 MG tablet  Commonly known as:  DILAUDID  Take by mouth every 4 (four) hours as needed for severe pain.     LORazepam 1 MG tablet  Commonly known as:  ATIVAN  Take 2 mg by mouth 3 (three) times daily as needed for anxiety.     methocarbamol 500 MG tablet  Commonly known as:  ROBAXIN  Take 1 tablet (500 mg total) by mouth 4 (four) times daily.     methocarbamol 500 MG tablet  Commonly known as:  ROBAXIN  Take 1 tablet (500 mg total) by mouth 4 (four) times daily.     metoCLOPramide 5 MG tablet  Commonly known as:  REGLAN  Take 1 tablet (5 mg total) by mouth 3 (three) times daily before meals.     ondansetron 4 MG tablet  Commonly known as:  ZOFRAN  Take 4 mg by mouth every 6 (six) hours as needed for nausea.      oxyCODONE-acetaminophen 5-325 MG per tablet  Commonly known as:  ROXICET  Take 1 tablet by mouth every 6 (six) hours as needed for severe pain.     oxyCODONE-acetaminophen 10-325 MG per tablet  Commonly known as:  PERCOCET  Take 1 tablet by mouth every 4 (four) hours as needed for pain.     promethazine 25 MG tablet  Commonly known as:  PHENERGAN  Take 1 tablet (25 mg total) by mouth every 6 (six) hours as needed for nausea or vomiting.     vitamin C 500 MG tablet  Commonly known as:  ASCORBIC ACID  Take 1 tablet (500 mg total) by mouth daily.        Diagnostic Studies: Dg Thoracic Spine 2 View  06/29/2013   CLINICAL DATA:  Trauma, fall.  Upper back pain.  EXAM: THORACIC SPINE - 2 VIEW  COMPARISON:  None.  FINDINGS: Vertebral bodies are normally aligned with preservation of the normal thoracic kyphosis. Vertebral body heights are preserved. No acute fracture or listhesis.  Moderate degenerative endplate spurring seen anteriorly within the mid and lower thoracic spine. No paraspinous soft tissue abnormality.  Subcentimeter calcific density overlies the left upper quadrant, of uncertain clinical significance.  IMPRESSION: No radiographic evidence of acute traumatic injury within the thoracic spine.   Electronically Signed   By: Rise MuBenjamin  McClintock M.D.   On: 06/29/2013 16:53   Dg Wrist Complete Left  06/19/2013   CLINICAL DATA:  Recent fall, wrist pain  EXAM: LEFT WRIST - COMPLETE 3+ VIEW  COMPARISON:  None.  FINDINGS: There is a slightly impacted and angulated fracture of the distal left radius. Also, there is a slightly displaced fracture of the ulnar styloid. The carpal bones are in normal position.  IMPRESSION: 1. Transverse slightly impacted and angulated fracture of the distal left radius. 2. Slightly displaced fracture of the ulnar styloid.   Electronically Signed   By: Dwyane DeePaul  Barry M.D.   On: 06/19/2013 15:53    They benefited maximally from their hospital stay and there were no  complications.     Disposition: 01-Home or Self Care      Follow-up Information   Follow up with Sharma CovertTMANN,Tameisha Covell W, MD In 2 weeks.   Specialty:  Orthopedic Surgery   Contact information:   [address] [place] [address] [phone]  Signed: Sharma CovertFred W Thuy Atilano 07/10/2013, 4:19 PM

## 2013-07-10 NOTE — Op Note (Signed)
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  ATTENDING PHYSICIAN:  Madelynn DoneFred W Arlis Everly IV, MD, who scrubbed and present for the entire procedure.  ASSISTANT SURGEON:  None.  ANESTHESIA:  General via LMA.  TOURNIQUET TIME:  Less than 2 hours at 250 mmHg.  SURGICAL PROCEDURES: 1. Open treatment of left intra-articular distal radius nascent     malunion, requiring internal fixation. 2. Left wrist brachioradialis tendon release, tendon tenotomy. 3. Left wrist radiograph 3 views, left wrist.  SURGICAL IMPLANTS:  [employer]omet DVR Crosslock volar rim plate with one [id number] K-wire.  SURGICAL INDICATIONS:  [name] is a right-hand dominant female who sustained a fall on outstretched left wrist approximately 3 weeks ago. The patient sustained the comminuted intra-articular distal radius fracture of three or more fragments.  This was a complex injury that will presented to the office several weeks after her injury with the malunion noted.  The patient was seen and evaluated in the office and recommended to undergo the above procedure.  Risks, benefits, and alternatives were discussed in detail with the patient.  Signed informed consent was obtained.  Risks include, but not limited to bleeding; infection; damage to nearby nerves, arteries, or tendons; nonunion; malunion; hardware failure; loss of motion of the wrist and digits; incomplete relief of symptoms; and need for further  surgical intervention.  DESCRIPTION OF PROCEDURE:  The patient was properly identified in the preoperative holding area and marked with a permanent marker made on the left wrist to indicate the correct operative site.  The patient was then brought back to the operating room and placed supine on anesthesia room table, general anesthesia was administered.  The patient tolerated this well.  The patient received preoperative antibiotics prior to skin incision.  A well-padded tourniquet was then placed on the left brachium and sealed with 1000-drape.  Left upper extremity was then prepped and draped in normal sterile fashion.  Time-out was called.  Correct site was identified and the procedure was then begun.  Attention was then turned to the left wrist.  The limb was then elevated and tourniquet insufflated.  Longitudinal incision was made directly over the FCR sheath.  Dissection was carried down through the floor of the FCR sheath where the pronator quadratus was then elevated in an L-shaped fashion and malunion.  Nascent malunion was then exposed.  Takedown of the malunion was then carried out using small Yvonne Kendallogers, Freer, elevators and curettes.  Following this in order to obtain any type of length along the radial column, brachioradialis was released off the radial styloid. Careful protection of the first dorsal compartment tendons was then carried out and release of the brachioradialis, tendon lengthening and tenotomy was then carried out.  Following this, the  fracture site was then carefully opened up.  Longitudinal traction was found following the radius out to length.  Again, this was a very complex intra-articular fracture of three or more fragments.  Once this was carried out, several mL of stay graft, bone graft substitute was then used and filled into the defect.  After packing of the allograft bone graft, attention was then turned to internal fixation.  The DVR Crosslock plate was  then applied on the volar surface.  Plate height was then adjusted using the mini C-arm and the oblong screw hole was then placed proximally.  The volar rim plate was then applied.  Again, this was a very distal fracture without significant shortening.  Given the timeframe from this injury, injury was much stiffer than an acute fracture.  Open reduction was then carried out.  Following this, this was held in place with a K- wire.  Once the radial column was held in place and the K-wire, the plate was then applied and the oblong screw hole was then used.  After fixation of this with the oblong screw hole, and distal fixation was then achieved, then the distal row was then carried out moving from an ulnar to radial direction.  Radiographs confirmed placement of the screws beneath the subchondral surface.  Following this, the FiberWire suture was then used through the distal holes in order to capture the volar capsule, ligament structure for local tissue reinforcement.  Once this was carried out, proximal fixation was then carried out with multidirectional screws in the proximal row, being sure not to penetrate the articular margin.  This was done under x-ray guidance.  Following this, proximal fixation was then carried out with a combination of locking and nonlocking screws.  The wound was then thoroughly irrigated. Final radiographs were then obtained.  Pronator quadratus was then repaired using 2-0 Vicryl suture, subcutaneous tissue was closed with 4- 0 Vicryl.  Skin was then closed using simple Prolene sutures.  Pin site was then cut and bent, left out of the skin.  Xeroform dressing was then applied.  Sterile compressive bandage was applied.  The patient was then placed in a well-padded sugar-tong splint, extubated, and taken to the recovery room in good condition.  Intraoperative radiographs 3 views of the wrist did show the internal fixation in place.  There was good position in all  planes.  POSTPROCEDURAL PLAN:  The patient will be admitted overnight for IV antibiotics and pain control.  Discharged in the morning and seen back in the office in approximately 2 weeks for wound check, x-rays, application of a short-arm cast for total of 5 weeks, and then begin a therapy regimen the 5-week mark, radiographs at each visit.     Madelynn DoneFred W Izzie Geers IV, MD     FWO/MEDQ  D:  07/09/2013  T:  07/10/2013  Job:  [id number]

## 2013-07-10 NOTE — Care Management Note (Signed)
CARE MANAGEMENT NOTE 07/10/2013  Patient:  Katherine Potts,Katherine Potts   Account Number:  [id number]  Date Initiated:  07/10/2013  Documentation initiated by:  Vance PeperBRADY,Emalia Witkop  Subjective/Objective Assessment:   53 yr old female s/p left wrist ORIF. Patient has Hx. of ALS     Action/Plan:   Case manager spoke with patient concerning home health and DME needs. Patient states she is active with [facility]. CM called Jodene NamMary Hickling to inform of need to resume HH. Patient needs wheelchair. Order entered. Will be delivered to home.   Anticipated DC Date:  07/10/2013   Anticipated DC Plan:  HOME W HOME HEALTH SERVICES      DC Planning Services  CM consult      [facility]  Resumption Of Svcs/PTA Provider  DURABLE MEDICAL EQUIPMENT  HOME HEALTH   Choice offered to / List presented to:  C-1 Patient   DME arranged  WHEELCHAIR - MANUAL      DME agency  [facility]     HH arranged  HH-1 RN  HH-2 PT  HH-3 OT  HH-4 NURSE'S AIDE  HH-6 SOCIAL WORKER      HH agency  [facility]   Status of service:  Completed, signed off Medicare Important Message given?  NA - LOS <3 / Initial given by admissions (If response is "NO", the following Medicare IM given date fields will be blank) Date Medicare IM given:   Date Additional Medicare IM given:    Discharge Disposition:  HOME W HOME HEALTH SERVICES

## 2013-07-21 ENCOUNTER — Telehealth: Payer: Self-pay | Admitting: Neurology

## 2013-07-21 NOTE — Telephone Encounter (Signed)
Patient called and stated oxyCODONE-acetaminophen (PERCOCET) 10-325 MG per tablet makes her nauseous.  Please call and advise

## 2013-07-22 ENCOUNTER — Telehealth: Payer: Self-pay

## 2013-07-22 MED ORDER — DIAZEPAM 5 MG PO TABS: 5.0000 mg | ORAL_TABLET | Freq: Two times a day (BID) | ORAL | Status: DC | PRN

## 2013-07-22 MED ORDER — PROMETHAZINE HCL 25 MG PO TABS: 25.0000 mg | ORAL_TABLET | Freq: Four times a day (QID) | ORAL | Status: DC | PRN

## 2013-07-22 NOTE — Telephone Encounter (Signed)
I called the patient. The patient is having ongoing nausea, decreased oral intake. Urinalysis done previously did not show evidence of a bladder infection. The patient is on Dilaudid for pain, and she is not taking the oxycodone. I will renew the prescription for diazepam and for Phenergan.

## 2013-07-22 NOTE — Telephone Encounter (Signed)
I spoke with the patient who says the pain meds are making her nauseated.  States she cannot take them with food, as she has no appetite.  Indicates a different provider previously prescribed Phenergan, which helped.  She would like to know if we could prescribe this, or if something different should be done.  As well, she would like a message relayed to Dr Anne Hahn that she believes she has a UTI and is not able to take Cipro or Bactrim.  Please advise.  Thank you.

## 2013-08-04 ENCOUNTER — Telehealth: Payer: Self-pay | Admitting: Neurology

## 2013-08-04 NOTE — Telephone Encounter (Signed)
Darl Pikes Occupational Therapist with [facility] requesting verbal order for 2 additional visits for Home Health OT. Please return call [phone].

## 2013-08-04 NOTE — Telephone Encounter (Signed)
I called the occupational therapist, okay for another 2 visits.

## 2013-08-04 NOTE — Telephone Encounter (Signed)
Susan Occupational Therapist with [facility] requesting verbal order for 2 additional visits for Home Health OT. Please return call [phone].  

## 2013-09-09 ENCOUNTER — Other Ambulatory Visit: Payer: Self-pay | Admitting: Neurology

## 2013-09-24 ENCOUNTER — Telehealth: Payer: Self-pay | Admitting: Neurology

## 2013-09-24 ENCOUNTER — Ambulatory Visit: Payer: Medicare Other | Admitting: Neurology

## 2013-09-24 NOTE — Telephone Encounter (Signed)
This patient did not show for a revisit appointment today. 

## 2013-10-08 ENCOUNTER — Telehealth: Payer: Self-pay | Admitting: Neurology

## 2013-10-08 ENCOUNTER — Ambulatory Visit: Payer: BC Managed Care – PPO | Admitting: Neurology

## 2013-10-08 DIAGNOSIS — G1221 Amyotrophic lateral sclerosis: Secondary | ICD-10-CM

## 2013-10-13 NOTE — Telephone Encounter (Signed)
I called patient. The patient missed a recent appointment on 09/24/2013. She is having significant issues with functioning at home. She has deteriorated significantly since last seen 3 or 4 months ago. She now has quite limited ability to ambulate, mainly using a wheelchair at this point. She is losing the use of her hands. She requires assistance with bathing, dressing, and feeding. She denies any issues with swallowing. She wishes to have physical and occupational therapy evaluation. This may be reasonable to help improve her independence and care and home, and minimize safety issues. I'll try to get this set up.

## 2013-10-16 NOTE — Progress Notes (Signed)
I agree with the Student-Dietitian note and made appropriate revisions.  Katie Sojourner Behringer, RD, LDN Pager #: [phone] After-Hours Pager #: [phone]  

## 2013-10-18 ENCOUNTER — Encounter (HOSPITAL_COMMUNITY): Payer: Self-pay | Admitting: Emergency Medicine

## 2013-10-18 ENCOUNTER — Emergency Department (HOSPITAL_COMMUNITY)
Admission: EM | Admit: 2013-10-18 | Discharge: 2013-10-18 | Disposition: A | Discharge status: Home / Self Care | Payer: Medicare Other | Source: Home / Self Care | Attending: Emergency Medicine | Admitting: Emergency Medicine

## 2013-10-18 DIAGNOSIS — R197 Diarrhea, unspecified: Principal | ICD-10-CM

## 2013-10-18 DIAGNOSIS — R112 Nausea with vomiting, unspecified: Secondary | ICD-10-CM

## 2013-10-18 DIAGNOSIS — R1013 Epigastric pain: Secondary | ICD-10-CM | POA: Diagnosis not present

## 2013-10-18 DIAGNOSIS — K560 Paralytic ileus: Secondary | ICD-10-CM | POA: Diagnosis not present

## 2013-10-18 LAB — CBC WITH DIFFERENTIAL/PLATELET
Basophils Absolute: 0 10*3/uL (ref 0.0–0.1)
Basophils Relative: 0 % (ref 0–1)
Eosinophils Absolute: 0 10*3/uL (ref 0.0–0.7)
Eosinophils Relative: 0 % (ref 0–5)
HCT: 37.5 % (ref 36.0–46.0)
HEMOGLOBIN: 12.7 g/dL (ref 12.0–15.0)
LYMPHS PCT: 20 % (ref 12–46)
Lymphs Abs: 1.6 10*3/uL (ref 0.7–4.0)
MCH: 31.8 pg (ref 26.0–34.0)
MCHC: 33.9 g/dL (ref 30.0–36.0)
MCV: 94 fL (ref 78.0–100.0)
MONOS PCT: 4 % (ref 3–12)
Monocytes Absolute: 0.3 10*3/uL (ref 0.1–1.0)
NEUTROS ABS: 6 10*3/uL (ref 1.7–7.7)
NEUTROS PCT: 76 % (ref 43–77)
PLATELETS: 191 10*3/uL (ref 150–400)
RBC: 3.99 MIL/uL (ref 3.87–5.11)
RDW: 12.3 % (ref 11.5–15.5)
WBC: 7.9 10*3/uL (ref 4.0–10.5)

## 2013-10-18 LAB — URINALYSIS, ROUTINE W REFLEX MICROSCOPIC
BILIRUBIN URINE: NEGATIVE
Glucose, UA: NEGATIVE mg/dL
HGB URINE DIPSTICK: NEGATIVE
KETONES UR: NEGATIVE mg/dL
NITRITE: NEGATIVE
PH: 6 (ref 5.0–8.0)
Protein, ur: NEGATIVE mg/dL
SPECIFIC GRAVITY, URINE: 1.013 (ref 1.005–1.030)
UROBILINOGEN UA: 0.2 mg/dL (ref 0.0–1.0)

## 2013-10-18 LAB — COMPREHENSIVE METABOLIC PANEL
ALK PHOS: 65 U/L (ref 39–117)
ALT: 23 U/L (ref 0–35)
AST: 24 U/L (ref 0–37)
Albumin: 3.8 g/dL (ref 3.5–5.2)
Anion gap: 16 — ABNORMAL HIGH (ref 5–15)
BILIRUBIN TOTAL: 0.3 mg/dL (ref 0.3–1.2)
BUN: 23 mg/dL (ref 6–23)
CHLORIDE: 103 meq/L (ref 96–112)
CO2: 22 mEq/L (ref 19–32)
Calcium: 9.5 mg/dL (ref 8.4–10.5)
Creatinine, Ser: 1.28 mg/dL — ABNORMAL HIGH (ref 0.50–1.10)
GFR calc non Af Amer: 47 mL/min — ABNORMAL LOW (ref >90)
GFR, EST AFRICAN AMERICAN: 55 mL/min — AB (ref >90)
GLUCOSE: 109 mg/dL — AB (ref 70–99)
POTASSIUM: 3.5 meq/L — AB (ref 3.7–5.3)
SODIUM: 141 meq/L (ref 137–147)
Total Protein: 7 g/dL (ref 6.0–8.3)

## 2013-10-18 LAB — URINE MICROSCOPIC-ADD ON

## 2013-10-18 MED ORDER — SODIUM CHLORIDE 0.9 % IV BOLUS (SEPSIS)
500.0000 mL | Freq: Once | INTRAVENOUS | Status: AC
  Administered 2013-10-18: 500 mL via INTRAVENOUS

## 2013-10-18 MED ORDER — PROMETHAZINE HCL 25 MG/ML IJ SOLN
25.0000 mg | Freq: Once | INTRAMUSCULAR | Status: AC
  Administered 2013-10-18: 25 mg via INTRAVENOUS
  Filled 2013-10-18: qty 1

## 2013-10-18 MED ORDER — POTASSIUM CHLORIDE CRYS ER 20 MEQ PO TBCR
40.0000 meq | EXTENDED_RELEASE_TABLET | Freq: Once | ORAL | Status: AC
  Administered 2013-10-18: 40 meq via ORAL
  Filled 2013-10-18: qty 2

## 2013-10-18 MED ORDER — ONDANSETRON HCL 4 MG PO TABS: 4.0000 mg | ORAL_TABLET | Freq: Four times a day (QID) | ORAL | Status: DC

## 2013-10-18 MED ORDER — ONDANSETRON HCL 4 MG/2ML IJ SOLN
4.0000 mg | Freq: Once | INTRAMUSCULAR | Status: AC
  Administered 2013-10-18: 4 mg via INTRAVENOUS
  Filled 2013-10-18: qty 2

## 2013-10-18 MED ORDER — ONDANSETRON HCL 4 MG PO TABS: 4.0000 mg | ORAL_TABLET | Freq: Four times a day (QID) | ORAL | Status: AC

## 2013-10-18 MED ORDER — SODIUM CHLORIDE 0.9 % IV BOLUS (SEPSIS)
1000.0000 mL | Freq: Once | INTRAVENOUS | Status: DC
Start: 2013-10-18 — End: 2013-10-18

## 2013-10-18 MED ORDER — DIAZEPAM 5 MG/ML IJ SOLN
2.5000 mg | Freq: Once | INTRAMUSCULAR | Status: AC
  Administered 2013-10-18: 2.5 mg via INTRAVENOUS
  Filled 2013-10-18: qty 2

## 2013-10-18 NOTE — ED Provider Notes (Signed)
CSN: [id number]     Arrival date & time 10/18/13  0840 History   First MD Initiated Contact with Patient 10/18/13 [phone]     Chief Complaint  Patient presents with  . Nausea  . Emesis     (Consider location/radiation/quality/duration/timing/severity/associated sxs/prior Treatment) HPI Ms. Tollett is a 53 year old female with past medical history of GERD, anxiety depression, ALS, constipation who presents with nausea, vomiting, diarrhea since last night. Patient states her symptoms were sudden in onset, have been persistent, and have been alleviated slightly by PO Phenergan at home. Patient denies having sick contacts and denies eating anything unusual. Patient denies abdominal pain, flank pain, dysuria, shortness of breath, chest pain.  Past Medical History  Diagnosis Date  . GERD (gastroesophageal reflux disease)   . Anxiety   . Depression   . MVP (mitral valve prolapse)   . Cervical spondylosis   . 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 Pneumonia   . Constipation    Past Surgical History  Procedure Laterality Date  . Eye surgery    . Breast surgery    . Vagina surgery      mesh  . Kidney donation      Right nephrectomy  . Abdominal hernia repair    . Orif wrist fracture  07/09/2013    DR Melvyn Novas  . Orif wrist fracture Left 07/09/2013    Procedure: OPEN REDUCTION INTERNAL FIXATION (ORIF) LEFT  WRIST FRACTURE;  Surgeon: Sharma Covert, MD;  Location: [facility] OR;  Service: Orthopedics;  Laterality: Left;   Family History  Problem Relation Age of Onset  . Bipolar disorder Brother   . Dementia Mother   . Hypertension Father   . ALS Cousin    History  Substance Use Topics  . Smoking status: Former Smoker -- 0.40 packs/day    Types: Cigarettes, Cigars  . Smokeless tobacco: Never Used  . Alcohol Use: No     Comment: none   OB History   Grav Para Term Preterm Abortions TAB SAB Ect Mult Living                 Review of Systems   Constitutional: Negative for fever.  HENT: Negative for trouble swallowing.   Eyes: Negative for visual disturbance.  Respiratory: Negative for shortness of breath.   Cardiovascular: Negative for chest pain.  Gastrointestinal: Positive for nausea, vomiting and diarrhea. Negative for abdominal pain.  Genitourinary: Negative for dysuria.  Musculoskeletal: Negative for neck pain.  Skin: Negative for rash.  Neurological: Negative for dizziness, weakness and numbness.  Psychiatric/Behavioral: Negative.       Allergies  Bactrim and Ciprofloxacin  Home Medications   Prior to Admission medications   Medication Sig Start Date End Date Taking? Authorizing Provider  ARIPiprazole (ABILIFY) 2 MG tablet Take 2 mg by mouth daily. 09/28/13  Yes Historical Provider, MD  calcium carbonate (TUMS - DOSED IN MG ELEMENTAL CALCIUM) 500 MG chewable tablet Chew 1 tablet by mouth daily.   Yes Historical Provider, MD  diazepam (VALIUM) 5 MG tablet Take 1 tablet (5 mg total) by mouth every 12 (twelve) hours as needed for muscle spasms. 07/22/13  Yes York Spaniel, MD  docusate sodium (COLACE) 100 MG capsule Take 1 capsule (100 mg total) by mouth 2 (two) times daily. 07/09/13  Yes Sharma Covert, MD  esomeprazole (NEXIUM) 40 MG capsule Take 40 mg by mouth daily before breakfast.   Yes Historical Provider, MD  FLUoxetine (PROZAC) 20  MG capsule Take 20 mg by mouth daily. 09/29/13  Yes Historical Provider, MD  LORazepam (ATIVAN) 1 MG tablet Take 2 mg by mouth 3 (three) times daily as needed for anxiety.   Yes Historical Provider, MD  LYRICA 150 MG capsule Take 150 mg by mouth 3 (three) times daily. 10/09/13  Yes Historical Provider, MD  methocarbamol (ROBAXIN) 500 MG tablet Take 1 tablet (500 mg total) by mouth 4 (four) times daily. 07/10/13  Yes Sharma Covert, MD  promethazine (PHENERGAN) 25 MG tablet Take 25 mg by mouth every 6 (six) hours as needed for nausea or vomiting.   Yes Historical Provider, MD  vitamin C  (ASCORBIC ACID) 500 MG tablet Take 1 tablet (500 mg total) by mouth daily. 07/09/13  Yes Sharma Covert, MD  ondansetron (ZOFRAN) 4 MG tablet Take 1 tablet (4 mg total) by mouth every 6 (six) hours. 10/18/13   Monte Fantasia, PA-C  ondansetron (ZOFRAN) 4 MG tablet Take 1 tablet (4 mg total) by mouth every 6 (six) hours. 10/18/13   Toy Cookey, MD   BP 131/72  Pulse 52  Temp(Src) 98.6 F (37 C) (Oral)  Resp 18  SpO2 100% Physical Exam  Constitutional: She is oriented to person, place, and time. She appears well-developed and well-nourished. No distress.  HENT:  Head: Normocephalic and atraumatic.  Mouth/Throat: Oropharynx is clear and moist. No oropharyngeal exudate.  Eyes: Right eye exhibits no discharge. Left eye exhibits no discharge. No scleral icterus.  Neck: Normal range of motion.  Cardiovascular: Normal rate, regular rhythm and normal heart sounds.   No murmur heard. Pulmonary/Chest: Effort normal and breath sounds normal. No respiratory distress.  Abdominal: Soft. Normal appearance and bowel sounds are normal. There is no tenderness.  Musculoskeletal: Normal range of motion. She exhibits no edema and no tenderness.  Neurological: She is alert and oriented to person, place, and time. No cranial nerve deficit or sensory deficit. Coordination normal.  Skin: Skin is warm and dry. No rash noted. She is not diaphoretic.  Psychiatric: She has a normal mood and affect.    ED Course  Procedures (including critical care time) Labs Review Labs Reviewed  COMPREHENSIVE METABOLIC PANEL - Abnormal; Notable for the following:    Potassium 3.5 (*)    Glucose, Bld 109 (*)    Creatinine, Ser 1.28 (*)    GFR calc non Af Amer 47 (*)    GFR calc Af Amer 55 (*)    Anion gap 16 (*)    All other components within normal limits  URINALYSIS, ROUTINE W REFLEX MICROSCOPIC - Abnormal; Notable for the following:    APPearance CLOUDY (*)    Leukocytes, UA SMALL (*)    All other components within  normal limits  URINE MICROSCOPIC-ADD ON - Abnormal; Notable for the following:    Squamous Epithelial / LPF FEW (*)    All other components within normal limits  URINE CULTURE  CBC WITH DIFFERENTIAL    Imaging Review No results found.   EKG Interpretation None      MDM   Final diagnoses:  Nausea vomiting and diarrhea   53 year old female with past medical history of GERD, ALS, anxiety, depression presenting with nausea, vomiting, diarrhea since last night. Workup to include CBC, UA, CMP,  symptomatic management with Phenergan.  Patient mildly bradycardic in the 50s. Patient states this is normal for her, and she typically has a low blood pressure and low heart rate.  Patient states Phenergan made her  nausea worse, we'll give her Zofran.  Patient's potassium returned at 3.5. Patient states her nausea is better after Zofran. We will give patient PO potassium.   Patient's urine returns with small amount of leukocytes, however patient is denying dysuria. We'll send a urine culture and followup if positive results.  Patient does well with by mouth potassium and is able to drink fluids without vomiting.  1:30 PM: Patient states her nausea is improving, however she is now experiencing some "back spasms". Patient states she experiences these frequently, and typically takes Valium at home for them. We will give patient 2.5 mg of Valium IV for back spasms.  1:55 PM: Patient states her back spasms are "much better". Patient states she is ready to go home. States her nausea feels better at this point. We will discharge her home with PRN  Zofran. We encouraged patient to follow up with her PCP to insure resolution of symptoms. Patient is agreeable to this plan. We encouraged patient to call or return to the ER should she have any questions or concerns.    Filed Vitals:   10/18/13 1430  BP: 131/72  Pulse: 52  Temp:   Resp: 18     Signed,  Ladona Mow, PA-C 4:45 PM   This patient  seen and discussed with Dr. Toy Cookey, M.D.    Monte Fantasia, PA-C 10/18/13 [phone]

## 2013-10-18 NOTE — Discharge Instructions (Signed)
Take Zofran 4 mg (one tablet) every 6 hours as needed for nausea. Followup with primary care physician.    Viral Gastroenteritis Viral gastroenteritis is also known as stomach flu. This condition affects the stomach and intestinal tract. It can cause sudden diarrhea and vomiting. The illness typically lasts 3 to 8 days. Most people develop an immune response that eventually gets rid of the virus. While this natural response develops, the virus can make you quite ill. CAUSES  Many different viruses can cause gastroenteritis, such as rotavirus or noroviruses. You can catch one of these viruses by consuming contaminated food or water. You may also catch a virus by sharing utensils or other personal items with an infected person or by touching a contaminated surface. SYMPTOMS  The most common symptoms are diarrhea and vomiting. These problems can cause a severe loss of body fluids (dehydration) and a body salt (electrolyte) imbalance. Other symptoms may include:  Fever.  Headache.  Fatigue.  Abdominal pain. DIAGNOSIS  Your caregiver can usually diagnose viral gastroenteritis based on your symptoms and a physical exam. A stool sample may also be taken to test for the presence of viruses or other infections. TREATMENT  This illness typically goes away on its own. Treatments are aimed at rehydration. The most serious cases of viral gastroenteritis involve vomiting so severely that you are not able to keep fluids down. In these cases, fluids must be given through an intravenous line (IV). HOME CARE INSTRUCTIONS   Drink enough fluids to keep your urine clear or pale yellow. Drink small amounts of fluids frequently and increase the amounts as tolerated.  Ask your caregiver for specific rehydration instructions.  Avoid:  Foods high in sugar.  Alcohol.  Carbonated drinks.  Tobacco.  Juice.  Caffeine drinks.  Extremely hot or cold fluids.  Fatty, greasy foods.  Too much intake of  anything at one time.  Dairy products until 24 to 48 hours after diarrhea stops.  You may consume probiotics. Probiotics are active cultures of beneficial bacteria. They may lessen the amount and number of diarrheal stools in adults. Probiotics can be found in yogurt with active cultures and in supplements.  Wash your hands well to avoid spreading the virus.  Only take over-the-counter or prescription medicines for pain, discomfort, or fever as directed by your caregiver. Do not give aspirin to children. Antidiarrheal medicines are not recommended.  Ask your caregiver if you should continue to take your regular prescribed and over-the-counter medicines.  Keep all follow-up appointments as directed by your caregiver. SEEK IMMEDIATE MEDICAL CARE IF:   You are unable to keep fluids down.  You do not urinate at least once every 6 to 8 hours.  You develop shortness of breath.  You notice blood in your stool or vomit. This may look like coffee grounds.  You have abdominal pain that increases or is concentrated in one small area (localized).  You have persistent vomiting or diarrhea.  You have a fever.  The patient is a child younger than 3 months, and he or she has a fever.  The patient is a child older than 3 months, and he or she has a fever and persistent symptoms.  The patient is a child older than 3 months, and he or she has a fever and symptoms suddenly get worse.  The patient is a baby, and he or she has no tears when crying. MAKE SURE YOU:   Understand these instructions.  Will watch your condition.  Will get  help right away if you are not doing well or get worse. Document Released: 02/12/2005 Document Revised: 05/07/2011 Document Reviewed: 11/29/2010 [facility] Patient Information 2015 [place], [place]. This information is not intended to replace advice given to you by your health care provider. Make sure you discuss any questions you have with your health care  provider.

## 2013-10-18 NOTE — ED Notes (Signed)
Per EMS-pt c/o of nausea, vomiting x3 days. Unable to eat or drink.  Hx GERD.

## 2013-10-18 NOTE — ED Notes (Signed)
Bed: ZO10 Expected date: 10/18/13 Expected time: 8:42 AM Means of arrival: Ambulance Comments: N/V/D for a few days

## 2013-10-18 NOTE — ED Notes (Signed)
Pt says she can not void at this time

## 2013-10-19 ENCOUNTER — Inpatient Hospital Stay (HOSPITAL_COMMUNITY)
Admission: EM | Admit: 2013-10-19 | Discharge: 2013-10-21 | DRG: 389 | Disposition: A | Discharge status: Rehabilitation Facility | Payer: Medicare Other | Attending: Internal Medicine | Admitting: Internal Medicine

## 2013-10-19 ENCOUNTER — Encounter (HOSPITAL_COMMUNITY): Payer: Self-pay | Admitting: Emergency Medicine

## 2013-10-19 ENCOUNTER — Telehealth: Payer: Self-pay | Admitting: Neurology

## 2013-10-19 ENCOUNTER — Emergency Department (HOSPITAL_COMMUNITY): Payer: Medicare Other

## 2013-10-19 DIAGNOSIS — F329 Major depressive disorder, single episode, unspecified: Secondary | ICD-10-CM

## 2013-10-19 DIAGNOSIS — R112 Nausea with vomiting, unspecified: Secondary | ICD-10-CM | POA: Insufficient documentation

## 2013-10-19 DIAGNOSIS — F1920 Other psychoactive substance dependence, uncomplicated: Secondary | ICD-10-CM | POA: Diagnosis present

## 2013-10-19 DIAGNOSIS — Z87891 Personal history of nicotine dependence: Secondary | ICD-10-CM

## 2013-10-19 DIAGNOSIS — F411 Generalized anxiety disorder: Secondary | ICD-10-CM | POA: Diagnosis present

## 2013-10-19 DIAGNOSIS — G1221 Amyotrophic lateral sclerosis: Secondary | ICD-10-CM | POA: Diagnosis present

## 2013-10-19 DIAGNOSIS — K219 Gastro-esophageal reflux disease without esophagitis: Secondary | ICD-10-CM | POA: Diagnosis present

## 2013-10-19 DIAGNOSIS — Z9181 History of falling: Secondary | ICD-10-CM

## 2013-10-19 DIAGNOSIS — F19939 Other psychoactive substance use, unspecified with withdrawal, unspecified: Secondary | ICD-10-CM | POA: Diagnosis present

## 2013-10-19 DIAGNOSIS — F1210 Cannabis abuse, uncomplicated: Secondary | ICD-10-CM | POA: Diagnosis present

## 2013-10-19 DIAGNOSIS — S52502D Unspecified fracture of the lower end of left radius, subsequent encounter for closed fracture with routine healing: Secondary | ICD-10-CM

## 2013-10-19 DIAGNOSIS — K5289 Other specified noninfective gastroenteritis and colitis: Secondary | ICD-10-CM | POA: Diagnosis present

## 2013-10-19 DIAGNOSIS — G8929 Other chronic pain: Secondary | ICD-10-CM | POA: Diagnosis present

## 2013-10-19 DIAGNOSIS — F32A Depression, unspecified: Secondary | ICD-10-CM

## 2013-10-19 DIAGNOSIS — I059 Rheumatic mitral valve disease, unspecified: Secondary | ICD-10-CM | POA: Diagnosis present

## 2013-10-19 DIAGNOSIS — Z515 Encounter for palliative care: Secondary | ICD-10-CM

## 2013-10-19 DIAGNOSIS — R1110 Vomiting, unspecified: Secondary | ICD-10-CM

## 2013-10-19 DIAGNOSIS — F3289 Other specified depressive episodes: Secondary | ICD-10-CM | POA: Diagnosis present

## 2013-10-19 DIAGNOSIS — Z881 Allergy status to other antibiotic agents status: Secondary | ICD-10-CM

## 2013-10-19 DIAGNOSIS — S52502A Unspecified fracture of the lower end of left radius, initial encounter for closed fracture: Secondary | ICD-10-CM | POA: Diagnosis present

## 2013-10-19 DIAGNOSIS — K560 Paralytic ileus: Principal | ICD-10-CM | POA: Diagnosis present

## 2013-10-19 DIAGNOSIS — Z905 Acquired absence of kidney: Secondary | ICD-10-CM

## 2013-10-19 DIAGNOSIS — M47812 Spondylosis without myelopathy or radiculopathy, cervical region: Secondary | ICD-10-CM | POA: Diagnosis present

## 2013-10-19 LAB — COMPREHENSIVE METABOLIC PANEL
ALT: 20 U/L (ref 0–35)
ANION GAP: 15 (ref 5–15)
AST: 18 U/L (ref 0–37)
Albumin: 3.9 g/dL (ref 3.5–5.2)
Alkaline Phosphatase: 61 U/L (ref 39–117)
BILIRUBIN TOTAL: 0.5 mg/dL (ref 0.3–1.2)
BUN: 20 mg/dL (ref 6–23)
CALCIUM: 9.7 mg/dL (ref 8.4–10.5)
CHLORIDE: 106 meq/L (ref 96–112)
CO2: 21 meq/L (ref 19–32)
Creatinine, Ser: 1.27 mg/dL — ABNORMAL HIGH (ref 0.50–1.10)
GFR calc non Af Amer: 48 mL/min — ABNORMAL LOW (ref >90)
GFR, EST AFRICAN AMERICAN: 55 mL/min — AB (ref >90)
GLUCOSE: 99 mg/dL (ref 70–99)
Potassium: 3.7 mEq/L (ref 3.7–5.3)
Sodium: 142 mEq/L (ref 137–147)
Total Protein: 7 g/dL (ref 6.0–8.3)

## 2013-10-19 LAB — CBC WITH DIFFERENTIAL/PLATELET
BASOS ABS: 0 10*3/uL (ref 0.0–0.1)
Basophils Relative: 0 % (ref 0–1)
Eosinophils Absolute: 0 10*3/uL (ref 0.0–0.7)
Eosinophils Relative: 0 % (ref 0–5)
HEMATOCRIT: 35.8 % — AB (ref 36.0–46.0)
Hemoglobin: 12.1 g/dL (ref 12.0–15.0)
LYMPHS PCT: 19 % (ref 12–46)
Lymphs Abs: 2 10*3/uL (ref 0.7–4.0)
MCH: 30.9 pg (ref 26.0–34.0)
MCHC: 33.8 g/dL (ref 30.0–36.0)
MCV: 91.3 fL (ref 78.0–100.0)
MONO ABS: 0.6 10*3/uL (ref 0.1–1.0)
MONOS PCT: 6 % (ref 3–12)
NEUTROS ABS: 8 10*3/uL — AB (ref 1.7–7.7)
Neutrophils Relative %: 75 % (ref 43–77)
Platelets: 209 10*3/uL (ref 150–400)
RBC: 3.92 MIL/uL (ref 3.87–5.11)
RDW: 12.3 % (ref 11.5–15.5)
WBC: 10.7 10*3/uL — AB (ref 4.0–10.5)

## 2013-10-19 LAB — URINALYSIS, ROUTINE W REFLEX MICROSCOPIC
BILIRUBIN URINE: NEGATIVE
GLUCOSE, UA: NEGATIVE mg/dL
HGB URINE DIPSTICK: NEGATIVE
KETONES UR: 15 mg/dL — AB
Nitrite: NEGATIVE
PROTEIN: NEGATIVE mg/dL
Specific Gravity, Urine: 1.023 (ref 1.005–1.030)
UROBILINOGEN UA: 0.2 mg/dL (ref 0.0–1.0)
pH: 6 (ref 5.0–8.0)

## 2013-10-19 LAB — LIPASE, BLOOD: LIPASE: 67 U/L — AB (ref 11–59)

## 2013-10-19 LAB — URINE MICROSCOPIC-ADD ON

## 2013-10-19 LAB — URINE CULTURE: Special Requests: NORMAL

## 2013-10-19 LAB — I-STAT TROPONIN, ED: TROPONIN I, POC: 0 ng/mL (ref 0.00–0.08)

## 2013-10-19 MED ORDER — FLUOXETINE HCL 20 MG PO CAPS
20.0000 mg | ORAL_CAPSULE | Freq: Every day | ORAL | Status: DC
  Administered 2013-10-20 – 2013-10-21 (×2): 20 mg via ORAL
  Filled 2013-10-19 (×2): qty 1

## 2013-10-19 MED ORDER — DIAZEPAM 5 MG PO TABS
5.0000 mg | ORAL_TABLET | Freq: Two times a day (BID) | ORAL | Status: DC | PRN
  Administered 2013-10-20 – 2013-10-21 (×3): 5 mg via ORAL
  Filled 2013-10-19 (×3): qty 1

## 2013-10-19 MED ORDER — PREGABALIN 75 MG PO CAPS
150.0000 mg | ORAL_CAPSULE | Freq: Three times a day (TID) | ORAL | Status: DC
  Administered 2013-10-20 – 2013-10-21 (×6): 150 mg via ORAL
  Filled 2013-10-19 (×5): qty 2

## 2013-10-19 MED ORDER — LORAZEPAM 1 MG PO TABS
2.0000 mg | ORAL_TABLET | Freq: Once | ORAL | Status: AC
  Administered 2013-10-19: 2 mg via ORAL
  Filled 2013-10-19: qty 2

## 2013-10-19 MED ORDER — LORAZEPAM 1 MG PO TABS
2.0000 mg | ORAL_TABLET | Freq: Three times a day (TID) | ORAL | Status: DC | PRN
  Administered 2013-10-20 – 2013-10-21 (×3): 2 mg via ORAL
  Filled 2013-10-19 (×3): qty 2

## 2013-10-19 MED ORDER — PROMETHAZINE HCL 25 MG/ML IJ SOLN
12.5000 mg | Freq: Once | INTRAMUSCULAR | Status: AC
  Administered 2013-10-19: 12.5 mg via INTRAVENOUS
  Filled 2013-10-19: qty 1

## 2013-10-19 MED ORDER — IOHEXOL 300 MG/ML  SOLN
25.0000 mL | Freq: Once | INTRAMUSCULAR | Status: AC | PRN
  Administered 2013-10-19: 25 mL via ORAL

## 2013-10-19 MED ORDER — SODIUM CHLORIDE 0.9 % IV SOLN
INTRAVENOUS | Status: DC
  Administered 2013-10-20 (×2): via INTRAVENOUS

## 2013-10-19 MED ORDER — DICYCLOMINE HCL 10 MG/ML IM SOLN
20.0000 mg | Freq: Once | INTRAMUSCULAR | Status: AC
  Administered 2013-10-19: 20 mg via INTRAMUSCULAR
  Filled 2013-10-19: qty 2

## 2013-10-19 MED ORDER — PANTOPRAZOLE SODIUM 40 MG PO TBEC
40.0000 mg | DELAYED_RELEASE_TABLET | Freq: Two times a day (BID) | ORAL | Status: DC
  Administered 2013-10-20 – 2013-10-21 (×4): 40 mg via ORAL
  Filled 2013-10-19 (×4): qty 1

## 2013-10-19 MED ORDER — ONDANSETRON HCL 4 MG PO TABS: 4.0000 mg | ORAL_TABLET | Freq: Four times a day (QID) | ORAL | Status: DC | PRN

## 2013-10-19 MED ORDER — HYDROMORPHONE HCL PF 1 MG/ML IJ SOLN
1.0000 mg | Freq: Once | INTRAMUSCULAR | Status: AC
  Administered 2013-10-19: 1 mg via INTRAVENOUS
  Filled 2013-10-19: qty 1

## 2013-10-19 MED ORDER — ONDANSETRON HCL 4 MG/2ML IJ SOLN
4.0000 mg | Freq: Once | INTRAMUSCULAR | Status: AC
Start: 2013-10-19 — End: 2013-10-19
  Administered 2013-10-19: 4 mg via INTRAVENOUS
  Filled 2013-10-19: qty 2

## 2013-10-19 MED ORDER — DEXAMETHASONE 2 MG PO TABS
2.0000 mg | ORAL_TABLET | Freq: Every day | ORAL | Status: DC
  Administered 2013-10-20 – 2013-10-21 (×2): 2 mg via ORAL
  Filled 2013-10-19 (×2): qty 1

## 2013-10-19 MED ORDER — ACETAMINOPHEN 650 MG RE SUPP: 650.0000 mg | Freq: Four times a day (QID) | RECTAL | Status: DC | PRN

## 2013-10-19 MED ORDER — SODIUM CHLORIDE 0.9 % IV BOLUS (SEPSIS)
1000.0000 mL | Freq: Once | INTRAVENOUS | Status: AC
  Administered 2013-10-19: 1000 mL via INTRAVENOUS

## 2013-10-19 MED ORDER — ACETAMINOPHEN 325 MG PO TABS
650.0000 mg | ORAL_TABLET | Freq: Four times a day (QID) | ORAL | Status: DC | PRN
Start: 2013-10-19 — End: 2013-10-21

## 2013-10-19 MED ORDER — GI COCKTAIL ~~LOC~~
30.0000 mL | Freq: Once | ORAL | Status: AC
  Administered 2013-10-19: 30 mL via ORAL
  Filled 2013-10-19: qty 30

## 2013-10-19 MED ORDER — ARIPIPRAZOLE 2 MG PO TABS
2.0000 mg | ORAL_TABLET | Freq: Every day | ORAL | Status: DC
  Administered 2013-10-20 – 2013-10-21 (×2): 2 mg via ORAL
  Filled 2013-10-19 (×2): qty 1

## 2013-10-19 MED ORDER — HYDROMORPHONE HCL PF 1 MG/ML IJ SOLN
0.5000 mg | INTRAMUSCULAR | Status: DC | PRN
  Administered 2013-10-20: 0.5 mg via INTRAVENOUS
  Filled 2013-10-19: qty 1

## 2013-10-19 MED ORDER — ONDANSETRON HCL 4 MG/2ML IJ SOLN
4.0000 mg | Freq: Once | INTRAMUSCULAR | Status: AC
  Administered 2013-10-19: 4 mg via INTRAVENOUS
  Filled 2013-10-19: qty 2

## 2013-10-19 MED ORDER — MORPHINE SULFATE 4 MG/ML IJ SOLN
4.0000 mg | Freq: Once | INTRAMUSCULAR | Status: AC
  Administered 2013-10-19: 4 mg via INTRAVENOUS
  Filled 2013-10-19: qty 1

## 2013-10-19 MED ORDER — ONDANSETRON HCL 4 MG/2ML IJ SOLN
4.0000 mg | Freq: Four times a day (QID) | INTRAMUSCULAR | Status: DC | PRN
  Administered 2013-10-20 – 2013-10-21 (×2): 4 mg via INTRAVENOUS
  Filled 2013-10-19 (×2): qty 2

## 2013-10-19 MED ORDER — OXYCODONE HCL 5 MG PO TABS
5.0000 mg | ORAL_TABLET | ORAL | Status: DC | PRN
  Administered 2013-10-20 (×3): 5 mg via ORAL
  Filled 2013-10-19 (×3): qty 1

## 2013-10-19 MED ORDER — PROMETHAZINE HCL 25 MG/ML IJ SOLN: 25.0000 mg | Freq: Once | INTRAMUSCULAR | Status: DC

## 2013-10-19 MED ORDER — ENOXAPARIN SODIUM 40 MG/0.4ML ~~LOC~~ SOLN
40.0000 mg | SUBCUTANEOUS | Status: DC
  Administered 2013-10-20 – 2013-10-21 (×2): 40 mg via SUBCUTANEOUS
  Filled 2013-10-19 (×2): qty 0.4

## 2013-10-19 MED ORDER — METHADONE HCL 5 MG PO TABS
5.0000 mg | ORAL_TABLET | Freq: Three times a day (TID) | ORAL | Status: DC | PRN
  Administered 2013-10-21 (×2): 5 mg via ORAL
  Filled 2013-10-19 (×2): qty 1

## 2013-10-19 MED ORDER — FAMOTIDINE IN NACL 20-0.9 MG/50ML-% IV SOLN
20.0000 mg | Freq: Once | INTRAVENOUS | Status: AC
  Administered 2013-10-19: 20 mg via INTRAVENOUS
  Filled 2013-10-19: qty 50

## 2013-10-19 MED ORDER — PROMETHAZINE HCL 25 MG PO TABS
25.0000 mg | ORAL_TABLET | Freq: Four times a day (QID) | ORAL | Status: DC | PRN
  Administered 2013-10-20: 25 mg via ORAL
  Filled 2013-10-19: qty 1

## 2013-10-19 NOTE — Telephone Encounter (Signed)
Spoke to patient's son and the patient has been vomiting and diarrhea for 4 days.  He brought her to the hospital but they gave her zofran and sent her home.  The son is asking if Dr. Anne Hahn can get her admitted for at least overnight to treat this issue.  She has not eaten anything in a few days, and continues to get weaker and weaker.  She can not even stand now and he has to lift and carry her place to place.  I asked him about receiving hospice care and he relayed they came about 4-5 weeks ago to do an evaluation but said she wasn't ready.  He states she gets worse and worse each week.  Also he doesn't have any home health coming out.  The patient's son sounds very concerned about not getting any help and also feels she needs an overnight stay in the hospital.  He can be reached at [phone].

## 2013-10-19 NOTE — ED Notes (Signed)
Family at bedside. 

## 2013-10-19 NOTE — Telephone Encounter (Signed)
Patient's son called back stating his mother is on the way to the hospital by EMS.

## 2013-10-19 NOTE — Telephone Encounter (Signed)
I called left a message. The patient went to the emergency room today for nausea and vomiting, decreased oral intake. If there is anything that I can do to help them, I will be happy to do it, they are to contact our office.

## 2013-10-19 NOTE — ED Notes (Addendum)
Ct notified that pt is 3/4 completed with PO contrast & that PA advises scan to be completed without completing oral contrast

## 2013-10-19 NOTE — H&P (Signed)
[facility] History and Physical  Patient: Katherine Potts  ZOX:[id number]  DOB: 1960/12/16  DOS: the patient was seen and examined on 10/19/2013 PCP: Gretel Acre, MD  Chief Complaint: Abdominal pain and nausea vomiting  HPI: [name] is a 53 y.o. female with Past medical history of GERD, anxiety, ALS, chronic pain. The patient presents with complaints of abdominal pain that has been ongoing since last 4 days associated with nausea and vomiting. The patient has not been able to get her medications at home due to her persistent nausea and vomiting. The ED he also reports that the patient had episodes of diarrhea that has been ongoing since the same time. Due to the diarrhea are normal without any blood in the vomiting is also normal without any blood. Patient continues to have diffuse abdominal pain at the time of my evaluation along with nausea and dry heaving he did she complains of generalized fatigue and tiredness with inability to move her hands and activities. There is also reported that the patient is not able to mobilize on her own which she was able to do that prior to this episode. No other recent change in her medications. Patient has history of chronic ileus and she has significant decline over last few months with her mobility. Was evaluated by hospice few months ago, and was not qualifying for the same.  The patient is coming from home. And at her baseline independent for most of her ADL.  Review of Systems: as mentioned in the history of present illness.  A Comprehensive review of the other systems is negative.  Past Medical History  Diagnosis Date  . GERD (gastroesophageal reflux disease)   . Anxiety   . Depression   . MVP (mitral valve prolapse)   . Cervical spondylosis   . 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 Pneumonia   . Constipation    Past Surgical History  Procedure Laterality Date  . Eye surgery    . Breast  surgery    . Vagina surgery      mesh  . Kidney donation      Right nephrectomy  . Abdominal hernia repair    . Orif wrist fracture  07/09/2013    DR Melvyn Novas  . Orif wrist fracture Left 07/09/2013    Procedure: OPEN REDUCTION INTERNAL FIXATION (ORIF) LEFT  WRIST FRACTURE;  Surgeon: Sharma Covert, MD;  Location: [facility] OR;  Service: Orthopedics;  Laterality: Left;   Social History:  reports that she has quit smoking. Her smoking use included Cigarettes and Cigars. She smoked 0.40 packs per day. She has never used smokeless tobacco. She reports that she uses illicit drugs (Marijuana). She reports that she does not drink alcohol.  Allergies  Allergen Reactions  . Bactrim [Sulfamethoxazole-Trimethoprim] Nausea Only  . Ciprofloxacin Nausea And Vomiting    Family History  Problem Relation Age of Onset  . Bipolar disorder Brother   . Dementia Mother   . Hypertension Father   . ALS Cousin     Prior to Admission medications   Medication Sig Start Date End Date Taking? Authorizing Provider  ARIPiprazole (ABILIFY) 2 MG tablet Take 2 mg by mouth daily. 09/28/13  Yes Historical Provider, MD  calcium carbonate (TUMS - DOSED IN MG ELEMENTAL CALCIUM) 500 MG chewable tablet Chew 1 tablet by mouth daily.   Yes Historical Provider, MD  dexamethasone (DECADRON) 2 MG tablet Take 2 mg by mouth daily. 10/09/13  Yes Historical  Provider, MD  diazepam (VALIUM) 5 MG tablet Take 1 tablet (5 mg total) by mouth every 12 (twelve) hours as needed for muscle spasms. 07/22/13  Yes York Spaniel, MD  esomeprazole (NEXIUM) 40 MG capsule Take 40 mg by mouth daily before breakfast.   Yes Historical Provider, MD  FLUoxetine (PROZAC) 20 MG capsule Take 20 mg by mouth daily. 09/29/13  Yes Historical Provider, MD  LORazepam (ATIVAN) 1 MG tablet Take 2 mg by mouth 3 (three) times daily as needed for anxiety.   Yes Historical Provider, MD  LYRICA 150 MG capsule Take 150 mg by mouth 3 (three) times daily. 10/09/13  Yes Historical  Provider, MD  methadone (DOLOPHINE) 5 MG tablet Take 5 mg by mouth every 8 (eight) hours as needed for severe pain.  10/14/13  Yes Historical Provider, MD  ondansetron (ZOFRAN) 4 MG tablet Take 1 tablet (4 mg total) by mouth every 6 (six) hours. 10/18/13  Yes Monte Fantasia, PA-C  promethazine (PHENERGAN) 25 MG tablet Take 25 mg by mouth every 6 (six) hours as needed for nausea or vomiting.   Yes Historical Provider, MD  vitamin C (ASCORBIC ACID) 500 MG tablet Take 1 tablet (500 mg total) by mouth daily. 07/09/13  Yes Sharma Covert, MD    Physical Exam: Filed Vitals:   10/19/13 2245 10/19/13 2300 10/19/13 2315 10/19/13 2330  BP: 120/73 127/74 125/70 124/72  Pulse: 54 53 51 52  Temp:      TempSrc:      Resp: Height:      Weight:      SpO2: 97% 90% 93% 95%    General: Alert, Awake and Oriented to Time, Place and Person. Appear in mild distress Eyes: PERRL ENT: Oral Mucosa clear dry. Neck: no JVD Cardiovascular: S1 and S2 Present, no Murmur, Peripheral Pulses Present Respiratory: Bilateral Air entry equal and Decreased, Clear to Auscultation, noCrackles, no wheezes Abdomen: Bowel Sound Present, Soft and mild diffuse tender Skin: no Rash Extremities: Trace Pedal edema, no calf tenderness  left hand brace present Neurologic: Grossly no focal neuro deficit other than generalized weakness, left hand examination is limited due to presence of brace  Labs on Admission:  CBC:  Recent Labs Lab 10/18/13 0954 10/19/13 1248  WBC 7.9 10.7*  NEUTROABS 6.0 8.0*  HGB 12.7 12.1  HCT 37.5 35.8*  MCV 94.0 91.3  PLT 191 209    CMP     Component Value Date/Time   NA 142 10/19/2013 1248   NA 141 06/24/2013 1240   K 3.7 10/19/2013 1248   CL 106 10/19/2013 1248   CO2 21 10/19/2013 1248   GLUCOSE 99 10/19/2013 1248   GLUCOSE 91 06/24/2013 1240   BUN 20 10/19/2013 1248   BUN 15 06/24/2013 1240   CREATININE 1.27* 10/19/2013 1248   CALCIUM 9.7 10/19/2013 1248   PROT 7.0 10/19/2013 1248    PROT 6.5 06/24/2013 1240   ALBUMIN 3.9 10/19/2013 1248   AST 18 10/19/2013 1248   ALT 20 10/19/2013 1248   ALKPHOS 61 10/19/2013 1248   BILITOT 0.5 10/19/2013 1248   GFRNONAA 48* 10/19/2013 1248   GFRAA 55* 10/19/2013 1248     Recent Labs Lab 10/19/13 1248  LIPASE 67*   No results found for this basename: AMMONIA,  in the last 168 hours  No results found for this basename: CKTOTAL, CKMB, CKMBINDEX, TROPONINI,  in the last 168 hours BNP (last 3 results) No results found for  this basename: PROBNP,  in the last 8760 hours  Radiological Exams on Admission: Ct Abdomen Pelvis Wo Contrast  10/19/2013   CLINICAL DATA:  Left-sided abdominal pain for 4 days  EXAM: CT ABDOMEN AND PELVIS WITHOUT CONTRAST  TECHNIQUE: Multidetector CT imaging of the abdomen and pelvis was performed following the standard protocol without IV contrast.  COMPARISON:  None.  FINDINGS: Lung bases clear except for 6 mm calcified nodule left lower lobe likely a benign granuloma. Bilateral breast implants noted.  Liver, gallbladder, spleen, pancreas, and adrenal glands normal. Right kidney not identified. Left kidney normal. Probable small splenule left upper quadrant.  Mild calcification of the aorta. Stomach and small bowel normal. Appendix normal. Mild diverticulosis distal descending colon, with large bowel otherwise negative.  Bladder and reproductive organs are normal. No acute musculoskeletal findings. No adenopathy or ascites.  IMPRESSION: No acute abnormalities.   Electronically Signed   By: Esperanza Heir M.D.   On: 10/19/2013 17:59    Assessment/Plan Principal Problem:   Intractable nausea and vomiting Active Problems:   GERD (gastroesophageal reflux disease)   Depression   ALS (amyotrophic lateral sclerosis)   Closed fracture of left distal radius   1. Intractable nausea and vomiting The patient is presenting with complaints of nausea and vomiting along with diarrhea and abdominal pain. Probable etiology more  likely is gastroenteritis. She does not have any recent hospitalization recent antibiotic use. CT of the abdomen was also negative for any acute abnormality. The patient is unable to take any medication due to persistent nausea and vomiting. With this the patient will be admitted and observed her for observation. We will continue her on IV hydration. IV Zofran will be provided. IV Protonix. GI pathogen panel will be done. Continue to monitor.  2. ALS. The patient has been having progressive decline over last few months. With rapid decline over last few days. At present I would continue to monitor her and she may require a neurologic consultation and there is no improvement she may also require palliative care consultation and there is no improvement. Would try to continue her home pain medication regimen. With as needed IV pain medications  DVT Prophylaxis: subcutaneous Heparin Nutrition: N.p.o. for bowel rest  Code Status: Full presumed  Disposition: Admitted to observation in med-surge unit.  Author: Lynden Oxford, MD [facility] Pager: [phone] 10/19/2013, 11:48 PM    If 7PM-7AM, please contact night-coverage [email or web address] Password [email or web address]  **Disclaimer: This note may have been dictated with voice recognition software. Similar sounding words can inadvertently be transcribed and this note may contain transcription errors which may not have been corrected upon publication of note.**

## 2013-10-19 NOTE — ED Provider Notes (Signed)
CSN: [id number]     Arrival date & time 10/19/13  1232 History   First MD Initiated Contact with Patient 10/19/13 1238     Chief Complaint  Patient presents with  . Abdominal Pain  . Emesis     (Consider location/radiation/quality/duration/timing/severity/associated sxs/prior Treatment) HPI Comments: Pt is a 53 y/o female with a PMHx of GERD, anxiety, depression, MVP, ALS and cervical spondylosis who presents to the emergency department via EMS from home complaining of left upper abdominal pain radiating towards her mid epigastric area x4 days. She was seen at [facility] emergency department yesterday, given IV fluids, Phenergan and Zofran with temporary relief of her symptoms. She had labs at that time without any acute findings. States she is still having diarrhea which has been present for 4 days now. Diarrhea is normal stool color, denies any mucus or blood, about 4 times per day. States initially she was vomiting, however is now constantly nauseated and dry heaving. States she is able to keep fluids down, however cannot keep oral zofran down. Denies increased urinary frequency, urgency, dysuria, fever or chills, chest pain or shortness of breath. Denies recent antibiotic use.  Patient is a 53 y.o. female presenting with abdominal pain and vomiting. The history is provided by the patient.  Abdominal Pain Associated symptoms: diarrhea, nausea and vomiting   Emesis Associated symptoms: abdominal pain and diarrhea     Past Medical History  Diagnosis Date  . GERD (gastroesophageal reflux disease)   . Anxiety   . Depression   . MVP (mitral valve prolapse)   . Cervical spondylosis   . 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 Pneumonia   . Constipation    Past Surgical History  Procedure Laterality Date  . Eye surgery    . Breast surgery    . Vagina surgery      mesh  . Kidney donation      Right nephrectomy  . Abdominal hernia repair    . Orif  wrist fracture  07/09/2013    DR Melvyn Novas  . Orif wrist fracture Left 07/09/2013    Procedure: OPEN REDUCTION INTERNAL FIXATION (ORIF) LEFT  WRIST FRACTURE;  Surgeon: Sharma Covert, MD;  Location: [facility] OR;  Service: Orthopedics;  Laterality: Left;   Family History  Problem Relation Age of Onset  . Bipolar disorder Brother   . Dementia Mother   . Hypertension Father   . ALS Cousin    History  Substance Use Topics  . Smoking status: Former Smoker -- 0.40 packs/day    Types: Cigarettes, Cigars  . Smokeless tobacco: Never Used  . Alcohol Use: No     Comment: none   OB History   Grav Para Term Preterm Abortions TAB SAB Ect Mult Living                 Review of Systems  Gastrointestinal: Positive for nausea, vomiting, abdominal pain and diarrhea.  All other systems reviewed and are negative.     Allergies  Bactrim and Ciprofloxacin  Home Medications   Prior to Admission medications   Medication Sig Start Date End Date Taking? Authorizing Provider  ARIPiprazole (ABILIFY) 2 MG tablet Take 2 mg by mouth daily. 09/28/13  Yes Historical Provider, MD  calcium carbonate (TUMS - DOSED IN MG ELEMENTAL CALCIUM) 500 MG chewable tablet Chew 1 tablet by mouth daily.   Yes Historical Provider, MD  dexamethasone (DECADRON) 2 MG tablet Take 2 mg by mouth daily.  10/09/13  Yes Historical Provider, MD  diazepam (VALIUM) 5 MG tablet Take 1 tablet (5 mg total) by mouth every 12 (twelve) hours as needed for muscle spasms. 07/22/13  Yes York Spaniel, MD  esomeprazole (NEXIUM) 40 MG capsule Take 40 mg by mouth daily before breakfast.   Yes Historical Provider, MD  FLUoxetine (PROZAC) 20 MG capsule Take 20 mg by mouth daily. 09/29/13  Yes Historical Provider, MD  LORazepam (ATIVAN) 1 MG tablet Take 2 mg by mouth 3 (three) times daily as needed for anxiety.   Yes Historical Provider, MD  LYRICA 150 MG capsule Take 150 mg by mouth 3 (three) times daily. 10/09/13  Yes Historical Provider, MD  methadone  (DOLOPHINE) 5 MG tablet Take 5 mg by mouth every 8 (eight) hours as needed for severe pain.  10/14/13  Yes Historical Provider, MD  ondansetron (ZOFRAN) 4 MG tablet Take 1 tablet (4 mg total) by mouth every 6 (six) hours. 10/18/13  Yes Monte Fantasia, PA-C  promethazine (PHENERGAN) 25 MG tablet Take 25 mg by mouth every 6 (six) hours as needed for nausea or vomiting.   Yes Historical Provider, MD  vitamin C (ASCORBIC ACID) 500 MG tablet Take 1 tablet (500 mg total) by mouth daily. 07/09/13  Yes Sharma Covert, MD   BP 125/76  Pulse 46  Temp(Src) 98.3 F (36.8 C) (Oral)  Resp 15  Ht  (1.626 m)  Wt 145 lb (65.772 kg)  BMI 24.88 kg/m2  SpO2 100% Physical Exam  Nursing note and vitals reviewed. Constitutional: She is oriented to person, place, and time. She appears well-developed and well-nourished. No distress.  HENT:  Head: Normocephalic and atraumatic.  Dry MM.  Eyes: Conjunctivae are normal.  Neck: Normal range of motion. Neck supple.  Cardiovascular: Normal rate, regular rhythm and normal heart sounds.   Pulmonary/Chest: Effort normal and breath sounds normal.  Abdominal: Soft. Normal appearance and bowel sounds are normal. She exhibits no distension. There is tenderness.  Generalized abdominal tenderness, worse right lower quadrant, left lower quadrant and left upper quadrant with guarding. No peritoneal signs.  Musculoskeletal: Normal range of motion. She exhibits no edema.  Neurological: She is alert and oriented to person, place, and time.  Skin: Skin is warm and dry. She is not diaphoretic.  Psychiatric: She has a normal mood and affect. Her behavior is normal.    ED Course  Procedures (including critical care time) Labs Review Labs Reviewed  CBC WITH DIFFERENTIAL - Abnormal; Notable for the following:    WBC 10.7 (*)    HCT 35.8 (*)    Neutro Abs 8.0 (*)    All other components within normal limits  COMPREHENSIVE METABOLIC PANEL - Abnormal; Notable for the following:     Creatinine, Ser 1.27 (*)    GFR calc non Af Amer 48 (*)    GFR calc Af Amer 55 (*)    All other components within normal limits  LIPASE, BLOOD - Abnormal; Notable for the following:    Lipase 67 (*)    All other components within normal limits  URINALYSIS, ROUTINE W REFLEX MICROSCOPIC - Abnormal; Notable for the following:    APPearance CLOUDY (*)    Ketones, ur 15 (*)    Leukocytes, UA SMALL (*)    All other components within normal limits  URINE MICROSCOPIC-ADD ON - Abnormal; Notable for the following:    Squamous Epithelial / LPF FEW (*)    Bacteria, UA FEW (*)  All other components within normal limits  GI PATHOGEN PANEL BY PCR, STOOL  I-STAT TROPOININ, ED    Imaging Review No results found.   EKG Interpretation   Date/Time:  Monday October 19 2013 12:50:53 EDT Ventricular Rate:  47 PR Interval:  153 QRS Duration: 83 QT Interval:  510 QTC Calculation: 451 R Axis:   71 Text Interpretation:  Bradycardia with irregular rate Borderline  repolarization abnormality Confirmed by Bebe Shaggy  MD, DONALD ([id number]) on  10/19/2013 12:59:42 PM      MDM   Final diagnoses:  None   Pt presenting with abdominal pain, n/v/d. She is non-toxic appearing and in NAD. AFVSS. HR in 50s, normal per pt. She had labs done at [facility] yesterday without any acute findings. Abdomen with generalized tenderness, worse with guarding RLQ, LLQ, LUQ. No peritoneal signs. Plan to repeat labs, stool cultures, obtain CT abd/pelvis, give IV fluids and anti-emetics.  Labs showing mild leukocytosis, slightly elevated lipase. Pt drinking contrast. CT pending. Pt signed out to Dr. Celene Kras and Dr. Preston Fleeting at shift change.  Case discussed with attending Dr. Bebe Shaggy who agrees with plan of care.   Trevor Mace, PA-C 10/19/13 1542

## 2013-10-19 NOTE — ED Notes (Signed)
Per [facility], pt from home after having abd pain toward the left side for four days now. Was seen at [facility] yesterday and given IVF and IV zofran. Given Rx for PO zofran not ODT and hasn't been able to keep anything down. Has ALS and is wheelchair bound.

## 2013-10-19 NOTE — ED Provider Notes (Signed)
Pt care received from [facility], [place].  Please refer to her note for complete history and physical. In brief Katherine Potts is a 53 y.o. female with the listed past medical history presenting for abdominal pain, nausea, vomiting, and diarrhea.  Past Medical History  Diagnosis Date  . GERD (gastroesophageal reflux disease)   . Anxiety   . Depression   . MVP (mitral valve prolapse)   . Cervical spondylosis   . 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 Pneumonia   . Constipation     Labs Reviewed  CBC WITH DIFFERENTIAL - Abnormal; Notable for the following:    WBC 10.7 (*)    HCT 35.8 (*)    Neutro Abs 8.0 (*)    All other components within normal limits  COMPREHENSIVE METABOLIC PANEL - Abnormal; Notable for the following:    Creatinine, Ser 1.27 (*)    GFR calc non Af Amer 48 (*)    GFR calc Af Amer 55 (*)    All other components within normal limits  LIPASE, BLOOD - Abnormal; Notable for the following:    Lipase 67 (*)    All other components within normal limits  URINALYSIS, ROUTINE W REFLEX MICROSCOPIC - Abnormal; Notable for the following:    APPearance CLOUDY (*)    Ketones, ur 15 (*)    Leukocytes, UA SMALL (*)    All other components within normal limits  URINE MICROSCOPIC-ADD ON - Abnormal; Notable for the following:    Squamous Epithelial / LPF FEW (*)    Bacteria, UA FEW (*)    All other components within normal limits  GI PATHOGEN PANEL BY PCR, STOOL  COMPREHENSIVE METABOLIC PANEL  CBC  PROTIME-INR  I-STAT TROPOININ, ED   CT Abdomen Pelvis Wo Contrast (Final result)  Result time: 10/19/13 17:59:54    Final result by Rad Results In Interface (10/19/13 17:59:54)    Narrative:   CLINICAL DATA: Left-sided abdominal pain for 4 days  EXAM: CT ABDOMEN AND PELVIS WITHOUT CONTRAST  TECHNIQUE: Multidetector CT imaging of the abdomen and pelvis was performed following the standard protocol without IV contrast.  COMPARISON:  None.  FINDINGS: Lung bases clear except for 6 mm calcified nodule left lower lobe likely a benign granuloma. Bilateral breast implants noted.  Liver, gallbladder, spleen, pancreas, and adrenal glands normal. Right kidney not identified. Left kidney normal. Probable small splenule left upper quadrant.  Mild calcification of the aorta. Stomach and small bowel normal. Appendix normal. Mild diverticulosis distal descending colon, with large bowel otherwise negative.  Bladder and reproductive organs are normal. No acute musculoskeletal findings. No adenopathy or ascites.  IMPRESSION: No acute abnormalities.     Workup studies including imaging, negative for acute processes, however patient does have mild elevation of lipase consistent with recurrent vomiting.  Per her caregiver, patient has not felt well enough to take her oral narcotics at home for the past few days. Patient has had increased nausea, and this had worsening of her abdominal pain while off of her chronic narcotics. Patient previously was evaluated for hospice, but did not qualify, however has been seen by palliative care.   Patient's symptoms likely result of narcotic withdrawal due to her significant chronic pain related to her ALS and her other medical conditions. Discussed patient with hospitalist service for observation, and further treatment for intractable nausea vomiting and abdominal pain.  Clinical Impression: 1. ALS (amyotrophic lateral sclerosis)   2. Encounter for palliative care  3. Intractable vomiting with nausea, vomiting of unspecified type      Patient care was discussed with my attending, Dr. Preston Fleeting.      Gavin Pound, MD 10/20/13 [phone]

## 2013-10-20 ENCOUNTER — Observation Stay (HOSPITAL_COMMUNITY): Payer: Medicare Other

## 2013-10-20 DIAGNOSIS — Z87891 Personal history of nicotine dependence: Secondary | ICD-10-CM | POA: Diagnosis not present

## 2013-10-20 DIAGNOSIS — K219 Gastro-esophageal reflux disease without esophagitis: Secondary | ICD-10-CM | POA: Diagnosis present

## 2013-10-20 DIAGNOSIS — K560 Paralytic ileus: Secondary | ICD-10-CM | POA: Diagnosis present

## 2013-10-20 DIAGNOSIS — Z881 Allergy status to other antibiotic agents status: Secondary | ICD-10-CM | POA: Diagnosis not present

## 2013-10-20 DIAGNOSIS — K5289 Other specified noninfective gastroenteritis and colitis: Secondary | ICD-10-CM | POA: Diagnosis present

## 2013-10-20 DIAGNOSIS — F19939 Other psychoactive substance use, unspecified with withdrawal, unspecified: Secondary | ICD-10-CM | POA: Diagnosis present

## 2013-10-20 DIAGNOSIS — F411 Generalized anxiety disorder: Secondary | ICD-10-CM | POA: Diagnosis present

## 2013-10-20 DIAGNOSIS — F329 Major depressive disorder, single episode, unspecified: Secondary | ICD-10-CM | POA: Diagnosis present

## 2013-10-20 DIAGNOSIS — R1013 Epigastric pain: Secondary | ICD-10-CM | POA: Diagnosis present

## 2013-10-20 DIAGNOSIS — G1221 Amyotrophic lateral sclerosis: Secondary | ICD-10-CM | POA: Diagnosis present

## 2013-10-20 DIAGNOSIS — F1210 Cannabis abuse, uncomplicated: Secondary | ICD-10-CM | POA: Diagnosis present

## 2013-10-20 DIAGNOSIS — F1920 Other psychoactive substance dependence, uncomplicated: Secondary | ICD-10-CM | POA: Diagnosis present

## 2013-10-20 DIAGNOSIS — M47812 Spondylosis without myelopathy or radiculopathy, cervical region: Secondary | ICD-10-CM | POA: Diagnosis present

## 2013-10-20 DIAGNOSIS — I059 Rheumatic mitral valve disease, unspecified: Secondary | ICD-10-CM | POA: Diagnosis present

## 2013-10-20 DIAGNOSIS — Z905 Acquired absence of kidney: Secondary | ICD-10-CM | POA: Diagnosis not present

## 2013-10-20 DIAGNOSIS — G8929 Other chronic pain: Secondary | ICD-10-CM | POA: Diagnosis present

## 2013-10-20 DIAGNOSIS — Z9181 History of falling: Secondary | ICD-10-CM | POA: Diagnosis not present

## 2013-10-20 DIAGNOSIS — F3289 Other specified depressive episodes: Secondary | ICD-10-CM | POA: Diagnosis present

## 2013-10-20 LAB — COMPREHENSIVE METABOLIC PANEL
ALK PHOS: 52 U/L (ref 39–117)
ALT: 14 U/L (ref 0–35)
AST: 15 U/L (ref 0–37)
Albumin: 3.1 g/dL — ABNORMAL LOW (ref 3.5–5.2)
Anion gap: 14 (ref 5–15)
BILIRUBIN TOTAL: 0.4 mg/dL (ref 0.3–1.2)
BUN: 18 mg/dL (ref 6–23)
CHLORIDE: 109 meq/L (ref 96–112)
CO2: 19 mEq/L (ref 19–32)
Calcium: 8.1 mg/dL — ABNORMAL LOW (ref 8.4–10.5)
Creatinine, Ser: 1.15 mg/dL — ABNORMAL HIGH (ref 0.50–1.10)
GFR calc Af Amer: 62 mL/min — ABNORMAL LOW (ref >90)
GFR calc non Af Amer: 54 mL/min — ABNORMAL LOW (ref >90)
Glucose, Bld: 71 mg/dL (ref 70–99)
POTASSIUM: 3.1 meq/L — AB (ref 3.7–5.3)
SODIUM: 142 meq/L (ref 137–147)
Total Protein: 5.5 g/dL — ABNORMAL LOW (ref 6.0–8.3)

## 2013-10-20 LAB — PROTIME-INR
INR: 1.18 (ref 0.00–1.49)
Prothrombin Time: 15 seconds (ref 11.6–15.2)

## 2013-10-20 LAB — MAGNESIUM: MAGNESIUM: 1.4 mg/dL — AB (ref 1.5–2.5)

## 2013-10-20 LAB — CBC
HCT: 31.7 % — ABNORMAL LOW (ref 36.0–46.0)
Hemoglobin: 10.9 g/dL — ABNORMAL LOW (ref 12.0–15.0)
MCH: 32.4 pg (ref 26.0–34.0)
MCHC: 34.4 g/dL (ref 30.0–36.0)
MCV: 94.3 fL (ref 78.0–100.0)
Platelets: 157 10*3/uL (ref 150–400)
RBC: 3.36 MIL/uL — ABNORMAL LOW (ref 3.87–5.11)
RDW: 12.4 % (ref 11.5–15.5)
WBC: 6.6 10*3/uL (ref 4.0–10.5)

## 2013-10-20 LAB — LACTIC ACID, PLASMA: Lactic Acid, Venous: 0.6 mmol/L (ref 0.5–2.2)

## 2013-10-20 LAB — LIPASE, BLOOD: Lipase: 56 U/L (ref 11–59)

## 2013-10-20 MED ORDER — POTASSIUM CHLORIDE IN NACL 40-0.9 MEQ/L-% IV SOLN
INTRAVENOUS | Status: DC
  Administered 2013-10-20: 75 mL/h via INTRAVENOUS
  Filled 2013-10-20 (×4): qty 1000

## 2013-10-20 MED ORDER — WHITE PETROLATUM GEL
Status: AC
  Administered 2013-10-20: 09:00:00
  Filled 2013-10-20: qty 5

## 2013-10-20 MED ORDER — OXYCODONE HCL 5 MG PO TABS
10.0000 mg | ORAL_TABLET | ORAL | Status: DC | PRN
  Administered 2013-10-21 (×4): 10 mg via ORAL
  Filled 2013-10-20 (×5): qty 2

## 2013-10-20 MED ORDER — POTASSIUM CHLORIDE CRYS ER 20 MEQ PO TBCR
40.0000 meq | EXTENDED_RELEASE_TABLET | Freq: Once | ORAL | Status: AC
  Administered 2013-10-20: 40 meq via ORAL
  Filled 2013-10-20: qty 2

## 2013-10-20 NOTE — Evaluation (Signed)
Physical Therapy Evaluation Patient Details Name: Katherine Potts MRN: [id number] DOB: [birth year] Today's Date: 10/20/2013   History of Present Illness  53 y.o. female admitted with abdominal pain, nausea and vomiting. Hx of ALS, GERD, mitral valve prolapse, pneumonia, and anxiety.  Clinical Impression  Pt admitted with above. Pt currently with functional limitations due to the deficits listed below (see PT Problem List). She reports frequent falls at home and states she was unaware of resources for education on amyotrophic lateral sclerosis. Pt states that she has new onset weakness of left upper extremity over the past 2 days that was not present prior to her admission. currently Min-Mod assist with most mobility assessed today and is a high fall risk. Pt will benefit from skilled PT to increase their independence and safety with mobility to allow discharge to the venue listed below.     Follow Up Recommendations CIR    Equipment Recommendations  None recommended by PT    Recommendations for Other Services OT consult;Rehab consult     Precautions / Restrictions Precautions Precautions: Fall Restrictions Weight Bearing Restrictions: No      Mobility  Bed Mobility Overal bed mobility: Needs Assistance Bed Mobility: Supine to Sit     Supine to sit: Min assist     General bed mobility comments: Min assist for trunk control into seated position from supine. VC for technique. Heavy use of rail.  Transfers Overall transfer level: Needs assistance Equipment used: Rolling walker (2 wheeled) Transfers: Sit to/from Stand Sit to Stand: Mod assist         General transfer comment: Min assist for boost from lowest bed setting, Mod assist to safely control descent into reclining chair. VC for hand placement. Leans heavily over walker prior to assuming upright position.   Ambulation/Gait Ambulation/Gait assistance: Min assist Ambulation Distance (Feet): 65 Feet Assistive  device: Rolling walker (2 wheeled) Gait Pattern/deviations: Step-to pattern;Step-through pattern;Decreased step length - right;Decreased stride length;Ataxic;Staggering left;Staggering right   Gait velocity interpretation: Below normal speed for age/gender General Gait Details: Min assist to maintain balance and stability. Educated on safe DME use with rolling walker. Slightly ataxic with inconsistent foot placement. Pt very fatigued at end of distance with complaints of LLE cramps/spasms. VC for sequencing, especially with turns.  Stairs            Wheelchair Mobility    Modified Rankin (Stroke Patients Only)       Balance Overall balance assessment: Needs assistance;History of Falls Sitting-balance support: No upper extremity supported;Feet supported Sitting balance-Leahy Scale: Fair     Standing balance support: Single extremity supported Standing balance-Leahy Scale: Poor                               Pertinent Vitals/Pain Pain Assessment: No/denies pain    Home Living Family/patient expects to be discharged to:: Private residence Living Arrangements: Children Available Help at Discharge: Family;Available 24 hours/day Type of Home: House Home Access: Ramped entrance     Home Layout: One level Home Equipment: Walker - 2 wheels;Bedside commode;Transport chair;Shower seat      Prior Function Level of Independence: Needs assistance   Gait / Transfers Assistance Needed: Use of RW with assistance for ambulation - could ambulate in community  ADL's / Homemaking Assistance Needed: Bath and dress independently        Hand Dominance   Dominant Hand: Right    Extremity/Trunk Assessment   Upper Extremity Assessment:  Defer to OT evaluation           Lower Extremity Assessment: Generalized weakness (Weakness Lt>Rt, at least 3/5 with LE movements)         Communication   Communication: No difficulties  Cognition Arousal/Alertness:  Awake/alert Behavior During Therapy: WFL for tasks assessed/performed Overall Cognitive Status: Within Functional Limits for tasks assessed                      General Comments General comments (skin integrity, edema, etc.): Pt reports new onset of LUE weakness as of yesterday. Poor motor function in this limb. States she falls very frequently in home "cannot begin to count" how many times she has fallen this year.    Exercises General Exercises - Lower Extremity Ankle Circles/Pumps: AROM;Both;10 reps;Seated Quad Sets: Strengthening;Both;10 reps;Seated      Assessment/Plan    PT Assessment Patient needs continued PT services  PT Diagnosis Difficulty walking;Abnormality of gait;Generalized weakness;Acute pain   PT Problem List Decreased strength;Decreased range of motion;Decreased activity tolerance;Decreased balance;Decreased mobility;Decreased knowledge of use of DME;Decreased knowledge of precautions;Pain  PT Treatment Interventions DME instruction;Gait training;Functional mobility training;Therapeutic activities;Therapeutic exercise;Balance training;Neuromuscular re-education;Patient/family education;Modalities   PT Goals (Current goals can be found in the Care Plan section) Acute Rehab PT Goals Patient Stated Goal: Care for myself PT Goal Formulation: With patient Time For Goal Achievement: 10/27/13 Potential to Achieve Goals: Good    Frequency Min 5X/week   Barriers to discharge Decreased caregiver support Pt lives with son but reports multiple falls at home - question his ability to care for her at home.    Co-evaluation               End of Session Equipment Utilized During Treatment: Gait belt Activity Tolerance: Patient tolerated treatment well Patient left: in chair;with call bell/phone within reach Nurse Communication: Mobility status         Time: 1206-1237 PT Time Calculation (min): 31 min   Charges:   PT Evaluation $Initial PT Evaluation  Tier I: 1 Procedure PT Treatments $Therapeutic Activity: 8-22 mins   PT G Codes:        Charlsie Merles, [place] [phone]   Berton Mount 10/20/2013, 3:04 PM

## 2013-10-20 NOTE — Progress Notes (Signed)
[facility] PROGRESS NOTE  LANDRIE BEALE WUJ:[id number] DOB: 04-27-1960 DOA: 10/19/2013 PCP: Gretel Acre, MD  Assessment/Plan: Principal Problem:   Intractable nausea and vomiting Active Problems:   GERD (gastroesophageal reflux disease)   Depression   ALS (amyotrophic lateral sclerosis)   Closed fracture of left distal radius   1. Intractable nausea and vomiting  Improved, advance diet as tolerated Minimal diarrhea and abdominal pain today. Probable etiology more likely is gastroenteritis.  CT abdomen pelvis is negative Continue Zofran, Protonix Anticipate discharge home tomorrow  Follow GI pathogen panel.  Continue to monitor.    2. ALS. Apparently progressively getting worse Sees Dr. Anne Hahn her neurologist The patient has been having progressive decline over last few months.  With rapid decline over last few days.  At present I would continue to monitor her She will need outpatient followup with her neurologist Continue home pain medication regimen   3. left wrist pain Transverse slightly impacted and angulated fracture of the distal in April 2015 Patient currently wearing a wrist splint We'll repeat x-rays because of worsening pain Patient is tearful PT/OT eval    Code Status: full Family Communication: family updated about patient's clinical progress Disposition Plan:  Anticipate discharge tomorrow   Brief narrative: Katherine Potts is a 53 y.o. female with Past medical history of GERD, anxiety, ALS, chronic pain.  The patient presents with complaints of abdominal pain that has been ongoing since last 4 days associated with nausea and vomiting. The patient has not been able to get her medications at home due to her persistent nausea and vomiting. The ED he also reports that the patient had episodes of diarrhea that has been ongoing since the same time. Due to the diarrhea are normal without any blood in the vomiting is also normal without any  blood.  Patient continues to have diffuse abdominal pain at the time of my evaluation along with nausea and dry heaving he did she complains of generalized fatigue and tiredness with inability to move her hands and activities. There is also reported that the patient is not able to mobilize on her own which she was able to do that prior to this episode.  No other recent change in her medications.  Patient has history of chronic ileus and she has significant decline over last few months with her mobility. Was evaluated by hospice few months ago, and was not qualifying for the same.  The patient is coming from home. And at her baseline independent for most of her ADL.   Consultants:  None  Procedures:  None  Antibiotics:  None  HPI/Subjective: Complaining of left wrist pain, tearful, anxious  Objective: Filed Vitals:   10/19/13 2315 10/19/13 2330 10/20/13 0048 10/20/13 0734  BP: 125/70 124/72 113/76 116/76  Pulse: 51 52 53 49  Temp:   97.7 F (36.5 C) 98.9 F (37.2 C)  TempSrc:   Oral Oral  Resp: Height:    (1.626 m)   Weight:   62.914 kg (138 lb 11.2 oz) 63.322 kg (139 lb 9.6 oz)  SpO2: 93% 95% 95% 99%    Intake/Output Summary (Last 24 hours) at 10/20/13 1256 Last data filed at 10/20/13 0800  Gross per 24 hour  Intake      0 ml  Output    300 ml  Net   -300 ml    Exam:  General: alert & oriented x 3 In NAD  Cardiovascular: RRR, nl S1 s2  Respiratory: Decreased breath sounds at the bases, scattered rhonchi, no crackles  Abdomen: soft +BS NT/ND, no masses palpable  Extremities: No cyanosis and no edema      Data Reviewed: Basic Metabolic Panel:  Recent Labs Lab 10/18/13 0954 10/19/13 1248 10/20/13 0750  NA 141 142 142  K 3.5* 3.7 3.1*  CL 103 106 109  CO2 GLUCOSE 109* 99 71  BUN CREATININE 1.28* 1.27* 1.15*  CALCIUM 9.5 9.7 8.1*    Liver Function Tests:  Recent Labs Lab 10/18/13 0954 10/19/13 1248  10/20/13 0750  AST ALT ALKPHOS 65 61 52  BILITOT 0.3 0.5 0.4  PROT 7.0 7.0 5.5*  ALBUMIN 3.8 3.9 3.1*    Recent Labs Lab 10/19/13 1248 10/20/13 0750  LIPASE 67* 56   No results found for this basename: AMMONIA,  in the last 168 hours  CBC:  Recent Labs Lab 10/18/13 0954 10/19/13 1248 10/20/13 0750  WBC 7.9 10.7* 6.6  NEUTROABS 6.0 8.0*  --   HGB 12.7 12.1 10.9*  HCT 37.5 35.8* 31.7*  MCV 94.0 91.3 94.3  PLT 191 209 157    Cardiac Enzymes: No results found for this basename: CKTOTAL, CKMB, CKMBINDEX, TROPONINI,  in the last 168 hours BNP (last 3 results) No results found for this basename: PROBNP,  in the last 8760 hours   CBG: No results found for this basename: GLUCAP,  in the last 168 hours  Recent Results (from the past 240 hour(s))  URINE CULTURE     Status: None   Collection Time    10/18/13  1:59 PM      Result Value Ref Range Status   Specimen Description URINE, CLEAN CATCH   Final   Special Requests Normal   Final   Culture  Setup Time     Final   Value: 10/18/2013 17:55     Performed at [facility] Count     Final   Value: >=100,000 COLONIES/ML     Performed at [facility]   Culture     Final   Value: Multiple bacterial morphotypes present, none predominant. Suggest appropriate recollection if clinically indicated.     Performed at [facility]   Report Status 10/19/2013 FINAL   Final     Studies: Ct Abdomen Pelvis Wo Contrast  10/19/2013   CLINICAL DATA:  Left-sided abdominal pain for 4 days  EXAM: CT ABDOMEN AND PELVIS WITHOUT CONTRAST  TECHNIQUE: Multidetector CT imaging of the abdomen and pelvis was performed following the standard protocol without IV contrast.  COMPARISON:  None.  FINDINGS: Lung bases clear except for 6 mm calcified nodule left lower lobe likely a benign granuloma. Bilateral breast implants noted.  Liver, gallbladder, spleen, pancreas, and adrenal glands normal. Right  kidney not identified. Left kidney normal. Probable small splenule left upper quadrant.  Mild calcification of the aorta. Stomach and small bowel normal. Appendix normal. Mild diverticulosis distal descending colon, with large bowel otherwise negative.  Bladder and reproductive organs are normal. No acute musculoskeletal findings. No adenopathy or ascites.  IMPRESSION: No acute abnormalities.   Electronically Signed   By: Esperanza Heir M.D.   On: 10/19/2013 17:59   Dg Wrist Complete Left  10/20/2013   CLINICAL DATA:  LEFT wrist fracture with ORIF on 07/09/2013  EXAM: LEFT WRIST - COMPLETE 3+ VIEW  COMPARISON:  06/19/2013  FINDINGS: Osseous demineralization.  Volar plate and multiple screws identified at the distal LEFT radius post ORIF of the previously identified distal LEFT radial metaphyseal fracture.  Mild obliquity of positioning on lateral view limits assessment.  Nonunion of a previously identified ulnar styloid fracture fragment.  Joint spaces grossly preserved.  No additional fracture or dislocation.  IMPRESSION: Post ORIF of previously identified distal LEFT radial metaphyseal fracture.  Persistent visualization of ulnar styloid fracture fragment.  Osseous demineralization.   Electronically Signed   By: Ulyses Southward M.D.   On: 10/20/2013 12:10    Scheduled Meds: . ARIPiprazole  2 mg Oral Daily  . dexamethasone  2 mg Oral Daily  . enoxaparin (LOVENOX) injection  40 mg Subcutaneous Q24H  . FLUoxetine  20 mg Oral Daily  . pantoprazole  40 mg Oral BID AC  . potassium chloride  40 mEq Oral Once  . pregabalin  150 mg Oral TID   Continuous Infusions: . 0.9 % NaCl with KCl 40 mEq / L      Principal Problem:   Intractable nausea and vomiting Active Problems:   GERD (gastroesophageal reflux disease)   Depression   ALS (amyotrophic lateral sclerosis)   Closed fracture of left distal radius    Time spent: 40 minutes   [facility]  [facility] Pager [phone]. If 7PM-7AM,  please contact night-coverage at [email or web address], password [facility] 10/20/2013, 12:56 PM  LOS: 1 day

## 2013-10-20 NOTE — ED Provider Notes (Signed)
Medical screening examination/treatment/procedure(s) were conducted as a shared visit with non-physician practitioner(s) and myself.  I personally evaluated the patient during the encounter. Pt presents w/ less than 24 hrs of n/v/d.  On PE after IVF, phenergan, zofran, pt feeling much better. She developed back spasms relieved by valium.  She has been able to tolerate PO. Cardiopulm & abdominal exam benign. Will d/c home w/ zofran. Return precautions given for new or worsening symptoms including worsening pain, fever, inability to tolerate PO.    EKG Interpretation None        Toy Cookey, MD 10/20/13 1130

## 2013-10-20 NOTE — Progress Notes (Signed)
Patient arrived onto unit via stretcher. Patient transferred onto bed. VSS, per patient her nausea is a little better.

## 2013-10-20 NOTE — Evaluation (Addendum)
Occupational Therapy Evaluation Patient Details Name: Katherine Potts MRN: [id number] DOB: [birth year] Today's Date: 10/20/2013    History of Present Illness 53 y.o. female admitted with abdominal pain, nausea and vomiting. Hx of ALS, GERD, mitral valve prolapse, pneumonia, and anxiety.   Clinical Impression   Pt admitted with above. Pt independent with ADLs, PTA. Feel pt will benefit from acute OT to increase independence prior to d/c. Placed order for resting hand splint for pt's LUE. Pt required Max assist during ambulation on one occassion to prevent fall.  Recommending CIR for d/c and feel pt is great candidate.    Follow Up Recommendations  CIR    Equipment Recommendations  None recommended by OT    Recommendations for Other Services       Precautions / Restrictions Precautions Precautions: Fall Restrictions Weight Bearing Restrictions: No      Mobility Bed Mobility Overal bed mobility: Needs Assistance Bed Mobility: Supine to Sit;Sit to Supine     Supine to sit: Min assist Sit to supine: Supervision   General bed mobility comments: assist with trunk to sit EOB. Cues for technique.  Transfers Overall transfer level: Needs assistance Equipment used: Rolling walker (2 wheeled) Transfers: Sit to/from Stand Sit to Stand: Min guard              Balance                                            ADL Overall ADL's : Needs assistance/impaired     Grooming: Wash/dry hands;Oral care;Standing;Minimal assistance               Lower Body Dressing: Moderate assistance;Sit to/from stand   Toilet Transfer: Minimal assistance;Ambulation;RW (bed)           Functional mobility during ADLs: Maximal assistance;Rolling walker;Minimal assistance General ADL Comments: Recommended pt wear left wrist splint she had in room to keep wrist in neutral position. Educated on safety tips for home (sitting for most of LB ADLs, safe shoewear, use of  bag on walker). Pt ambulated to sink to perform some grooming tasks. Educated on AE for LB ADLs-pt tried using sockaid.  Assisted with donning left wrist splint. Rolled towel placed under pt's left hand to help keep fingers in more extension.     Vision                     Perception     Praxis      Pertinent Vitals/Pain Pain Assessment: 0-10 Pain Score: 4  Pain Location: abdomen Pain Descriptors / Indicators: Sore Pain Intervention(s): Monitored during session;Repositioned     Hand Dominance Right   Extremity/Trunk Assessment Upper Extremity Assessment Upper Extremity Assessment: LUE deficits/detail;Generalized weakness LUE Deficits / Details: unable to extend wrist against gravity or extend fingers; pt with weak grasp; shoulder flexion approximatly 90 degrees    Lower Extremity Assessment Lower Extremity Assessment: Defer to PT evaluation       Communication Communication Communication: No difficulties   Cognition Arousal/Alertness: Awake/alert Behavior During Therapy: WFL for tasks assessed/performed Overall Cognitive Status: Within Functional Limits for tasks assessed                     General Comments       Exercises       Shoulder Instructions      Home Living  Family/patient expects to be discharged to:: Private residence Living Arrangements: Children Available Help at Discharge: Family;Available 24 hours/day Type of Home: House Home Access: Ramped entrance     Home Layout: One level     Bathroom Shower/Tub: Producer, television/film/video: Standard     Home Equipment: Environmental consultant - 2 wheels;Bedside commode;Transport chair;Shower seat;Adaptive equipment Adaptive Equipment: Feeding equipment        Prior Functioning/Environment Level of Independence: Needs assistance  Gait / Transfers Assistance Needed: Use of RW with assistance for ambulation - could ambulate in community ADL's / Homemaking Assistance Needed: Bath and dress  independently        OT Diagnosis: Acute pain;Generalized weakness   OT Problem List: Decreased strength;Decreased knowledge of use of DME or AE;Decreased knowledge of precautions;Pain;Impaired UE functional use;Decreased activity tolerance;Impaired balance (sitting and/or standing)   OT Treatment/Interventions: Self-care/ADL training;DME and/or AE instruction;Therapeutic activities;Patient/family education;Balance training;Splinting;Therapeutic exercise    OT Goals(Current goals can be found in the care plan section) Acute Rehab OT Goals Patient Stated Goal: not stated OT Goal Formulation: With patient Time For Goal Achievement: 11/03/13 Potential to Achieve Goals: Good ADL Goals Pt Will Perform Eating: with modified independence;with adaptive utensils;sitting Pt Will Perform Grooming: with adaptive equipment;standing;with set-up Pt Will Perform Lower Body Dressing: with set-up;with supervision;sit to/from stand Pt Will Transfer to Toilet: with supervision;ambulating Pt Will Perform Toileting - Clothing Manipulation and hygiene: with modified independence;sit to/from stand Additional ADL Goal #1: Pt will be independent with HEP for bilateral UE's.  OT Frequency: Min 2X/week   Barriers to D/C:            Co-evaluation              End of Session Equipment Utilized During Treatment: Gait belt;Rolling walker;Other (comment) (left wrist splint)  Activity Tolerance: Patient tolerated treatment well Patient left: in bed;with call bell/phone within reach   Time: 1420-1452 OT Time Calculation (min): 32 min Charges:  OT General Charges $OT Visit: 1 Procedure OT Evaluation $Initial OT Evaluation Tier I: 1 Procedure OT Treatments $Self Care/Home Management : 8-22 mins G-CodesEarlie Raveling OTR/L [phone] 10/20/2013, 5:47 PM

## 2013-10-20 NOTE — ED Provider Notes (Signed)
Medical screening examination/treatment/procedure(s) were performed by non-physician practitioner and as supervising physician I was immediately available for consultation/collaboration.   EKG Interpretation   Date/Time:  Monday October 19 2013 12:50:53 EDT Ventricular Rate:  47 PR Interval:  153 QRS Duration: 83 QT Interval:  510 QTC Calculation: 451 R Axis:   71 Text Interpretation:  Bradycardia with irregular rate Borderline  repolarization abnormality Confirmed by Bebe Shaggy  MD, Goldman Birchall ([id number]) on  10/19/2013 12:59:42 PM        Joya Gaskins, MD 10/20/13 [phone]

## 2013-10-20 NOTE — Progress Notes (Signed)
Physical medicine and rehabilitation consult requested. Chart has been reviewed.  Await physical and occupational therapy evaluation recommendations and followup with rehabilitation consult at that time.

## 2013-10-20 NOTE — Progress Notes (Signed)
Report on patient received from [facility] from ED

## 2013-10-20 NOTE — Progress Notes (Signed)
Rehab Admissions Coordinator Note:  Patient was screened by Clois Dupes for appropriateness for an Inpatient Acute Rehab Consult per PT recommendation.  At this time, we await rehab consult completion to determine rehab venue options.  Clois Dupes 10/20/2013, 3:46 PM  I can be reached at [phone].

## 2013-10-21 ENCOUNTER — Inpatient Hospital Stay (HOSPITAL_COMMUNITY)
Admission: RE | Admit: 2013-10-21 | Discharge: 2013-11-03 | DRG: 945 | Disposition: A | Discharge status: Home Health | Payer: Medicare Other | Source: Intra-hospital | Attending: Physical Medicine & Rehabilitation | Admitting: Physical Medicine & Rehabilitation

## 2013-10-21 DIAGNOSIS — F329 Major depressive disorder, single episode, unspecified: Secondary | ICD-10-CM | POA: Diagnosis not present

## 2013-10-21 DIAGNOSIS — S52502S Unspecified fracture of the lower end of left radius, sequela: Secondary | ICD-10-CM

## 2013-10-21 DIAGNOSIS — F3289 Other specified depressive episodes: Secondary | ICD-10-CM

## 2013-10-21 DIAGNOSIS — G894 Chronic pain syndrome: Secondary | ICD-10-CM | POA: Diagnosis not present

## 2013-10-21 DIAGNOSIS — R29898 Other symptoms and signs involving the musculoskeletal system: Secondary | ICD-10-CM

## 2013-10-21 DIAGNOSIS — Z79899 Other long term (current) drug therapy: Secondary | ICD-10-CM | POA: Diagnosis not present

## 2013-10-21 DIAGNOSIS — F32A Depression, unspecified: Secondary | ICD-10-CM

## 2013-10-21 DIAGNOSIS — R4182 Altered mental status, unspecified: Secondary | ICD-10-CM | POA: Diagnosis not present

## 2013-10-21 DIAGNOSIS — F1210 Cannabis abuse, uncomplicated: Secondary | ICD-10-CM

## 2013-10-21 DIAGNOSIS — K219 Gastro-esophageal reflux disease without esophagitis: Secondary | ICD-10-CM

## 2013-10-21 DIAGNOSIS — T481X5A Adverse effect of skeletal muscle relaxants [neuromuscular blocking agents], initial encounter: Secondary | ICD-10-CM

## 2013-10-21 DIAGNOSIS — T403X5A Adverse effect of methadone, initial encounter: Secondary | ICD-10-CM

## 2013-10-21 DIAGNOSIS — F4323 Adjustment disorder with mixed anxiety and depressed mood: Secondary | ICD-10-CM

## 2013-10-21 DIAGNOSIS — Z515 Encounter for palliative care: Secondary | ICD-10-CM

## 2013-10-21 DIAGNOSIS — Z5189 Encounter for other specified aftercare: Principal | ICD-10-CM

## 2013-10-21 DIAGNOSIS — G1221 Amyotrophic lateral sclerosis: Secondary | ICD-10-CM

## 2013-10-21 DIAGNOSIS — IMO0001 Reserved for inherently not codable concepts without codable children: Secondary | ICD-10-CM

## 2013-10-21 DIAGNOSIS — Z87891 Personal history of nicotine dependence: Secondary | ICD-10-CM | POA: Diagnosis not present

## 2013-10-21 DIAGNOSIS — S5290XD Unspecified fracture of unspecified forearm, subsequent encounter for closed fracture with routine healing: Secondary | ICD-10-CM

## 2013-10-21 DIAGNOSIS — R5381 Other malaise: Secondary | ICD-10-CM

## 2013-10-21 DIAGNOSIS — R109 Unspecified abdominal pain: Secondary | ICD-10-CM

## 2013-10-21 DIAGNOSIS — R531 Weakness: Secondary | ICD-10-CM

## 2013-10-21 LAB — CBC
HEMATOCRIT: 34 % — AB (ref 36.0–46.0)
Hemoglobin: 11.8 g/dL — ABNORMAL LOW (ref 12.0–15.0)
MCH: 32.3 pg (ref 26.0–34.0)
MCHC: 34.7 g/dL (ref 30.0–36.0)
MCV: 93.2 fL (ref 78.0–100.0)
PLATELETS: 180 10*3/uL (ref 150–400)
RBC: 3.65 MIL/uL — ABNORMAL LOW (ref 3.87–5.11)
RDW: 12.6 % (ref 11.5–15.5)
WBC: 8.8 10*3/uL (ref 4.0–10.5)

## 2013-10-21 LAB — CREATININE, SERUM
Creatinine, Ser: 1.26 mg/dL — ABNORMAL HIGH (ref 0.50–1.10)
GFR calc Af Amer: 56 mL/min — ABNORMAL LOW (ref >90)
GFR calc non Af Amer: 48 mL/min — ABNORMAL LOW (ref >90)

## 2013-10-21 MED ORDER — DEXAMETHASONE 2 MG PO TABS
2.0000 mg | ORAL_TABLET | Freq: Every day | ORAL | Status: DC
  Administered 2013-10-22 – 2013-11-03 (×13): 2 mg via ORAL
  Filled 2013-10-21 (×14): qty 1

## 2013-10-21 MED ORDER — ENOXAPARIN SODIUM 40 MG/0.4ML ~~LOC~~ SOLN: 40.0000 mg | SUBCUTANEOUS | Status: DC

## 2013-10-21 MED ORDER — SORBITOL 70 % SOLN
30.0000 mL | Freq: Every day | Status: DC | PRN
  Administered 2013-10-23 – 2013-10-28 (×3): 30 mL via ORAL
  Filled 2013-10-21 (×4): qty 30

## 2013-10-21 MED ORDER — ACETAMINOPHEN 325 MG PO TABS: 325.0000 mg | ORAL_TABLET | ORAL | Status: DC | PRN

## 2013-10-21 MED ORDER — LORAZEPAM 1 MG PO TABS
2.0000 mg | ORAL_TABLET | Freq: Three times a day (TID) | ORAL | Status: DC | PRN
  Administered 2013-10-22 – 2013-11-03 (×19): 2 mg via ORAL
  Filled 2013-10-21 (×21): qty 2

## 2013-10-21 MED ORDER — ONDANSETRON HCL 4 MG/2ML IJ SOLN: 4.0000 mg | Freq: Four times a day (QID) | INTRAMUSCULAR | Status: DC | PRN

## 2013-10-21 MED ORDER — DIAZEPAM 5 MG PO TABS
5.0000 mg | ORAL_TABLET | Freq: Two times a day (BID) | ORAL | Status: DC | PRN
  Administered 2013-10-22 – 2013-10-29 (×10): 5 mg via ORAL
  Filled 2013-10-21 (×12): qty 1

## 2013-10-21 MED ORDER — NON FORMULARY: 40.0000 mg | Freq: Two times a day (BID) | Status: DC

## 2013-10-21 MED ORDER — ONDANSETRON HCL 4 MG PO TABS
4.0000 mg | ORAL_TABLET | Freq: Four times a day (QID) | ORAL | Status: DC | PRN
  Administered 2013-10-22 – 2013-11-02 (×3): 4 mg via ORAL
  Filled 2013-10-21 (×4): qty 1

## 2013-10-21 MED ORDER — FLUOXETINE HCL 20 MG PO CAPS
20.0000 mg | ORAL_CAPSULE | Freq: Every day | ORAL | Status: DC
  Administered 2013-10-22 – 2013-11-03 (×13): 20 mg via ORAL
  Filled 2013-10-21 (×14): qty 1

## 2013-10-21 MED ORDER — ARIPIPRAZOLE 2 MG PO TABS
2.0000 mg | ORAL_TABLET | Freq: Every day | ORAL | Status: DC
  Administered 2013-10-22 – 2013-11-03 (×13): 2 mg via ORAL
  Filled 2013-10-21 (×14): qty 1

## 2013-10-21 MED ORDER — ESOMEPRAZOLE MAGNESIUM 40 MG PO CPDR
40.0000 mg | DELAYED_RELEASE_CAPSULE | Freq: Two times a day (BID) | ORAL | Status: DC
  Administered 2013-10-21 – 2013-11-03 (×26): 40 mg via ORAL
  Filled 2013-10-21 (×29): qty 1

## 2013-10-21 MED ORDER — DICYCLOMINE HCL 10 MG PO CAPS
10.0000 mg | ORAL_CAPSULE | Freq: Three times a day (TID) | ORAL | Status: DC
  Administered 2013-10-21 (×2): 10 mg via ORAL
  Filled 2013-10-21 (×3): qty 1

## 2013-10-21 MED ORDER — PANTOPRAZOLE SODIUM 40 MG PO TBEC: 40.0000 mg | DELAYED_RELEASE_TABLET | Freq: Two times a day (BID) | ORAL | Status: DC

## 2013-10-21 MED ORDER — PREGABALIN 50 MG PO CAPS
150.0000 mg | ORAL_CAPSULE | Freq: Three times a day (TID) | ORAL | Status: DC
  Administered 2013-10-21 – 2013-10-23 (×6): 150 mg via ORAL
  Filled 2013-10-21 (×6): qty 3

## 2013-10-21 MED ORDER — METHADONE HCL 5 MG PO TABS
5.0000 mg | ORAL_TABLET | Freq: Three times a day (TID) | ORAL | Status: DC | PRN
  Administered 2013-10-22 – 2013-10-24 (×4): 5 mg via ORAL
  Filled 2013-10-21 (×4): qty 1

## 2013-10-21 MED ORDER — ENOXAPARIN SODIUM 40 MG/0.4ML ~~LOC~~ SOLN
40.0000 mg | SUBCUTANEOUS | Status: DC
  Administered 2013-10-22 – 2013-11-02 (×12): 40 mg via SUBCUTANEOUS
  Filled 2013-10-21 (×13): qty 0.4

## 2013-10-21 MED ORDER — DICYCLOMINE HCL 10 MG PO CAPS
10.0000 mg | ORAL_CAPSULE | Freq: Three times a day (TID) | ORAL | Status: DC
  Administered 2013-10-22 – 2013-10-23 (×5): 10 mg via ORAL
  Filled 2013-10-21 (×10): qty 1

## 2013-10-21 MED ORDER — OXYCODONE HCL 5 MG PO TABS
10.0000 mg | ORAL_TABLET | ORAL | Status: DC | PRN
  Administered 2013-10-21 – 2013-10-22 (×5): 10 mg via ORAL
  Administered 2013-10-24: 5 mg via ORAL
  Administered 2013-10-26 – 2013-11-01 (×5): 10 mg via ORAL
  Filled 2013-10-21 (×13): qty 2

## 2013-10-21 NOTE — Progress Notes (Signed)
[facility] Admission Coordinator Signed Physical Medicine and Rehabilitation PMR Pre-admission Service date: 10/21/2013 1:18 PM  Related encounter: Admission (Current) from 10/19/2013 in [facility]   PMR Admission Coordinator Pre-Admission Assessment  Patient: Katherine Potts is an 53 y.o., female  MRN: [id number]  DOB: 17-Nov-1960  Height:  (162.6 cm)  Weight: 63.322 kg (139 lb 9.6 oz)  Insurance Information  HMO: PPO: PCP: IPA: 80/20: OTHER:  PRIMARY: Medicare A and B Policy#: [id number] Subscriber: self  CM Name: Phone#: Fax#:  Pre-Cert#: Employer: disabled  Benefits: Phone #: Name:  Eff. Date: 02/26/13 Deduct: $1260 Out of Pocket Max: none Life Max: none  CIR: 100% SNF: 100% first 20 days per Medicare guidelines  Outpatient: 80% Co-Pay: 20%  Home Health: 100% per Medicare guidelines Co-Pay: no  DME: 80% Co-Pay: 20%  Providers: Pt. choice SECONDARY: BCBS  PPO Policy#: [id number] Subscriber: self Phone#: [phone]  Emergency Contact Information    Contact Information     Name  Relation  Home  Work  Mobile     [place]  Friend  [phone]       Annelie, Boak  [phone]       [name]    [phone]        Current Medical History  Patient Admitting Diagnosis: ALS with spasticity, LUE and distal RLE weakness, no sign bulbar dysfunction thus far  History of Present Illness: Katherine Potts is a 53 y.o. 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 Anne Hahn. Patient lives with her 63 year old son and assistance as needed. She used a walker and needed assistance for activities of daily living prior to admission. Admitted 10/19/2013 with abdominal pain with nausea, diarrhea, vomiting as well as generalized weakness and falls. Patient with progressive decline over the  last few months. CT of abdomen and pelvis negative. Subcutaneous Lovenox added for DVT prophylaxis. Diet slowly advanced to regular consistency. Noted pronounced deconditioning with limited mobility with physical therapy evaluation completed on 25 2015 with recommendations of physical medicine rehabilitation consult. Pt. admttted to IP rehab on 10/21/13.  Pt states she was diagnosed with ALS in May of this year however neuro notes indicate diagnosis in 12/2012, goes to [facility] and sees local neurologist Dr Anne Hahn as well    Past Medical History    Past Medical History    Diagnosis  Date    .  GERD (gastroesophageal reflux disease)     .  Anxiety     .  Depression     .  MVP (mitral valve prolapse)     .  Cervical spondylosis     .  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  Pneumonia     .  Constipation      Family History  family history includes ALS in her cousin; Bipolar disorder in her brother; Dementia in her mother; Hypertension in her father.  Prior Rehab/Hospitalizations: no IP rehabilitation in the past  Current Medications  Current facility-administered medications:0.9 % NaCl with KCl 40 mEq / L infusion, , Intravenous, Continuous, Richarda Overlie, MD, Last Rate: 75 mL/hr at 10/20/13 1534, 75 mL/hr at 10/20/13 1534; acetaminophen (TYLENOL) suppository 650 mg, 650 mg, Rectal, Q6H PRN, Lynden Oxford, MD; acetaminophen (TYLENOL) tablet 650 mg, 650 mg, Oral, Q6H PRN, Lynden Oxford, MD  ARIPiprazole (ABILIFY) tablet 2 mg, 2 mg, Oral, Daily, Lynden Oxford, MD, 2 mg at 10/21/13 0929; dexamethasone (DECADRON) tablet 2 mg, 2 mg, Oral, Daily, Lynden Oxford, MD, 2 mg at 10/21/13 1610; diazepam (VALIUM) tablet 5 mg, 5 mg, Oral, Q12H PRN, Lynden Oxford, MD, 5 mg at 10/21/13 0131; dicyclomine (BENTYL) capsule 10 mg, 10 mg, Oral, TID AC, Ripudeep K Rai, MD, 10 mg at 10/21/13 1140  enoxaparin (LOVENOX) injection 40 mg, 40 mg, Subcutaneous, Q24H, Lynden Oxford, MD, 40 mg  at 10/21/13 1140; FLUoxetine (PROZAC) capsule 20 mg, 20 mg, Oral, Daily, Lynden Oxford, MD, 20 mg at 10/21/13 9604; LORazepam (ATIVAN) tablet 2 mg, 2 mg, Oral, TID PRN, Lynden Oxford, MD, 2 mg at 10/21/13 5409; methadone (DOLOPHINE) tablet 5 mg, 5 mg, Oral, Q8H PRN, Lynden Oxford, MD, 5 mg at 10/21/13 1106  ondansetron (ZOFRAN) injection 4 mg, 4 mg, Intravenous, Q6H PRN, Lynden Oxford, MD, 4 mg at 10/20/13 1700; ondansetron (ZOFRAN) tablet 4 mg, 4 mg, Oral, Q6H PRN, Lynden Oxford, MD; oxyCODONE (Oxy IR/ROXICODONE) immediate release tablet 10 mg, 10 mg, Oral, Q4H PRN, Roma Kayser Schorr, NP, 10 mg at 10/21/13 1129; pantoprazole (PROTONIX) EC tablet 40 mg, 40 mg, Oral, BID AC, Lynden Oxford, MD, 40 mg at 10/21/13 8119  pregabalin (LYRICA) capsule 150 mg, 150 mg, Oral, TID, Lynden Oxford, MD, 150 mg at 10/21/13 0920; promethazine (PHENERGAN) tablet 25 mg, 25 mg, Oral, Q6H PRN, Lynden Oxford, MD, 25 mg at 10/20/13 0216  Patients Current Diet: regular with thins  Precautions / Restrictions  Precautions  Precautions: Fall  Precaution Comments: Recent increase in numbers of falls  Restrictions  Weight Bearing Restrictions: No  Prior Activity Level  Limited Community (1-2x/wk): P.t reports she goes out occasionally with son to the grocery store or to appointments. She no longer drives.  Home Assistive Devices / Equipment  Home Assistive Devices/Equipment: Systems developer (specify type);Bedside commode/3-in-1  Home Equipment: Walker - 2 wheels;Bedside commode;Transport chair;Shower seat;Adaptive equipment  Prior Functional Level  Prior Function  Level of Independence: Needs assistance  Gait / Transfers Assistance Needed: Use of RW with assistance for ambulation - could ambulate in community  ADL's / Homemaking Assistance Needed: Bath and dress independently  Current Functional Level    Cognition  Overall Cognitive Status: Within Functional Limits for tasks assessed  Orientation Level: Oriented X4     Extremity Assessment  (includes Sensation/Coordination)      ADLs  Overall ADL's : Needs assistance/impaired  Grooming: Wash/dry hands;Oral care;Standing;Minimal assistance  Lower Body Dressing: Moderate assistance;Sit to/from stand  Toilet Transfer: Minimal assistance;Ambulation;Min guard;RW (bed)  Functional mobility during ADLs: Maximal assistance;Rolling walker;Min guard;Minimal assistance  General ADL Comments: Recommended pt wear left wrist splint she had in room to keep wrist in neutral position. Educated on safety tips for home (sitting for most of LB ADLs, safe shoewear, use of bag on walker). Pt ambulated to sink to perform some grooming tasks. Educated on AE for LB ADLs-pt tried using sockaid. Assisted with donning left wrist splint.    Mobility  Overal bed mobility: Needs Assistance  Bed Mobility: Supine to Sit;Sit to Supine  Supine to sit: Min assist  Sit to supine: Supervision  General bed mobility comments: assist with trunk to sit EOB. Cues for technique.    Transfers  Overall transfer level: Needs assistance  Equipment used: Rolling walker (2 wheeled)  Transfers: Sit to/from Stand  Sit to Stand: Min guard  General transfer comment: Min assist for boost from lowest bed setting, Mod assist  to safely control descent into reclining chair. VC for hand placement. Leans heavily over walker prior to assuming upright position.    Ambulation / Gait / Stairs / Wheelchair Mobility  Ambulation/Gait  Ambulation/Gait assistance: Occupational psychologist (Feet): 65 Feet  Assistive device: Rolling walker (2 wheeled)  Gait Pattern/deviations: Step-to pattern;Step-through pattern;Decreased step length - right;Decreased stride length;Ataxic;Staggering left;Staggering right  Gait velocity interpretation: Below normal speed for age/gender  General Gait Details: Min assist to maintain balance and stability. Educated on safe DME use with rolling walker. Slightly ataxic with inconsistent  foot placement. Pt very fatigued at end of distance with complaints of LLE cramps/spasms. VC for sequencing, especially with turns.    Posture / Balance     Special needs/care consideration  Continuous Drip IV 0.9% NaCl with KCI 18mEq/L 75 ml/hr  Skin Bruise over right eye from fall; pt. Reports other bruises on body from falls  Bowel mgmt: Diarrhea on 10/19/13; no BM since that time  Bladder mgmt: stress incontinence; otherwise continent with use of BSC  Diabetic mgmt no    Previous Home Environment  Living Arrangements: Children  Available Help at Discharge: Family;Available 24 hours/day  Type of Home: House  Home Layout: One level  Home Access: Ramped entrance  Bathroom Shower/Tub: Pension scheme manager: Standard  Bathroom Accessibility: Yes  How Accessible: Accessible via walker;Other (comment) (pt. 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 Says she had to retire in April of '15. Pt. Is very open in discussing her ALS and states she has been told she has 3 years to live but wants to "fight to live 5 years"  Anticipated Caregiver: son, Casimiro Needle, 47 years old; pt. states he has indicated he wants to be her primary caregiver. Pt. is widowed ; son's dad died when he was a year old. Son has experience caring for his grandmother until her passing with home hospice.  Ability/Limitations of Caregiver: Son is  not in school or employed. Pt. states she is trying to get him to take online classes. She states her son "needs counseling" due to the reality that he has lost his dad and will ultimately lose his mom.  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 has much equipment in the home already  Pt/Family Agrees to Admission and willing to participate: Yes  Program Orientation Provided & Reviewed with Pt/Caregiver Including Roles & Responsibilities: Yes  Decrease burden of Care through IP rehab admission: no  Possible need for SNF placement upon discharge: Not anticipated  Patient Condition: This patient's condition remains as documented in the consult dated 10/21/13 , in which the Rehabilitation Physician determined and documented that the patient's condition  is appropriate for intensive rehabilitative care in an inpatient rehabilitation facility. Will admit to inpatient rehab today.  Preadmission Screen Completed By: Brendia Sacks , 10/21/2013 1:18 PM  ______________________________________________________________________  Discussed status with Dr. Riley Kill on 10/21/13 at 1340 and received telephone approval for admission today.  Admission Coordinator: Kathleen Argue time 1610 Dorna Bloom 10/21/13    Cosigned by: Ranelle Oyster, MD [10/21/2013 2:33 PM]

## 2013-10-21 NOTE — Progress Notes (Signed)
PT Cancellation Note  Patient Details Name: Katherine Potts MRN: [id number] DOB: [birth year]   Cancelled Treatment:    Reason Eval/Treat Not Completed: Other (comment) (Pt awaiting transfer to inpatient rehab) Spoke with Darl Pikes from inpatient rehab admissions. Reports patient to be admitted to inpatient rehab this afternoon.   [address] [place], [place] [phone]   Berton Mount 10/21/2013, 2:58 PM

## 2013-10-21 NOTE — H&P (Signed)
Physical Medicine and Rehabilitation Admission H&P    Chief Complaint: weakness, falls : HPI: Katherine Potts is a 53 y.o. 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 Jannifer Franklin diagnosed November of 2014 and also goes to [facility]. Patient lives with her 6 year old son and assistance as needed. She used a walker and needed assistance for activities of daily living prior to admission. Admitted 10/19/2013 with abdominal pain with nausea, diarrhea, vomiting as well as generalized weakness and falls. Patient with progressive decline over the last few months. CT of abdomen and pelvis negative. Subcutaneous Lovenox added for DVT prophylaxis. Diet slowly advanced to regular consistency. The patient ultimately become severely deconditioning with limited mobility.  physical therapy became involved, PM&R was consulted, and it was felt the patient would benefit from an inpatient rehab admission.   ROS Review of Systems  Cardiovascular: Positive for palpitations.  Gastrointestinal: Positive for constipation.  GERD  Musculoskeletal: Positive for back pain, falls and myalgias/spasticity.  Psychiatric/Behavioral: Positive for depression.  Anxiety  All other systems reviewed and are negative  Past Medical History  Diagnosis Date  . GERD (gastroesophageal reflux disease)   . Anxiety   . Depression   . MVP (mitral valve prolapse)   . Cervical spondylosis   . 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 Pneumonia   . Constipation    Past Surgical History  Procedure Laterality Date  . Eye surgery    . Breast surgery    . Vagina surgery      mesh  . Kidney donation      Right nephrectomy  . Abdominal hernia repair    . Orif wrist fracture  07/09/2013    DR Caralyn Guile  . Orif wrist fracture Left 07/09/2013    Procedure: OPEN REDUCTION  INTERNAL FIXATION (ORIF) LEFT  WRIST FRACTURE;  Surgeon: Linna Hoff, MD;  Location: [place];  Service: Orthopedics;  Laterality: Left;   Family History  Problem Relation Age of Onset  . Bipolar disorder Brother   . Dementia Mother   . Hypertension Father   . ALS Cousin    Social History:  reports that she has quit smoking. Her smoking use included Cigarettes and Cigars. She smoked 0.40 packs per day. She has never used smokeless tobacco. She reports that she uses illicit drugs (Marijuana). She reports that she does not drink alcohol. Allergies:  Allergies  Allergen Reactions  . Bactrim [Sulfamethoxazole-Trimethoprim] Nausea Only  . Ciprofloxacin Nausea And Vomiting   Medications Prior to Admission  Medication Sig Dispense Refill  . ARIPiprazole (ABILIFY) 2 MG tablet Take 2 mg by mouth daily.      . calcium carbonate (TUMS - DOSED IN MG ELEMENTAL CALCIUM) 500 MG chewable tablet Chew 1 tablet by mouth daily.      Marland Kitchen dexamethasone (DECADRON) 2 MG tablet Take 2 mg by mouth daily.      . diazepam (VALIUM) 5 MG tablet Take 1 tablet (5 mg total) by mouth every 12 (twelve) hours as needed for muscle spasms.  60 tablet  1  . esomeprazole (NEXIUM) 40 MG capsule Take 40 mg by mouth daily before breakfast.      . FLUoxetine (PROZAC) 20 MG capsule Take 20 mg by mouth daily.      Marland Kitchen LORazepam (ATIVAN) 1 MG tablet Take 2 mg by mouth 3 (three) times daily as  needed for anxiety.      Marland Kitchen LYRICA 150 MG capsule Take 150 mg by mouth 3 (three) times daily.      . methadone (DOLOPHINE) 5 MG tablet Take 5 mg by mouth every 8 (eight) hours as needed for severe pain.       Marland Kitchen ondansetron (ZOFRAN) 4 MG tablet Take 1 tablet (4 mg total) by mouth every 6 (six) hours.  12 tablet  0  . promethazine (PHENERGAN) 25 MG tablet Take 25 mg by mouth every 6 (six) hours as needed for nausea or vomiting.      . vitamin C (ASCORBIC ACID) 500 MG tablet Take 1 tablet (500 mg total) by mouth daily.  50 tablet  0    Home: Home  Living Family/patient expects to be discharged to:: Private residence Living Arrangements: Children Available Help at Discharge: Family;Available 24 hours/day Type of Home: House Home Access: Ramped entrance Home Layout: One level Home Equipment: Walker - 2 wheels;Bedside commode;Transport chair;Shower seat;Adaptive equipment Adaptive Equipment: Feeding equipment   Functional History: Prior Function Level of Independence: Needs assistance Gait / Transfers Assistance Needed: Use of RW with assistance for ambulation - could ambulate in community ADL's / Homemaking Assistance Needed: Bath and dress independently  Functional Status:  Mobility: Bed Mobility Overal bed mobility: Needs Assistance Bed Mobility: Supine to Sit;Sit to Supine Supine to sit: Min assist Sit to supine: Supervision General bed mobility comments: assist with trunk to sit EOB. Cues for technique. Transfers Overall transfer level: Needs assistance Equipment used: Rolling walker (2 wheeled) Transfers: Sit to/from Stand Sit to Stand: Min guard General transfer comment: Min assist for boost from lowest bed setting, Mod assist to safely control descent into reclining chair. VC for hand placement. Leans heavily over walker prior to assuming upright position.  Ambulation/Gait Ambulation/Gait assistance: Min assist Ambulation Distance (Feet): 65 Feet Assistive device: Rolling walker (2 wheeled) Gait Pattern/deviations: Step-to pattern;Step-through pattern;Decreased step length - right;Decreased stride length;Ataxic;Staggering left;Staggering right Gait velocity interpretation: Below normal speed for age/gender General Gait Details: Min assist to maintain balance and stability. Educated on safe DME use with rolling walker. Slightly ataxic with inconsistent foot placement. Pt very fatigued at end of distance with complaints of LLE cramps/spasms. VC for sequencing, especially with turns.    ADL: ADL Overall ADL's : Needs  assistance/impaired Grooming: Wash/dry hands;Oral care;Standing;Minimal assistance Lower Body Dressing: Moderate assistance;Sit to/from stand Toilet Transfer: Minimal assistance;Ambulation;Min guard;RW (bed) Functional mobility during ADLs: Maximal assistance;Rolling walker;Min guard;Minimal assistance General ADL Comments: Recommended pt wear left wrist splint she had in room to keep wrist in neutral position. Educated on safety tips for home (sitting for most of LB ADLs, safe shoewear, use of bag on walker). Pt ambulated to sink to perform some grooming tasks. Educated on AE for LB ADLs-pt tried using sockaid.  Assisted with donning left wrist splint.  Cognition: Cognition Overall Cognitive Status: Within Functional Limits for tasks assessed Orientation Level: Oriented X4 Cognition Arousal/Alertness: Awake/alert Behavior During Therapy: WFL for tasks assessed/performed Overall Cognitive Status: Within Functional Limits for tasks assessed  Physical Exam: Blood pressure 148/77, pulse 57, temperature 99.2 F (37.3 C), temperature source Oral, resp. rate 16, height '5\' 4"'  (1.626 m), weight 63.322 kg (139 lb 9.6 oz), SpO2 99.00%. Physical Exam Constitutional:  53 year old frail female  HENT: oral mucosa pink/moist Head: Normocephalic.  Eyes: EOM are normal.  Neck: Normal range of motion. Neck supple. No thyromegaly present.  Cardiovascular: Normal rate and regular rhythm.  Respiratory: Effort normal  and breath sounds normal. No respiratory distress.  GI: Soft. Bowel sounds are normal. She exhibits no distension.  Neurological:  Reflex Scores:  Tricep reflexes are 3+ on the right side and 3+ on the left side.  Bicep reflexes are 3+ on the right side and 3+ on the left side.  Brachioradialis reflexes are 3+ on the right side and 3+ on the left side.  Patellar reflexes are 3+ on the right side and 3+ on the left side.  Achilles reflexes are 4+ on the right side and 4+ on the left  side. RUE 5/5 in deltoid Bi, tri grip is 3 to 3+  LUE 3- delt, 4- Bi, tri, 0/5 L WE, FE, 2- FF  RLE 4/5 HF, KE, 2- ankle DF  LLE 4/5 HF KE, ADF  Mild facial diplegia  Clonus Bilateral ankles Intact PP/LT in all 4's.  Fair insight and awareness, perhaps a little impulsive Skin: bruise on left eye, a few other scattered bruises on the extremities. Musc: left wrist/hand splint in place, tight wrist Psych: patient pleasant and cooperative  Results for orders placed during the hospital encounter of 10/19/13 (from the past 48 hour(s))  CBC WITH DIFFERENTIAL     Status: Abnormal   Collection Time    10/19/13 12:48 PM      Result Value Ref Range   WBC 10.7 (*) 4.0 - 10.5 K/uL   RBC 3.92  3.87 - 5.11 MIL/uL   Hemoglobin 12.1  12.0 - 15.0 g/dL   HCT 35.8 (*) 36.0 - 46.0 %   MCV 91.3  78.0 - 100.0 fL   MCH 30.9  26.0 - 34.0 pg   MCHC 33.8  30.0 - 36.0 g/dL   RDW 12.3  11.5 - 15.5 %   Platelets 209  150 - 400 K/uL   Neutrophils Relative % 75  43 - 77 %   Neutro Abs 8.0 (*) 1.7 - 7.7 K/uL   Lymphocytes Relative 19  12 - 46 %   Lymphs Abs 2.0  0.7 - 4.0 K/uL   Monocytes Relative 6  3 - 12 %   Monocytes Absolute 0.6  0.1 - 1.0 K/uL   Eosinophils Relative 0  0 - 5 %   Eosinophils Absolute 0.0  0.0 - 0.7 K/uL   Basophils Relative 0  0 - 1 %   Basophils Absolute 0.0  0.0 - 0.1 K/uL  COMPREHENSIVE METABOLIC PANEL     Status: Abnormal   Collection Time    10/19/13 12:48 PM      Result Value Ref Range   Sodium 142  137 - 147 mEq/L   Potassium 3.7  3.7 - 5.3 mEq/L   Chloride 106  96 - 112 mEq/L   CO2 21  19 - 32 mEq/L   Glucose, Bld 99  70 - 99 mg/dL   BUN 20  6 - 23 mg/dL   Creatinine, Ser 1.27 (*) 0.50 - 1.10 mg/dL   Calcium 9.7  8.4 - 10.5 mg/dL   Total Protein 7.0  6.0 - 8.3 g/dL   Albumin 3.9  3.5 - 5.2 g/dL   AST 18  0 - 37 U/L   ALT 20  0 - 35 U/L   Alkaline Phosphatase 61  39 - 117 U/L   Total Bilirubin 0.5  0.3 - 1.2 mg/dL   GFR calc non Af Amer 48 (*) >90 mL/min   GFR calc  Af Amer 55 (*) >90 mL/min   Comment: (NOTE)  The eGFR has been calculated using the CKD EPI equation.     This calculation has not been validated in all clinical situations.     eGFR's persistently <90 mL/min signify possible Chronic Kidney     Disease.   Anion gap 15  5 - 15  LIPASE, BLOOD     Status: Abnormal   Collection Time    10/19/13 12:48 PM      Result Value Ref Range   Lipase 67 (*) 11 - 59 U/L  I-STAT TROPOININ, ED     Status: None   Collection Time    10/19/13  1:05 PM      Result Value Ref Range   Troponin i, poc 0.00  0.00 - 0.08 ng/mL   Comment 3            Comment: Due to the release kinetics of cTnI,     a negative result within the first hours     of the onset of symptoms does not rule out     myocardial infarction with certainty.     If myocardial infarction is still suspected,     repeat the test at appropriate intervals.  URINALYSIS, ROUTINE W REFLEX MICROSCOPIC     Status: Abnormal   Collection Time    10/19/13  2:46 PM      Result Value Ref Range   Color, Urine YELLOW  YELLOW   APPearance CLOUDY (*) CLEAR   Specific Gravity, Urine 1.023  1.005 - 1.030   pH 6.0  5.0 - 8.0   Glucose, UA NEGATIVE  NEGATIVE mg/dL   Hgb urine dipstick NEGATIVE  NEGATIVE   Bilirubin Urine NEGATIVE  NEGATIVE   Ketones, ur 15 (*) NEGATIVE mg/dL   Protein, ur NEGATIVE  NEGATIVE mg/dL   Urobilinogen, UA 0.2  0.0 - 1.0 mg/dL   Nitrite NEGATIVE  NEGATIVE   Leukocytes, UA SMALL (*) NEGATIVE  URINE MICROSCOPIC-ADD ON     Status: Abnormal   Collection Time    10/19/13  2:46 PM      Result Value Ref Range   Squamous Epithelial / LPF FEW (*) RARE   WBC, UA 0-2  <3 WBC/hpf   Bacteria, UA FEW (*) RARE  COMPREHENSIVE METABOLIC PANEL     Status: Abnormal   Collection Time    10/20/13  7:50 AM      Result Value Ref Range   Sodium 142  137 - 147 mEq/L   Potassium 3.1 (*) 3.7 - 5.3 mEq/L   Chloride 109  96 - 112 mEq/L   CO2 19  19 - 32 mEq/L   Glucose, Bld 71  70 - 99 mg/dL    BUN 18  6 - 23 mg/dL   Creatinine, Ser 1.15 (*) 0.50 - 1.10 mg/dL   Calcium 8.1 (*) 8.4 - 10.5 mg/dL   Total Protein 5.5 (*) 6.0 - 8.3 g/dL   Albumin 3.1 (*) 3.5 - 5.2 g/dL   AST 15  0 - 37 U/L   ALT 14  0 - 35 U/L   Alkaline Phosphatase 52  39 - 117 U/L   Total Bilirubin 0.4  0.3 - 1.2 mg/dL   GFR calc non Af Amer 54 (*) >90 mL/min   GFR calc Af Amer 62 (*) >90 mL/min   Comment: (NOTE)     The eGFR has been calculated using the CKD EPI equation.     This calculation has not been validated in all clinical situations.  eGFR's persistently <90 mL/min signify possible Chronic Kidney     Disease.   Anion gap 14  5 - 15  CBC     Status: Abnormal   Collection Time    10/20/13  7:50 AM      Result Value Ref Range   WBC 6.6  4.0 - 10.5 K/uL   RBC 3.36 (*) 3.87 - 5.11 MIL/uL   Hemoglobin 10.9 (*) 12.0 - 15.0 g/dL   HCT 31.7 (*) 36.0 - 46.0 %   MCV 94.3  78.0 - 100.0 fL   MCH 32.4  26.0 - 34.0 pg   MCHC 34.4  30.0 - 36.0 g/dL   RDW 12.4  11.5 - 15.5 %   Platelets 157  150 - 400 K/uL   Comment: REPEATED TO VERIFY  PROTIME-INR     Status: None   Collection Time    10/20/13  7:50 AM      Result Value Ref Range   Prothrombin Time 15.0  11.6 - 15.2 seconds   INR 1.18  0.00 - 1.49  LIPASE, BLOOD     Status: None   Collection Time    10/20/13  7:50 AM      Result Value Ref Range   Lipase 56  11 - 59 U/L  LACTIC ACID, PLASMA     Status: None   Collection Time    10/20/13  7:50 AM      Result Value Ref Range   Lactic Acid, Venous 0.6  0.5 - 2.2 mmol/L  MAGNESIUM     Status: Abnormal   Collection Time    10/20/13  7:50 AM      Result Value Ref Range   Magnesium 1.4 (*) 1.5 - 2.5 mg/dL   Ct Abdomen Pelvis Wo Contrast  10/19/2013   CLINICAL DATA:  Left-sided abdominal pain for 4 days  EXAM: CT ABDOMEN AND PELVIS WITHOUT CONTRAST  TECHNIQUE: Multidetector CT imaging of the abdomen and pelvis was performed following the standard protocol without IV contrast.  COMPARISON:  None.   FINDINGS: Lung bases clear except for 6 mm calcified nodule left lower lobe likely a benign granuloma. Bilateral breast implants noted.  Liver, gallbladder, spleen, pancreas, and adrenal glands normal. Right kidney not identified. Left kidney normal. Probable small splenule left upper quadrant.  Mild calcification of the aorta. Stomach and small bowel normal. Appendix normal. Mild diverticulosis distal descending colon, with large bowel otherwise negative.  Bladder and reproductive organs are normal. No acute musculoskeletal findings. No adenopathy or ascites.  IMPRESSION: No acute abnormalities.   Electronically Signed   By: Skipper Cliche M.D.   On: 10/19/2013 17:59   Dg Wrist Complete Left  10/20/2013   CLINICAL DATA:  LEFT wrist fracture with ORIF on 07/09/2013  EXAM: LEFT WRIST - COMPLETE 3+ VIEW  COMPARISON:  06/19/2013  FINDINGS: Osseous demineralization.  Volar plate and multiple screws identified at the distal LEFT radius post ORIF of the previously identified distal LEFT radial metaphyseal fracture.  Mild obliquity of positioning on lateral view limits assessment.  Nonunion of a previously identified ulnar styloid fracture fragment.  Joint spaces grossly preserved.  No additional fracture or dislocation.  IMPRESSION: Post ORIF of previously identified distal LEFT radial metaphyseal fracture.  Persistent visualization of ulnar styloid fracture fragment.  Osseous demineralization.   Electronically Signed   By: Lavonia Dana M.D.   On: 10/20/2013 12:10       Medical Problem List and Plan: 1. Functional deficits secondary to ALS  with spasticity, left upper extremity and distal right lower extremity. Additional deconditioning after medical issues above 2.  DVT Prophylaxis/Anticoagulation: Lovenox 40 mg daily. Monitor platelet counts and any signs of 3. Pain Management/chronic pain syndrome: Lyrica 150 mg 3 times a day, Methadone 5 mg every 8 hours as needed, oxycodone 10 mg every 4 hours as needed,  Valium 5 mg every 12 hours as needed 4. Mood/depression: Abilify 10 mg daily, Prozac 20 mg daily, Ativan 2 mg 3 times a day as needed. Provide emotional support 5. Neuropsych: This patient is capable of making decisions on her own behalf. 6. Skin/Wound Care: Routine skin care. Patient multiple bruising from falls 7. History of ileus with nonspecific abdominal pain. Continue Bentyl 10 mg 3 times a day as well as protonix twice a day 8. Left wrist fracture April 2015. Continue splint conservative care/weightbearing as tolerated 9. Nausea/GERD---pt to bring in own supply of nexium from home----will dose BID.  -rx nausea prn,   -will work on improved po intake as well, ?megace trial    Post Admission Physician Evaluation: 1. Functional deficits secondary  to ALS and deconditioning. 2. Patient is admitted to receive collaborative, interdisciplinary care between the physiatrist, rehab nursing staff, and therapy team. 3. Patient's level of medical complexity and substantial therapy needs in context of that medical necessity cannot be provided at a lesser intensity of care such as a SNF. 4. Patient has experienced substantial functional loss from his/her baseline which was documented above under the "Functional History" and "Functional Status" headings.  Judging by the patient's diagnosis, physical exam, and functional history, the patient has potential for functional progress which will result in measurable gains while on inpatient rehab.  These gains will be of substantial and practical use upon discharge  in facilitating mobility and self-care at the household level. 5. Physiatrist will provide 24 hour management of medical needs as well as oversight of the therapy plan/treatment and provide guidance as appropriate regarding the interaction of the two. 6. 24 hour rehab nursing will assist with bladder management, bowel management, safety, skin/wound care, disease management, medication administration,  pain management and patient education  and help integrate therapy concepts, techniques,education, etc. 7. PT will assess and treat for/with: Lower extremity strength, range of motion, stamina, balance, functional mobility, safety, adaptive techniques and equipment, NMR, adaptive equipment, orthotics, family/pt education.   Goals are: supervision to mod I. 8. OT will assess and treat for/with: ADL's, functional mobility, safety, upper extremity strength, adaptive techniques and equipment, NMR, adaptive equipment, pain control, orthotics, education.   Goals are: mod i to supervision. 9. SLP will assess and treat for/with: n/a.  Goals are: n/a. 10. Case Management and Social Worker will assess and treat for psychological issues and discharge planning. 11. Team conference will be held weekly to assess progress toward goals and to determine barriers to discharge. 12. Patient will receive at least 3 hours of therapy per day at least 5 days per week. 13. ELOS: 8-13 days       14. Prognosis:  good  Meredith Staggers, MD, [facility]       10/21/2013

## 2013-10-21 NOTE — Progress Notes (Signed)
Patient arrived onto inpatient rehab room [phone] via wheelchair with nurse tech assistance, with husband accompanying. Patient oriented to room, and given information packet. Will continue to monitor.

## 2013-10-21 NOTE — Consult Note (Signed)
Physical Medicine and Rehabilitation Consult Reason for Consult: Deconditioning/ALS Referring Physician: [facility]   HPI: Katherine Potts is a 53 y.o. 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 Anne Hahn. Patient lives with her 31 year old son and assistance as needed. She used a walker and needed assistance for activities of daily living prior to admission. Admitted 10/19/2013 with abdominal pain with nausea, diarrhea, vomiting as well as generalized weakness and falls. Patient with progressive decline over the last few months. CT of abdomen and pelvis negative. Subcutaneous Lovenox added for DVT prophylaxis. Diet slowly advanced to regular consistency. Noted pronounced deconditioning with limited mobility with physical therapy evaluation completed a 25 2015 with recommendations of physical medicine rehabilitation consult.  Pt states she was diagnosed with ALS in May of this year however neuro notes indicate diagnosis in 12/2012, goes to [facility] and sees local neurologist Dr Anne Hahn as well  Review of Systems  Cardiovascular: Positive for palpitations.  Gastrointestinal: Positive for constipation.       GERD  Musculoskeletal: Positive for back pain, falls and myalgias.  Psychiatric/Behavioral: Positive for depression.       Anxiety  All other systems reviewed and are negative.  Past Medical History  Diagnosis Date  . GERD (gastroesophageal reflux disease)   . Anxiety   . Depression   . MVP (mitral valve prolapse)   . Cervical spondylosis   . 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 Pneumonia   . Constipation    Past Surgical History  Procedure Laterality Date  . Eye surgery    . Breast surgery    . Vagina surgery      mesh  . Kidney donation      Right nephrectomy  . Abdominal hernia repair    . Orif wrist  fracture  07/09/2013    DR Melvyn Novas  . Orif wrist fracture Left 07/09/2013    Procedure: OPEN REDUCTION INTERNAL FIXATION (ORIF) LEFT  WRIST FRACTURE;  Surgeon: Sharma Covert, MD;  Location: [facility] OR;  Service: Orthopedics;  Laterality: Left;   Family History  Problem Relation Age of Onset  . Bipolar disorder Brother   . Dementia Mother   . Hypertension Father   . ALS Cousin    Social History:  reports that she has quit smoking. Her smoking use included Cigarettes and Cigars. She smoked 0.40 packs per day. She has never used smokeless tobacco. She reports that she uses illicit drugs (Marijuana). She reports that she does not drink alcohol. Allergies:  Allergies  Allergen Reactions  . Bactrim [Sulfamethoxazole-Trimethoprim] Nausea Only  . Ciprofloxacin Nausea And Vomiting   Medications Prior to Admission  Medication Sig Dispense Refill  . ARIPiprazole (ABILIFY) 2 MG tablet Take 2 mg by mouth daily.      . calcium carbonate (TUMS - DOSED IN MG ELEMENTAL CALCIUM) 500 MG chewable tablet Chew 1 tablet by mouth daily.      Marland Kitchen dexamethasone (DECADRON) 2 MG tablet Take 2 mg by mouth daily.      . diazepam (VALIUM) 5 MG tablet Take 1 tablet (5 mg total) by mouth every 12 (twelve) hours as needed for muscle spasms.  60 tablet  1  . esomeprazole (NEXIUM) 40 MG capsule Take 40 mg by mouth daily before breakfast.      . FLUoxetine (PROZAC) 20 MG capsule Take 20 mg by  mouth daily.      Marland Kitchen LORazepam (ATIVAN) 1 MG tablet Take 2 mg by mouth 3 (three) times daily as needed for anxiety.      Marland Kitchen LYRICA 150 MG capsule Take 150 mg by mouth 3 (three) times daily.      . methadone (DOLOPHINE) 5 MG tablet Take 5 mg by mouth every 8 (eight) hours as needed for severe pain.       Marland Kitchen ondansetron (ZOFRAN) 4 MG tablet Take 1 tablet (4 mg total) by mouth every 6 (six) hours.  12 tablet  0  . promethazine (PHENERGAN) 25 MG tablet Take 25 mg by mouth every 6 (six) hours as needed for nausea or vomiting.      . vitamin C  (ASCORBIC ACID) 500 MG tablet Take 1 tablet (500 mg total) by mouth daily.  50 tablet  0    Home: Home Living Family/patient expects to be discharged to:: Private residence Living Arrangements: Children Available Help at Discharge: Family;Available 24 hours/day Type of Home: House Home Access: Ramped entrance Home Layout: One level Home Equipment: Walker - 2 wheels;Bedside commode;Transport chair;Shower seat;Adaptive equipment Adaptive Equipment: Feeding equipment  Functional History: Prior Function Level of Independence: Needs assistance Gait / Transfers Assistance Needed: Use of RW with assistance for ambulation - could ambulate in community ADL's / Homemaking Assistance Needed: Bath and dress independently Functional Status:  Mobility: Bed Mobility Overal bed mobility: Needs Assistance Bed Mobility: Supine to Sit;Sit to Supine Supine to sit: Min assist Sit to supine: Supervision General bed mobility comments: assist with trunk to sit EOB. Cues for technique. Transfers Overall transfer level: Needs assistance Equipment used: Rolling walker (2 wheeled) Transfers: Sit to/from Stand Sit to Stand: Min guard General transfer comment: Min assist for boost from lowest bed setting, Mod assist to safely control descent into reclining chair. VC for hand placement. Leans heavily over walker prior to assuming upright position.  Ambulation/Gait Ambulation/Gait assistance: Min assist Ambulation Distance (Feet): 65 Feet Assistive device: Rolling walker (2 wheeled) Gait Pattern/deviations: Step-to pattern;Step-through pattern;Decreased step length - right;Decreased stride length;Ataxic;Staggering left;Staggering right Gait velocity interpretation: Below normal speed for age/gender General Gait Details: Min assist to maintain balance and stability. Educated on safe DME use with rolling walker. Slightly ataxic with inconsistent foot placement. Pt very fatigued at end of distance with complaints  of LLE cramps/spasms. VC for sequencing, especially with turns.    ADL: ADL Overall ADL's : Needs assistance/impaired Grooming: Wash/dry hands;Oral care;Standing;Minimal assistance Lower Body Dressing: Moderate assistance;Sit to/from stand Toilet Transfer: Minimal assistance;Ambulation;Min guard;RW (bed) Functional mobility during ADLs: Maximal assistance;Rolling walker;Min guard;Minimal assistance General ADL Comments: Recommended pt wear left wrist splint she had in room to keep wrist in neutral position. Educated on safety tips for home (sitting for most of LB ADLs, safe shoewear, use of bag on walker). Pt ambulated to sink to perform some grooming tasks. Educated on AE for LB ADLs-pt tried using sockaid.  Assisted with donning left wrist splint.  Cognition: Cognition Overall Cognitive Status: Within Functional Limits for tasks assessed Orientation Level: Oriented X4 Cognition Arousal/Alertness: Awake/alert Behavior During Therapy: WFL for tasks assessed/performed Overall Cognitive Status: Within Functional Limits for tasks assessed  Blood pressure 148/77, pulse 57, temperature 99.2 F (37.3 C), temperature source Oral, resp. rate 16, height  (1.626 m), weight 63.322 kg (139 lb 9.6 oz), SpO2 99.00%. Physical Exam  Vitals reviewed. Constitutional:  53 year old for frail female  HENT:  Head: Normocephalic.  Eyes: EOM are normal.  Neck: Normal range of motion. Neck supple. No thyromegaly present.  Cardiovascular: Normal rate and regular rhythm.   Respiratory: Effort normal and breath sounds normal. No respiratory distress.  GI: Soft. Bowel sounds are normal. She exhibits no distension.  Neurological:  Reflex Scores:      Tricep reflexes are 3+ on the right side and 3+ on the left side.      Bicep reflexes are 3+ on the right side and 3+ on the left side.      Brachioradialis reflexes are 3+ on the right side and 3+ on the left side.      Patellar reflexes are 3+ on the  right side and 3+ on the left side.      Achilles reflexes are 4+ on the right side and 4+ on the left side. Patient is alert and anxious. She is oriented x3 and follows full commands  Skin:  Left wrist with splint in place  RUE 5/5 in deltoid Bi, tri grip LUE 3- delt, 4- Bi, tri, 0/5 L WE, FE, 2- FF RLE 4/5 HF, KE, 2- ankle DF LLE 4/5 HF KE, ADF Mild facial diplegia Clonus Bilateral ankles  Results for orders placed during the hospital encounter of 10/19/13 (from the past 24 hour(s))  COMPREHENSIVE METABOLIC PANEL     Status: Abnormal   Collection Time    10/20/13  7:50 AM      Result Value Ref Range   Sodium 142  137 - 147 mEq/L   Potassium 3.1 (*) 3.7 - 5.3 mEq/L   Chloride 109  96 - 112 mEq/L   CO2 19  19 - 32 mEq/L   Glucose, Bld 71  70 - 99 mg/dL   BUN 18  6 - 23 mg/dL   Creatinine, Ser 1.61 (*) 0.50 - 1.10 mg/dL   Calcium 8.1 (*) 8.4 - 10.5 mg/dL   Total Protein 5.5 (*) 6.0 - 8.3 g/dL   Albumin 3.1 (*) 3.5 - 5.2 g/dL   AST 15  0 - 37 U/L   ALT 14  0 - 35 U/L   Alkaline Phosphatase 52  39 - 117 U/L   Total Bilirubin 0.4  0.3 - 1.2 mg/dL   GFR calc non Af Amer 54 (*) >90 mL/min   GFR calc Af Amer 62 (*) >90 mL/min   Anion gap 14  5 - 15  CBC     Status: Abnormal   Collection Time    10/20/13  7:50 AM      Result Value Ref Range   WBC 6.6  4.0 - 10.5 K/uL   RBC 3.36 (*) 3.87 - 5.11 MIL/uL   Hemoglobin 10.9 (*) 12.0 - 15.0 g/dL   HCT 09.6 (*) 04.5 - 40.9 %   MCV 94.3  78.0 - 100.0 fL   MCH 32.4  26.0 - 34.0 pg   MCHC 34.4  30.0 - 36.0 g/dL   RDW 81.1  91.4 - 78.2 %   Platelets 157  150 - 400 K/uL  PROTIME-INR     Status: None   Collection Time    10/20/13  7:50 AM      Result Value Ref Range   Prothrombin Time 15.0  11.6 - 15.2 seconds   INR 1.18  0.00 - 1.49  LIPASE, BLOOD     Status: None   Collection Time    10/20/13  7:50 AM      Result Value Ref Range   Lipase 56  11 - 59  U/L  LACTIC ACID, PLASMA     Status: None   Collection Time    10/20/13  7:50  AM      Result Value Ref Range   Lactic Acid, Venous 0.6  0.5 - 2.2 mmol/L  MAGNESIUM     Status: Abnormal   Collection Time    10/20/13  7:50 AM      Result Value Ref Range   Magnesium 1.4 (*) 1.5 - 2.5 mg/dL   Ct Abdomen Pelvis Wo Contrast  10/19/2013   CLINICAL DATA:  Left-sided abdominal pain for 4 days  EXAM: CT ABDOMEN AND PELVIS WITHOUT CONTRAST  TECHNIQUE: Multidetector CT imaging of the abdomen and pelvis was performed following the standard protocol without IV contrast.  COMPARISON:  None.  FINDINGS: Lung bases clear except for 6 mm calcified nodule left lower lobe likely a benign granuloma. Bilateral breast implants noted.  Liver, gallbladder, spleen, pancreas, and adrenal glands normal. Right kidney not identified. Left kidney normal. Probable small splenule left upper quadrant.  Mild calcification of the aorta. Stomach and small bowel normal. Appendix normal. Mild diverticulosis distal descending colon, with large bowel otherwise negative.  Bladder and reproductive organs are normal. No acute musculoskeletal findings. No adenopathy or ascites.  IMPRESSION: No acute abnormalities.   Electronically Signed   By: Esperanza Heir M.D.   On: 10/19/2013 17:59   Dg Wrist Complete Left  10/20/2013   CLINICAL DATA:  LEFT wrist fracture with ORIF on 07/09/2013  EXAM: LEFT WRIST - COMPLETE 3+ VIEW  COMPARISON:  06/19/2013  FINDINGS: Osseous demineralization.  Volar plate and multiple screws identified at the distal LEFT radius post ORIF of the previously identified distal LEFT radial metaphyseal fracture.  Mild obliquity of positioning on lateral view limits assessment.  Nonunion of a previously identified ulnar styloid fracture fragment.  Joint spaces grossly preserved.  No additional fracture or dislocation.  IMPRESSION: Post ORIF of previously identified distal LEFT radial metaphyseal fracture.  Persistent visualization of ulnar styloid fracture fragment.  Osseous demineralization.   Electronically  Signed   By: Ulyses Southward M.D.   On: 10/20/2013 12:10    Assessment/Plan: Diagnosis: ALS with spasticity, LUE and distal RLE weakness, no sig bulbar dysfunction thurs far 1. Does the need for close, 24 hr/day medical supervision in concert with the patient's rehab needs make it unreasonable for this patient to be served in a less intensive setting? Yes 2. Co-Morbidities requiring supervision/potential complications: GERD, Depression, palliative issues 3. Due to bowel management, safety, skin/wound care, disease management, medication administration, pain management and patient education, does the patient require 24 hr/day rehab nursing? Yes 4. Does the patient require coordinated care of a physician, rehab nurse, PT (1-2 hrs/day, 5 days/week) and OT (1-2 hrs/day, 5 days/week) to address physical and functional deficits in the context of the above medical diagnosis(es)? Yes Addressing deficits in the following areas: balance, endurance, locomotion, strength, transferring, bathing, dressing, feeding, grooming and toileting 5. Can the patient actively participate in an intensive therapy program of at least 3 hrs of therapy per day at least 5 days per week? Yes 6. The potential for patient to make measurable gains while on inpatient rehab is good 7. Anticipated functional outcomes upon discharge from inpatient rehab are modified independent and supervision  with PT, modified independent and supervision with OT, n/a with SLP. 8. Estimated rehab length of stay to reach the above functional goals is: 7-10d 9. Does the patient have adequate social supports to accommodate these discharge functional  goals? Yes 10. Anticipated D/C setting: Home 11. Anticipated post D/C treatments: HH therapy 12. Overall Rehab/Functional Prognosis: good  RECOMMENDATIONS: This patient's condition is appropriate for continued rehabilitative care in the following setting: CIR Patient has agreed to participate in recommended  program. Yes Note that insurance prior authorization may be required for reimbursement for recommended care.  Comment: Will need Right AFO    10/21/2013

## 2013-10-21 NOTE — Progress Notes (Signed)
Burman Riis, MD Physician Signed Physical Medicine and Rehabilitation Consult Note Service date: 10/21/2013 5:49 AM  Related encounter: Admission (Current) from 10/19/2013 in [facility] ORTH[facility]           Physical Medicine and Rehabilitation Consult Reason for Consult: Deconditioning/ALS Referring Physician: [facility]     HPI: Katherine Potts is a 53 y.o. 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 Anne Hahn. Patient lives with her 67 year old son and assistance as needed. She used a walker and needed assistance for activities of daily living prior to admission. Admitted 10/19/2013 with abdominal pain with nausea, diarrhea, vomiting as well as generalized weakness and falls. Patient with progressive decline over the last few months. CT of abdomen and pelvis negative. Subcutaneous Lovenox added for DVT prophylaxis. Diet slowly advanced to regular consistency. Noted pronounced deconditioning with limited mobility with physical therapy evaluation completed a 25 2015 with recommendations of physical medicine rehabilitation consult.   Pt states she was diagnosed with ALS in May of this year however neuro notes indicate diagnosis in 12/2012, goes to [facility] and sees local neurologist Dr Anne Hahn as well   Review of Systems  Cardiovascular: Positive for palpitations.  Gastrointestinal: Positive for constipation.        GERD  Musculoskeletal: Positive for back pain, falls and myalgias.  Psychiatric/Behavioral: Positive for depression.        Anxiety  All other systems reviewed and are negative. Past Medical History   Diagnosis  Date   .  GERD (gastroesophageal reflux disease)     .  Anxiety     .  Depression     .  MVP (mitral valve prolapse)     .  Cervical spondylosis     .  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  Pneumonia     .  Constipation      Past Surgical History   Procedure  Laterality  Date   .  Eye surgery       .  Breast surgery       .  Vagina surgery           mesh   .  Kidney donation           Right nephrectomy   .  Abdominal hernia repair       .  Orif wrist fracture    07/09/2013       DR Melvyn Novas   .  Orif wrist fracture  Left  07/09/2013       Procedure: OPEN REDUCTION INTERNAL FIXATION (ORIF) LEFT  WRIST FRACTURE;  Surgeon: Sharma Covert, MD;  Location: [facility] OR;  Service: Orthopedics;  Laterality: Left;    Family History   Problem  Relation  Age of Onset   .  Bipolar disorder  Brother     .  Dementia  Mother     .  Hypertension  Father     .  ALS  Cousin      Social History: reports that she has quit smoking. Her smoking use included Cigarettes and Cigars. She smoked 0.40 packs per day. She has never used smokeless tobacco. She reports that she uses illicit drugs (Marijuana). She reports that she does not drink alcohol. Allergies:   Allergies   Allergen  Reactions   .  Bactrim [Sulfamethoxazole-Trimethoprim]  Nausea Only   .  Ciprofloxacin  Nausea And Vomiting    Medications Prior to Admission   Medication  Sig  Dispense  Refill   .  ARIPiprazole (ABILIFY) 2 MG tablet  Take 2 mg by mouth daily.         .  calcium carbonate (TUMS - DOSED IN MG ELEMENTAL CALCIUM) 500 MG chewable tablet  Chew 1 tablet by mouth daily.         Marland Kitchen  dexamethasone (DECADRON) 2 MG tablet  Take 2 mg by mouth daily.         .  diazepam (VALIUM) 5 MG tablet  Take 1 tablet (5 mg total) by mouth every 12 (twelve) hours as needed for muscle spasms.   60 tablet   1   .  esomeprazole (NEXIUM) 40 MG capsule  Take 40 mg by mouth daily before breakfast.         .  FLUoxetine (PROZAC) 20 MG capsule  Take 20 mg by mouth daily.         Marland Kitchen  LORazepam (ATIVAN) 1 MG tablet  Take 2 mg by mouth 3 (three) times daily as needed for anxiety.         Marland Kitchen  LYRICA 150 MG capsule  Take 150 mg by mouth 3  (three) times daily.         .  methadone (DOLOPHINE) 5 MG tablet  Take 5 mg by mouth every 8 (eight) hours as needed for severe pain.          Marland Kitchen  ondansetron (ZOFRAN) 4 MG tablet  Take 1 tablet (4 mg total) by mouth every 6 (six) hours.   12 tablet   0   .  promethazine (PHENERGAN) 25 MG tablet  Take 25 mg by mouth every 6 (six) hours as needed for nausea or vomiting.         .  vitamin C (ASCORBIC ACID) 500 MG tablet  Take 1 tablet (500 mg total) by mouth daily.   50 tablet   0      Home: Home Living Family/patient expects to be discharged to:: Private residence Living Arrangements: Children Available Help at Discharge: Family;Available 24 hours/day Type of Home: House Home Access: Ramped entrance Home Layout: One level Home Equipment: Walker - 2 wheels;Bedside commode;Transport chair;Shower seat;Adaptive equipment Adaptive Equipment: Feeding equipment   Functional History: Prior Function Level of Independence: Needs assistance Gait / Transfers Assistance Needed: Use of RW with assistance for ambulation - could ambulate in community ADL's / Homemaking Assistance Needed: Bath and dress independently Functional Status:   Mobility: Bed Mobility Overal bed mobility: Needs Assistance Bed Mobility: Supine to Sit;Sit to Supine Supine to sit: Min assist Sit to supine: Supervision General bed mobility comments: assist with trunk to sit EOB. Cues for technique. Transfers Overall transfer level: Needs assistance Equipment used: Rolling walker (2 wheeled) Transfers: Sit to/from Stand Sit to Stand: Min guard General transfer comment: Min assist for boost from lowest bed setting, Mod assist to safely control descent into reclining chair. VC for hand placement. Leans heavily over walker prior to assuming upright position.  Ambulation/Gait Ambulation/Gait assistance: Min assist Ambulation Distance (Feet): 65 Feet Assistive device: Rolling walker (2 wheeled) Gait Pattern/deviations: Step-to  pattern;Step-through pattern;Decreased step length - right;Decreased stride length;Ataxic;Staggering left;Staggering right Gait velocity interpretation: Below normal speed for age/gender General Gait Details: Min assist to maintain balance and stability. Educated on safe DME use with rolling walker. Slightly ataxic with inconsistent foot  placement. Pt very fatigued at end of distance with complaints of LLE cramps/spasms. VC for sequencing, especially with turns.   ADL: ADL Overall ADL's : Needs assistance/impaired Grooming: Wash/dry hands;Oral care;Standing;Minimal assistance Lower Body Dressing: Moderate assistance;Sit to/from stand Toilet Transfer: Minimal assistance;Ambulation;Min guard;RW (bed) Functional mobility during ADLs: Maximal assistance;Rolling walker;Min guard;Minimal assistance General ADL Comments: Recommended pt wear left wrist splint she had in room to keep wrist in neutral position. Educated on safety tips for home (sitting for most of LB ADLs, safe shoewear, use of bag on walker). Pt ambulated to sink to perform some grooming tasks. Educated on AE for LB ADLs-pt tried using sockaid.  Assisted with donning left wrist splint.   Cognition: Cognition Overall Cognitive Status: Within Functional Limits for tasks assessed Orientation Level: Oriented X4 Cognition Arousal/Alertness: Awake/alert Behavior During Therapy: WFL for tasks assessed/performed Overall Cognitive Status: Within Functional Limits for tasks assessed   Blood pressure 148/77, pulse 57, temperature 99.2 F (37.3 C), temperature source Oral, resp. rate 16, height  (1.626 m), weight 63.322 kg (139 lb 9.6 oz), SpO2 99.00%. Physical Exam  Vitals reviewed. Constitutional:  53 year old for frail female  HENT:   Head: Normocephalic.  Eyes: EOM are normal.  Neck: Normal range of motion. Neck supple. No thyromegaly present.  Cardiovascular: Normal rate and regular rhythm.   Respiratory: Effort normal and  breath sounds normal. No respiratory distress.  GI: Soft. Bowel sounds are normal. She exhibits no distension.  Neurological:   Reflex Scores:      Tricep reflexes are 3+ on the right side and 3+ on the left side.      Bicep reflexes are 3+ on the right side and 3+ on the left side.      Brachioradialis reflexes are 3+ on the right side and 3+ on the left side.      Patellar reflexes are 3+ on the right side and 3+ on the left side.      Achilles reflexes are 4+ on the right side and 4+ on the left side. Patient is alert and anxious. She is oriented x3 and follows full commands  Skin:  Left wrist with splint in place  RUE 5/5 in deltoid Bi, tri grip LUE 3- delt, 4- Bi, tri, 0/5 L WE, FE, 2- FF RLE 4/5 HF, KE, 2- ankle DF LLE 4/5 HF KE, ADF Mild facial diplegia Clonus Bilateral ankles    Results for orders placed during the hospital encounter of 10/19/13 (from the past 24 hour(s))   COMPREHENSIVE METABOLIC PANEL     Status: Abnormal     Collection Time      10/20/13  7:50 AM       Result  Value  Ref Range     Sodium  142   137 - 147 mEq/L     Potassium  3.1 (*)  3.7 - 5.3 mEq/L     Chloride  109   96 - 112 mEq/L     CO2  19   19 - 32 mEq/L     Glucose, Bld  71   70 - 99 mg/dL     BUN  18   6 - 23 mg/dL     Creatinine, Ser  1.61 (*)  0.50 - 1.10 mg/dL     Calcium  8.1 (*)  8.4 - 10.5 mg/dL     Total Protein  5.5 (*)  6.0 - 8.3 g/dL     Albumin  3.1 (*)  3.5 -  5.2 g/dL     AST  15   0 - 37 U/L     ALT  14   0 - 35 U/L     Alkaline Phosphatase  52   39 - 117 U/L     Total Bilirubin  0.4   0.3 - 1.2 mg/dL     GFR calc non Af Amer  54 (*)  >90 mL/min     GFR calc Af Amer  62 (*)  >90 mL/min     Anion gap  14   5 - 15   CBC     Status: Abnormal     Collection Time      10/20/13  7:50 AM       Result  Value  Ref Range     WBC  6.6   4.0 - 10.5 K/uL     RBC  3.36 (*)  3.87 - 5.11 MIL/uL     Hemoglobin  10.9 (*)  12.0 - 15.0 g/dL     HCT  16.1 (*)  09.6 - 46.0 %     MCV  94.3    78.0 - 100.0 fL     MCH  32.4   26.0 - 34.0 pg     MCHC  34.4   30.0 - 36.0 g/dL     RDW  04.5   40.9 - 15.5 %     Platelets  157   150 - 400 K/uL   PROTIME-INR     Status: None     Collection Time      10/20/13  7:50 AM       Result  Value  Ref Range     Prothrombin Time  15.0   11.6 - 15.2 seconds     INR  1.18   0.00 - 1.49   LIPASE, BLOOD     Status: None     Collection Time      10/20/13  7:50 AM       Result  Value  Ref Range     Lipase  56   11 - 59 U/L   LACTIC ACID, PLASMA     Status: None     Collection Time      10/20/13  7:50 AM       Result  Value  Ref Range     Lactic Acid, Venous  0.6   0.5 - 2.2 mmol/L   MAGNESIUM     Status: Abnormal     Collection Time      10/20/13  7:50 AM       Result  Value  Ref Range     Magnesium  1.4 (*)  1.5 - 2.5 mg/dL    Ct Abdomen Pelvis Wo Contrast   10/19/2013   CLINICAL DATA:  Left-sided abdominal pain for 4 days  EXAM: CT ABDOMEN AND PELVIS WITHOUT CONTRAST  TECHNIQUE: Multidetector CT imaging of the abdomen and pelvis was performed following the standard protocol without IV contrast.  COMPARISON:  None.  FINDINGS: Lung bases clear except for 6 mm calcified nodule left lower lobe likely a benign granuloma. Bilateral breast implants noted.  Liver, gallbladder, spleen, pancreas, and adrenal glands normal. Right kidney not identified. Left kidney normal. Probable small splenule left upper quadrant.  Mild calcification of the aorta. Stomach and small bowel normal. Appendix normal. Mild diverticulosis distal descending colon, with large bowel otherwise negative.  Bladder and reproductive organs are normal. No acute musculoskeletal findings. No adenopathy or  ascites.  IMPRESSION: No acute abnormalities.   Electronically Signed   By: Esperanza Heir M.D.   On: 10/19/2013 17:59    Dg Wrist Complete Left   10/20/2013   CLINICAL DATA:  LEFT wrist fracture with ORIF on 07/09/2013  EXAM: LEFT WRIST - COMPLETE 3+ VIEW  COMPARISON:  06/19/2013   FINDINGS: Osseous demineralization.  Volar plate and multiple screws identified at the distal LEFT radius post ORIF of the previously identified distal LEFT radial metaphyseal fracture.  Mild obliquity of positioning on lateral view limits assessment.  Nonunion of a previously identified ulnar styloid fracture fragment.  Joint spaces grossly preserved.  No additional fracture or dislocation.  IMPRESSION: Post ORIF of previously identified distal LEFT radial metaphyseal fracture.  Persistent visualization of ulnar styloid fracture fragment.  Osseous demineralization.   Electronically Signed   By: Ulyses Southward M.D.   On: 10/20/2013 12:10     Assessment/Plan: Diagnosis: ALS with spasticity, LUE and distal RLE weakness, no sig bulbar dysfunction thurs far Does the need for close, 24 hr/day medical supervision in concert with the patient's rehab needs make it unreasonable for this patient to be served in a less intensive setting? Yes Co-Morbidities requiring supervision/potential complications: GERD, Depression, palliative issues Due to bowel management, safety, skin/wound care, disease management, medication administration, pain management and patient education, does the patient require 24 hr/day rehab nursing? Yes Does the patient require coordinated care of a physician, rehab nurse, PT (1-2 hrs/day, 5 days/week) and OT (1-2 hrs/day, 5 days/week) to address physical and functional deficits in the context of the above medical diagnosis(es)? Yes Addressing deficits in the following areas: balance, endurance, locomotion, strength, transferring, bathing, dressing, feeding, grooming and toileting Can the patient actively participate in an intensive therapy program of at least 3 hrs of therapy per day at least 5 days per week? Yes The potential for patient to make measurable gains while on inpatient rehab is good Anticipated functional outcomes upon discharge from inpatient rehab are modified independent and  supervision  with PT, modified independent and supervision with OT, n/a with SLP. Estimated rehab length of stay to reach the above functional goals is: 7-10d Does the patient have adequate social supports to accommodate these discharge functional goals? Yes Anticipated D/C setting: Home Anticipated post D/C treatments: HH therapy Overall Rehab/Functional Prognosis: good   RECOMMENDATIONS: This patient's condition is appropriate for continued rehabilitative care in the following setting: CIR Patient has agreed to participate in recommended program. Yes Note that insurance prior authorization may be required for reimbursement for recommended care.   Comment: Will need Right AFO       10/21/2013

## 2013-10-21 NOTE — PMR Pre-admission (Signed)
PMR Admission Coordinator Pre-Admission Assessment  Patient: Katherine Potts is an 53 y.o., female MRN: [id number] DOB: 06/07/1960 Height:  (162.6 cm) Weight: 63.322 kg (139 lb 9.6 oz)              Insurance Information HMO:   PPO:      PCP:      IPA:      80/20:      OTHER:  PRIMARY:   Medicare A and B      Policy#: [id number]      Subscriber:  self CM Name:       Phone#:      Fax#:  Pre-Cert#:       Employer: disabled Benefits:  Phone #:      Name:  Eff. Date: 02/26/13     Deduct:  $1260      Out of Pocket Max:  none      Life Max:  none CIR:  100%      SNF:  100% first 20 days per Medicare guidelines Outpatient:  80%     Co-Pay:  20% Home Health:  100% per Medicare guidelines      Co-Pay:  no DME:   80%  Co-Pay:  20% Providers:  Pt. choice SECONDARY:  BCBS [place] PPO      Policy#: [id number]  Subscriber:  self       Phone#: [phone]         Emergency Contact Information Contact Information   Name Relation Home Work Mobile   [place] Friend [phone]     Katherine Potts [phone]     Katherine Potts   [phone]     Current Medical History  Patient Admitting Diagnosis: ALS with spasticity, LUE and distal RLE weakness, no sign bulbar dysfunction thus far  History of Present Illness: Katherine Potts is a 53 y.o. 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 Anne Hahn. Patient lives with her 78 year old son and assistance as needed. She used a walker and needed assistance for activities of daily living prior to admission. Admitted 10/19/2013 with abdominal pain with nausea, diarrhea, vomiting as well as generalized weakness and falls. Patient with progressive decline over the last few months. CT of abdomen and pelvis negative. Subcutaneous Lovenox added for DVT prophylaxis. Diet slowly advanced to regular consistency. Noted  pronounced deconditioning with limited mobility with physical therapy evaluation completed on  25 2015 with recommendations of physical medicine rehabilitation consult. Pt. admttted to IP rehab on 10/21/13. Pt states she was diagnosed with ALS in May of this year however neuro notes indicate diagnosis in 12/2012, goes to [facility] and sees local neurologist Dr Anne Hahn as well       Past Medical History  Past Medical History  Diagnosis Date  . GERD (gastroesophageal reflux disease)   . Anxiety   . Depression   . MVP (mitral valve prolapse)   . Cervical spondylosis   . 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 Pneumonia   . Constipation     Family History  family history includes ALS in her cousin; Bipolar disorder in her brother; Dementia in her mother; Hypertension in her father.  Prior Rehab/Hospitalizations: no IP rehabilitation in the past   Current Medications  Current facility-administered medications:0.9 % NaCl with KCl 40 mEq / L  infusion, , Intravenous, Continuous, Richarda Overlie, MD, Last Rate: 75  mL/hr at 10/20/13 1534, 75 mL/hr at 10/20/13 1534;  acetaminophen (TYLENOL) suppository 650 mg, 650 mg, Rectal, Q6H PRN, Lynden Oxford, MD;  acetaminophen (TYLENOL) tablet 650 mg, 650 mg, Oral, Q6H PRN, Lynden Oxford, MD ARIPiprazole (ABILIFY) tablet 2 mg, 2 mg, Oral, Daily, Lynden Oxford, MD, 2 mg at 10/21/13 1610;  dexamethasone (DECADRON) tablet 2 mg, 2 mg, Oral, Daily, Lynden Oxford, MD, 2 mg at 10/21/13 9604;  diazepam (VALIUM) tablet 5 mg, 5 mg, Oral, Q12H PRN, Lynden Oxford, MD, 5 mg at 10/21/13 0131;  dicyclomine (BENTYL) capsule 10 mg, 10 mg, Oral, TID AC, Ripudeep K Rai, MD, 10 mg at 10/21/13 1140 enoxaparin (LOVENOX) injection 40 mg, 40 mg, Subcutaneous, Q24H, Lynden Oxford, MD, 40 mg at 10/21/13 1140;  FLUoxetine (PROZAC) capsule 20 mg, 20 mg, Oral, Daily, Lynden Oxford, MD, 20 mg at 10/21/13 5409;  LORazepam (ATIVAN) tablet 2 mg, 2 mg, Oral, TID PRN,  Lynden Oxford, MD, 2 mg at 10/21/13 8119;  methadone (DOLOPHINE) tablet 5 mg, 5 mg, Oral, Q8H PRN, Lynden Oxford, MD, 5 mg at 10/21/13 1106 ondansetron (ZOFRAN) injection 4 mg, 4 mg, Intravenous, Q6H PRN, Lynden Oxford, MD, 4 mg at 10/20/13 1700;  ondansetron (ZOFRAN) tablet 4 mg, 4 mg, Oral, Q6H PRN, Lynden Oxford, MD;  oxyCODONE (Oxy IR/ROXICODONE) immediate release tablet 10 mg, 10 mg, Oral, Q4H PRN, Roma Kayser Schorr, NP, 10 mg at 10/21/13 1129;  pantoprazole (PROTONIX) EC tablet 40 mg, 40 mg, Oral, BID AC, Lynden Oxford, MD, 40 mg at 10/21/13 1478 pregabalin (LYRICA) capsule 150 mg, 150 mg, Oral, TID, Lynden Oxford, MD, 150 mg at 10/21/13 0920;  promethazine (PHENERGAN) tablet 25 mg, 25 mg, Oral, Q6H PRN, Lynden Oxford, MD, 25 mg at 10/20/13 0216  Patients Current Diet: regular with thins  Precautions / Restrictions Precautions Precautions: Fall Precaution Comments: Recent increase in numbers of falls Restrictions Weight Bearing Restrictions: No   Prior Activity Level Limited Community (1-2x/wk): P.t reports she goes out occasionally with son to the grocery store or to appointments.  She no longer drives. Home Assistive Devices / Equipment Home Assistive Devices/Equipment: Systems developer (specify type);Bedside commode/3-in-1 Home Equipment: Walker - 2 wheels;Bedside commode;Transport chair;Shower seat;Adaptive equipment  Prior Functional Level Prior Function Level of Independence: Needs assistance Gait / Transfers Assistance Needed: Use of RW with assistance for ambulation - could ambulate in community ADL's / Homemaking Assistance Needed: Bath and dress independently  Current Functional Level Cognition  Overall Cognitive Status: Within Functional Limits for tasks assessed Orientation Level: Oriented X4    Extremity Assessment (includes Sensation/Coordination)          ADLs  Overall ADL's : Needs assistance/impaired Grooming: Wash/dry hands;Oral care;Standing;Minimal  assistance Lower Body Dressing: Moderate assistance;Sit to/from stand Toilet Transfer: Minimal assistance;Ambulation;Min guard;RW (bed) Functional mobility during ADLs: Maximal assistance;Rolling walker;Min guard;Minimal assistance General ADL Comments: Recommended pt wear left wrist splint she had in room to keep wrist in neutral position. Educated on safety tips for home (sitting for most of LB ADLs, safe shoewear, use of bag on walker). Pt ambulated to sink to perform some grooming tasks. Educated on AE for LB ADLs-pt tried using sockaid.  Assisted with donning left wrist splint.    Mobility  Overal bed mobility: Needs Assistance Bed Mobility: Supine to Sit;Sit to Supine Supine to sit: Min assist Sit to supine: Supervision General bed mobility comments: assist with trunk to sit EOB. Cues for technique.    Transfers  Overall transfer level: Needs assistance Equipment used: Rolling walker (2 wheeled) Transfers: Sit  to/from Stand Sit to Stand: Min guard General transfer comment: Min assist for boost from lowest bed setting, Mod assist to safely control descent into reclining chair. VC for hand placement. Leans heavily over walker prior to assuming upright position.     Ambulation / Gait / Stairs / Wheelchair Mobility  Ambulation/Gait Ambulation/Gait assistance: Architect (Feet): 65 Feet Assistive device: Rolling walker (2 wheeled) Gait Pattern/deviations: Step-to pattern;Step-through pattern;Decreased step length - right;Decreased stride length;Ataxic;Staggering left;Staggering right Gait velocity interpretation: Below normal speed for age/gender General Gait Details: Min assist to maintain balance and stability. Educated on safe DME use with rolling walker. Slightly ataxic with inconsistent foot placement. Pt very fatigued at end of distance with complaints of LLE cramps/spasms. VC for sequencing, especially with turns.    Posture / Balance      Special needs/care  consideration  Continuous Drip IV  0.9% NaCl with KCI 4mEq/L  75 ml/hr              Skin   Bruise over right eye from fall; pt. Reports other bruises on body from falls                  Bowel mgmt:   Diarrhea on 10/19/13; no BM since that time Bladder mgmt: stress incontinence; otherwise continent with use of BSC Diabetic mgmt no     Previous Home Environment Living Arrangements: Children Available Help at Discharge: Family;Available 24 hours/day Type of Home: House Home Layout: One level Home Access: Ramped entrance Bathroom Shower/Tub: Health visitor: Standard Bathroom Accessibility: Yes How Accessible: Accessible via walker;Other (comment) (pt. 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  Says she had to retire in April of '15.  Pt. Is very open in discussing her ALS and states she has been told she has 3 years to live but wants to "fight to live 5 years" Anticipated Caregiver: son, Casimiro Needle, 28 years old; pt. states he has indicated he wants to be her primary caregiver.  Pt. is widowed ; son's dad died when he  was a year old.  Son has experience caring for his grandmother until her passing with home hospice. Ability/Limitations of Caregiver: Son is not in school or employed.  Pt. states she is trying to get him to take online classes.  She states her son "needs counseling" due to the reality that he has  lost his dad and will ultimately lose his mom. 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 has much equipment in the home already Pt/Family Agrees to Admission and willing to participate: Yes Program Orientation Provided & Reviewed with Pt/Caregiver Including Roles  & Responsibilities: Yes   Decrease burden of Care through IP rehab admission: no    Possible need for SNF  placement upon discharge:  Not anticipated   Patient Condition: This patient's condition remains as documented in the consult dated 10/21/13 , in which the Rehabilitation Physician determined and documented that the patient's condition is appropriate for intensive rehabilitative care in an inpatient rehabilitation facility. Will admit to inpatient rehab today.  Preadmission Screen Completed By:  Brendia Sacks , 10/21/2013 1:18 PM ______________________________________________________________________   Discussed status with Dr.  Riley Kill on 10/21/13 at  1340  and received telephone approval for admission today.  Admission Coordinator:  Darl Pikes Laporcha Marchesi,PT time 1340 Dorna Bloom 10/21/13

## 2013-10-21 NOTE — Progress Notes (Signed)
Occupational Therapy Treatment Patient Details Name: Katherine Potts MRN: [id number] DOB: 01-24-1961 Today's Date: 10/21/2013    History of present illness 53 y.o. female admitted with abdominal pain, nausea and vomiting. Hx of ALS, GERD, mitral valve prolapse, pneumonia, and anxiety.   OT comments  Patient tolerating L resting hand splint.  Follow Up Recommendations  CIR    Equipment Recommendations  None recommended by OT    Recommendations for Other Services      Precautions / Restrictions Precautions Precautions: Fall Precaution Comments: Recent increase in numbers of falls       Mobility Bed Mobility                  Transfers                      Balance                                   ADL                                         General ADL Comments: Splint check of left resting hand splint. Removed and skin inspected. No red areas or skin compromise noted. Patient reports the fit is good. Patient also reports she will be discharged to inpatient rehab today.       Vision                     Perception     Praxis      Cognition   Behavior During Therapy: WFL for tasks assessed/performed Overall Cognitive Status: Within Functional Limits for tasks assessed                       Extremity/Trunk Assessment               Exercises     Shoulder Instructions       General Comments      Pertinent Vitals/ Pain       Pain Assessment: No/denies pain  Home Living                           Bathroom Accessibility: Yes How Accessible: Accessible via walker;Other (comment) (pt. unsure if w/c would go through doorway)            Prior Functioning/Environment              Frequency Min 2X/week     Progress Toward Goals  OT Goals(current goals can now be found in the care plan section)        Plan Discharge plan remains appropriate     Co-evaluation                 End of Session Equipment Utilized During Treatment: Other (comment) (left wrist splint)   Activity Tolerance Patient tolerated treatment well   Patient Left in bed;with call bell/phone within reach   Nurse Communication Other (comment) (splint check)        Time: 2952-8413 OT Time Calculation (min): 8 min  Charges: OT General Charges $OT Visit: 1 Procedure OT Treatments $Orthotics Fit/Training: 8-22 mins  Tam Savoia A 10/21/2013, 1:39 PM

## 2013-10-21 NOTE — Discharge Summary (Signed)
Physician Discharge Summary  Patient ID: Katherine Potts MRN: [id number] DOB/AGE: 04-Mar-1960 53 y.o.  Admit date: 10/19/2013 Discharge date: 10/21/2013  Primary Care Physician:  Gretel Acre, MD  Discharge Diagnoses:    . Intractable nausea and vomiting/gastroenteritis  . GERD (gastroesophageal reflux disease) . ALS (amyotrophic lateral sclerosis) . Closed fracture of left distal radius . Depression  Consults: CIR   Recommendations for Outpatient Follow-up:  Continue follow up outpatient with neurology  Allergies:   Allergies  Allergen Reactions  . Bactrim [Sulfamethoxazole-Trimethoprim] Nausea Only  . Ciprofloxacin Nausea And Vomiting     Discharge Medications:   Medication List         ARIPiprazole 2 MG tablet  Commonly known as:  ABILIFY  Take 2 mg by mouth daily.     calcium carbonate 500 MG chewable tablet  Commonly known as:  TUMS - dosed in mg elemental calcium  Chew 1 tablet by mouth daily.     dexamethasone 2 MG tablet  Commonly known as:  DECADRON  Take 2 mg by mouth daily.     diazepam 5 MG tablet  Commonly known as:  VALIUM  Take 1 tablet (5 mg total) by mouth every 12 (twelve) hours as needed for muscle spasms.     esomeprazole 40 MG capsule  Commonly known as:  NEXIUM  Take 40 mg by mouth daily before breakfast.     FLUoxetine 20 MG capsule  Commonly known as:  PROZAC  Take 20 mg by mouth daily.     LORazepam 1 MG tablet  Commonly known as:  ATIVAN  Take 2 mg by mouth 3 (three) times daily as needed for anxiety.     LYRICA 150 MG capsule  Generic drug:  pregabalin  Take 150 mg by mouth 3 (three) times daily.     methadone 5 MG tablet  Commonly known as:  DOLOPHINE  Take 5 mg by mouth every 8 (eight) hours as needed for severe pain.     ondansetron 4 MG tablet  Commonly known as:  ZOFRAN  Take 1 tablet (4 mg total) by mouth every 6 (six) hours.     promethazine 25 MG tablet  Commonly known as:  PHENERGAN  Take 25 mg by mouth  every 6 (six) hours as needed for nausea or vomiting.     vitamin C 500 MG tablet  Commonly known as:  ASCORBIC ACID  Take 1 tablet (500 mg total) by mouth daily.         Brief H and P: For complete details please refer to admission H and P, but in brief Katherine Potts is a 53 y.o. female with Past medical history of GERD, anxiety, ALS, chronic pain.  The patient presents with complaints of abdominal pain that has been ongoing since last 4 days associated with nausea and vomiting. The patient had not been able to take her medications at home due to her persistent nausea and vomiting. Patient reported episodes of diarrhea that has been ongoing since the same time.  Patient continues to have diffuse abdominal pain at the time of my evaluation along with nausea and dry heaving he did she complains of generalized fatigue and tiredness with inability to move her hands and activities. There is also reported that the patient is not able to mobilize on her own which she was able to do that prior to this episode.  No other recent change in her medications.  Patient has history of chronic ileus and she has  significant decline over last few months with her mobility. Was evaluated by hospice few months ago, and was not qualifying for the same.   Hospital Course:   1. Intractable nausea and vomiting : Due to acute gastroenteritis and chronic ileus, improved. Patient is now tolerating solid diet. CT abdomen and pelvis was done which was negative tolerating Continue Zofran, Protonix, added Bentyl for abdominal cramping.    2. ALS. Apparently progressively getting worse, the patient was recently diagnosed this year. Sees Dr. Anne Hahn as her neurologist and at [facility]. The patient has been having progressive decline over last few months.   She will need outpatient followup with her neurologist. Continue home pain medication regimen.   3. left wrist pain  Transverse slightly impacted and angulated  fracture of the distal in April 2015  Patient currently wearing a wrist splint  Continue pain control, PT OT evaluation was done, recommended CIR.    Day of Discharge BP 139/96  Pulse 73  Temp(Src) 98.1 F (36.7 C) (Oral)  Resp 16  Ht  (1.626 m)  Wt 63.322 kg (139 lb 9.6 oz)  BMI 23.95 kg/m2  SpO2 98%  Physical Exam: General: Alert and awake oriented x3 not in any acute distress. CVS: S1-S2 clear no murmur rubs or gallops Chest: clear to auscultation bilaterally, no wheezing rales or rhonchi Abdomen: soft nontender, nondistended, normal bowel sounds Extremities: no cyanosis, clubbing or edema noted bilaterally    The results of significant diagnostics from this hospitalization (including imaging, microbiology, ancillary and laboratory) are listed below for reference.    LAB RESULTS: Basic Metabolic Panel:  Recent Labs Lab 10/19/13 1248 10/20/13 0750  NA 142 142  K 3.7 3.1*  CL 106 109  CO2 21 19  GLUCOSE 99 71  BUN 20 18  CREATININE 1.27* 1.15*  CALCIUM 9.7 8.1*  MG  --  1.4*   Liver Function Tests:  Recent Labs Lab 10/19/13 1248 10/20/13 0750  AST 18 15  ALT 20 14  ALKPHOS 61 52  BILITOT 0.5 0.4  PROT 7.0 5.5*  ALBUMIN 3.9 3.1*    Recent Labs Lab 10/19/13 1248 10/20/13 0750  LIPASE 67* 56   No results found for this basename: AMMONIA,  in the last 168 hours CBC:  Recent Labs Lab 10/19/13 1248 10/20/13 0750  WBC 10.7* 6.6  NEUTROABS 8.0*  --   HGB 12.1 10.9*  HCT 35.8* 31.7*  MCV 91.3 94.3  PLT 209 157   Cardiac Enzymes: No results found for this basename: CKTOTAL, CKMB, CKMBINDEX, TROPONINI,  in the last 168 hours BNP: No components found with this basename: POCBNP,  CBG: No results found for this basename: GLUCAP,  in the last 168 hours  Significant Diagnostic Studies:  Ct Abdomen Pelvis Wo Contrast  10/19/2013   CLINICAL DATA:  Left-sided abdominal pain for 4 days  EXAM: CT ABDOMEN AND PELVIS WITHOUT CONTRAST  TECHNIQUE:  Multidetector CT imaging of the abdomen and pelvis was performed following the standard protocol without IV contrast.  COMPARISON:  None.  FINDINGS: Lung bases clear except for 6 mm calcified nodule left lower lobe likely a benign granuloma. Bilateral breast implants noted.  Liver, gallbladder, spleen, pancreas, and adrenal glands normal. Right kidney not identified. Left kidney normal. Probable small splenule left upper quadrant.  Mild calcification of the aorta. Stomach and small bowel normal. Appendix normal. Mild diverticulosis distal descending colon, with large bowel otherwise negative.  Bladder and reproductive organs are normal. No acute musculoskeletal findings. No  adenopathy or ascites.  IMPRESSION: No acute abnormalities.   Electronically Signed   By: Esperanza Heir M.D.   On: 10/19/2013 17:59   Dg Wrist Complete Left  10/20/2013   CLINICAL DATA:  LEFT wrist fracture with ORIF on 07/09/2013  EXAM: LEFT WRIST - COMPLETE 3+ VIEW  COMPARISON:  06/19/2013  FINDINGS: Osseous demineralization.  Volar plate and multiple screws identified at the distal LEFT radius post ORIF of the previously identified distal LEFT radial metaphyseal fracture.  Mild obliquity of positioning on lateral view limits assessment.  Nonunion of a previously identified ulnar styloid fracture fragment.  Joint spaces grossly preserved.  No additional fracture or dislocation.  IMPRESSION: Post ORIF of previously identified distal LEFT radial metaphyseal fracture.  Persistent visualization of ulnar styloid fracture fragment.  Osseous demineralization.   Electronically Signed   By: Ulyses Southward M.D.   On: 10/20/2013 12:10       Disposition and Follow-up:    DISPOSITION: Inpatient rehabilitation  DIET: Regular diet    DISCHARGE FOLLOW-UP Follow-up Information   Follow up with NNODI, ADAKU, MD. Schedule an appointment as soon as possible for a visit in 2 weeks. (for hospital follow-up)    Specialty:  Family Medicine    Contact information:   [address] [place] [address] [phone]       Time spent on Discharge: 40 mins  Signed:   Ata Pecha M.D. [facility] 10/21/2013, 1:35 PM Pager: [phone]   **Disclaimer: This note was dictated with voice recognition software. Similar sounding words can inadvertently be transcribed and this note may contain transcription errors which may not have been corrected upon publication of note.**

## 2013-10-21 NOTE — Progress Notes (Signed)
Inpatient Rehabilitation  I met with Katherine Potts at the bedside to discuss her post  acute rehab options.  I answered her questions and provided her with informational booklets.  She strongly wants to come to IP rehab.  I have discussed with Dr. Tana Coast and she will place Dc orders.  I also discussed with RN Jeani Hawking and with Ricki Miller CM.  I will arrange for admission today.  Please call if questions.  [facility] Admissions Coordinator Cell [phone] Office [phone]

## 2013-10-22 ENCOUNTER — Inpatient Hospital Stay (HOSPITAL_COMMUNITY): Payer: Medicare Other

## 2013-10-22 ENCOUNTER — Inpatient Hospital Stay (HOSPITAL_COMMUNITY): Payer: Medicare Other | Admitting: *Deleted

## 2013-10-22 LAB — CBC WITH DIFFERENTIAL/PLATELET
BASOS ABS: 0 10*3/uL (ref 0.0–0.1)
BASOS PCT: 0 % (ref 0–1)
Eosinophils Absolute: 0.2 10*3/uL (ref 0.0–0.7)
Eosinophils Relative: 2 % (ref 0–5)
HEMATOCRIT: 35.7 % — AB (ref 36.0–46.0)
HEMOGLOBIN: 12.1 g/dL (ref 12.0–15.0)
LYMPHS PCT: 46 % (ref 12–46)
Lymphs Abs: 4 10*3/uL (ref 0.7–4.0)
MCH: 31.9 pg (ref 26.0–34.0)
MCHC: 33.9 g/dL (ref 30.0–36.0)
MCV: 94.2 fL (ref 78.0–100.0)
MONO ABS: 0.6 10*3/uL (ref 0.1–1.0)
MONOS PCT: 7 % (ref 3–12)
NEUTROS ABS: 4 10*3/uL (ref 1.7–7.7)
NEUTROS PCT: 45 % (ref 43–77)
Platelets: 190 10*3/uL (ref 150–400)
RBC: 3.79 MIL/uL — ABNORMAL LOW (ref 3.87–5.11)
RDW: 12.6 % (ref 11.5–15.5)
WBC: 8.8 10*3/uL (ref 4.0–10.5)

## 2013-10-22 LAB — COMPREHENSIVE METABOLIC PANEL
ALBUMIN: 3.5 g/dL (ref 3.5–5.2)
ALK PHOS: 58 U/L (ref 39–117)
ALT: 13 U/L (ref 0–35)
AST: 14 U/L (ref 0–37)
Anion gap: 13 (ref 5–15)
BUN: 14 mg/dL (ref 6–23)
CO2: 21 mEq/L (ref 19–32)
Calcium: 9.1 mg/dL (ref 8.4–10.5)
Chloride: 108 mEq/L (ref 96–112)
Creatinine, Ser: 1.27 mg/dL — ABNORMAL HIGH (ref 0.50–1.10)
GFR calc Af Amer: 55 mL/min — ABNORMAL LOW (ref >90)
GFR, EST NON AFRICAN AMERICAN: 48 mL/min — AB (ref >90)
Glucose, Bld: 99 mg/dL (ref 70–99)
POTASSIUM: 3.9 meq/L (ref 3.7–5.3)
Sodium: 142 mEq/L (ref 137–147)
Total Bilirubin: 0.2 mg/dL — ABNORMAL LOW (ref 0.3–1.2)
Total Protein: 6.5 g/dL (ref 6.0–8.3)

## 2013-10-22 MED ORDER — POLYETHYLENE GLYCOL 3350 17 G PO PACK
17.0000 g | PACK | Freq: Every day | ORAL | Status: DC
  Administered 2013-10-22 – 2013-10-28 (×7): 17 g via ORAL
  Filled 2013-10-22 (×10): qty 1

## 2013-10-22 MED ORDER — DOCUSATE SODIUM 100 MG PO CAPS
100.0000 mg | ORAL_CAPSULE | Freq: Two times a day (BID) | ORAL | Status: DC
  Administered 2013-10-22 – 2013-11-03 (×19): 100 mg via ORAL
  Filled 2013-10-22 (×29): qty 1

## 2013-10-22 MED ORDER — ALBUTEROL SULFATE (2.5 MG/3ML) 0.083% IN NEBU: 2.5000 mg | INHALATION_SOLUTION | Freq: Three times a day (TID) | RESPIRATORY_TRACT | Status: DC | PRN

## 2013-10-22 NOTE — Progress Notes (Signed)
Patient information reviewed and entered into eRehab system by Aiman Sonn, RN, CRRN, PPS Coordinator.  Information including medical coding and functional independence measure will be reviewed and updated through discharge.     Per nursing patient was given "Data Collection Information Summary for Patients in Inpatient Rehabilitation Facilities with attached "Privacy Act Statement-Health Care Records" upon admission.  

## 2013-10-22 NOTE — Progress Notes (Signed)
[place] PHYSICAL MEDICINE & REHABILITATION     PROGRESS NOTE    Subjective/Complaints: Slept for about 5 hours. Feels that nausea is better. Upper back is sore. Appetite may be a little better. Ready to start her therapy day.   Objective: Vital Signs: Blood pressure 135/58, pulse 79, temperature 99 F (37.2 C), temperature source Oral, resp. rate 17, height  (1.626 m), weight 67.8 kg (149 lb 7.6 oz), SpO2 97.00%. Dg Wrist Complete Left  10/20/2013   CLINICAL DATA:  LEFT wrist fracture with ORIF on 07/09/2013  EXAM: LEFT WRIST - COMPLETE 3+ VIEW  COMPARISON:  06/19/2013  FINDINGS: Osseous demineralization.  Volar plate and multiple screws identified at the distal LEFT radius post ORIF of the previously identified distal LEFT radial metaphyseal fracture.  Mild obliquity of positioning on lateral view limits assessment.  Nonunion of a previously identified ulnar styloid fracture fragment.  Joint spaces grossly preserved.  No additional fracture or dislocation.  IMPRESSION: Post ORIF of previously identified distal LEFT radial metaphyseal fracture.  Persistent visualization of ulnar styloid fracture fragment.  Osseous demineralization.   Electronically Signed   By: Ulyses Southward M.D.   On: 10/20/2013 12:10    Recent Labs  10/21/13 2052 10/22/13 0620  WBC 8.8 8.8  HGB 11.8* 12.1  HCT 34.0* 35.7*  PLT 180 190    Recent Labs  10/19/13 1248 10/20/13 0750 10/21/13 2052  NA 142 142  --   K 3.7 3.1*  --   CL 106 109  --   GLUCOSE 99 71  --   BUN 20 18  --   CREATININE 1.27* 1.15* 1.26*  CALCIUM 9.7 8.1*  --    CBG (last 3)  No results found for this basename: GLUCAP,  in the last 72 hours  Wt Readings from Last 3 Encounters:  10/21/13 67.8 kg (149 lb 7.6 oz)  10/20/13 63.322 kg (139 lb 9.6 oz)  07/09/13 65.772 kg (145 lb)    Physical Exam:  53 year old frail female  HENT: oral mucosa pink/moist  Head: Normocephalic.  Eyes: EOM are normal.  Neck: Normal range of  motion. Neck supple. No thyromegaly present.  Cardiovascular: Normal rate and regular rhythm.  Respiratory: Effort normal and breath sounds normal. No respiratory distress.  GI: Soft. Bowel sounds are normal. She exhibits no distension.  Neurological:  Reflex Scores:  Tricep reflexes are 3+ on the right side and 3+ on the left side.  Bicep reflexes are 3+ on the right side and 3+ on the left side.  Brachioradialis reflexes are 3+ on the right side and 3+ on the left side.  Patellar reflexes are 3+ on the right side and 3+ on the left side.  Achilles reflexes are 4+ on the right side and 4+ on the left side.  RUE 5/5 in deltoid Bi, tri grip is 3 to 3+  LUE 3- delt, 4- Bi, tri, 0/5 L WE, FE, 2- FF  RLE 4/5 HF, KE, 2- ankle DF  LLE 4/5 HF KE, ADF   Mild facial diplegia persistent  Clonus Bilateral ankles  Intact PP/LT in all 4's.  Fair insight and awareness, perhaps a little impulsive  Skin: bruise around right eye, a few other scattered bruises on the extremities.  Musc: left wrist/hand splint in place, tight wrist  Psych: patient pleasant and cooperative, a little anxious/impulsive   Assessment/Plan: 1. Functional deficits secondary to deconditioning in the setting of baseline ALS which require 3+ hours per day of  interdisciplinary therapy in a comprehensive inpatient rehab setting. Physiatrist is providing close team supervision and 24 hour management of active medical problems listed below. Physiatrist and rehab team continue to assess barriers to discharge/monitor patient progress toward functional and medical goals. FIM:                                  Medical Problem List and Plan:  1. 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 Additional deconditioning after medical issues above  2. DVT Prophylaxis/Anticoagulation: Lovenox 40 mg daily. Monitor platelet counts and any signs of  3. Pain  Management/chronic pain syndrome: Lyrica 150 mg 3 times a day, Methadone 5 mg every 8 hours as needed, oxycodone 10 mg every 4 hours as needed, Valium 5 mg every 12 hours as needed  4. Mood/depression: Abilify 10 mg daily, Prozac 20 mg daily, Ativan 2 mg 3 times a day as needed. Provide emotional support  5. Neuropsych: This patient is capable of making decisions on her own behalf.  6. Skin/Wound Care: Routine skin care. Patient multiple bruising from falls  7. History of ileus with nonspecific abdominal pain. Continue Bentyl 10 mg 3 times a day as well as nexium  8. Left wrist fracture April 2015. Continue splint conservative care/weightbearing as tolerated  9. Nausea/GERD---nexium bid  -rx nausea prn,  -monitor po intake, encouraged her to eat food from home if she wishes. ?megace if needed  LOS (Days) 1 A FACE TO FACE EVALUATION WAS PERFORMED  SWARTZ,ZACHARY T 10/22/2013 7:51 AM

## 2013-10-22 NOTE — Plan of Care (Addendum)
Problem: RH BOWEL ELIMINATION Goal: RH STG MANAGE BOWEL WITH ASSISTANCE STG Manage Bowel with minimal Assistance.  Outcome: Not Progressing Pt c/o no bowel movement MD aware; encouraged food intake; pt claims she's passing gas.

## 2013-10-22 NOTE — Evaluation (Signed)
Occupational Therapy Assessment and Plan  Patient Details  Name: Katherine Potts MRN: [id number] Date of Birth: 03/17/1960  OT Diagnosis: cognitive deficits, muscle weakness (generalized) and progressive muscular atrophy Rehab Potential: Rehab Potential: Good ELOS: 12-14 days   Today's Date: 10/22/2013 OT Individual Time: 0867-6195 OT Individual Time Calculation (min): 68 min     Problem List:  Patient Active Problem List   Diagnosis Date Noted  . Nausea & vomiting 10/19/2013  . Intractable nausea and vomiting 10/19/2013  . Closed fracture of left distal radius 07/09/2013  . 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 Weakness of hand 12/31/2012  . Incisional hernia, without obstruction or gangrene 07/02/2012  . GERD (gastroesophageal reflux disease) 03/06/2012  . Depression 03/06/2012    Past Medical History:  Past Medical History  Diagnosis Date  . GERD (gastroesophageal reflux disease)   . Anxiety   . Depression   . MVP (mitral valve prolapse)   . Cervical spondylosis   . 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 Pneumonia   . Constipation    Past Surgical History:  Past Surgical History  Procedure Laterality Date  . Eye surgery    . Breast surgery    . Vagina surgery      mesh  . Kidney donation      Right nephrectomy  . Abdominal hernia repair    . Orif wrist fracture  07/09/2013    DR Caralyn Guile  . Orif wrist fracture Left 07/09/2013    Procedure: OPEN REDUCTION INTERNAL FIXATION (ORIF) LEFT  WRIST FRACTURE;  Surgeon: Linna Hoff, MD;  Location: [facility];  Service: Orthopedics;  Laterality: Left;    Assessment & Plan Clinical Impression: Patient is a 53 y.o. 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 Jannifer Franklin. Patient lives with her 52 year old son and assistance as needed. She used a walker and  needed assistance for activities of daily living prior to admission. Admitted 10/19/2013 with abdominal pain with nausea, diarrhea, vomiting as well as generalized weakness and falls. Patient with progressive decline over the last few months. CT of abdomen and pelvis negative. Subcutaneous Lovenox added for DVT prophylaxis. Diet slowly advanced to regular consistency. Noted pronounced deconditioning with limited mobility with physical therapy evaluation completed on 25 2015 with recommendations of physical medicine rehabilitation consult. Pt. admttted to IP rehab on 10/21/13.  Pt states she was diagnosed with ALS in May of this year however neuro notes indicate diagnosis in 12/2012, goes to [facility] clinic and sees local neurologist Dr Jannifer Franklin as well.  Patient transferred to CIR on 10/21/2013 .    Patient currently requires mod with basic self-care skills secondary to unbalanced muscle activation and decreased coordination and decreased attention and delayed processing.  Prior to hospitalization, patient could complete BADL  with min.  Patient will benefit from skilled intervention to decrease level of assist with basic self-care skills prior to discharge home with care partner.  Anticipate patient will require minimal physical assistance and follow up home health. OT - End of Session Activity Tolerance: Tolerates 10 - 20 min activity with multiple rests Endurance Deficit: Yes Endurance Deficit Description: fatigues quickly, requires frequent rest breaks OT Assessment Rehab Potential: Good OT Patient demonstrates impairments in the following area(s): Behavior;Balance;Cognition;Endurance;Motor;Safety OT Basic ADL's Functional Problem(s): Eating;Grooming;Bathing;Dressing;Toileting OT Additional Impairment(s): Fuctional Use of Upper Extremity (impaired LUE function (wrist, fingers, thumb)) OT Plan  OT Intensity: Minimum of 1-2 x/day, 45 to 90 minutes OT Frequency: 5 out of 7 days OT Duration/Estimated  Length of Stay: 12-14 days OT Treatment/Interventions: Discharge planning;Functional mobility training;Patient/family education;Self Care/advanced ADL retraining;Therapeutic Exercise;Therapeutic Activities;UE/LE Coordination activities;UE/LE Strength taining/ROM OT Self Feeding Anticipated Outcome(s): Mod I OT Basic Self-Care Anticipated Outcome(s): Min A OT Toileting Anticipated Outcome(s): Mod I OT Bathroom Transfers Anticipated Outcome(s): Supervision OT Recommendation Patient destination: Home Follow Up Recommendations: Home health OT Equipment Recommended: None recommended by OT Equipment Details: has ramp, w/c, shower seat (step in shower), BSC already  Skilled Therapeutic Intervention 1:1 OT initial evaluation completed with treatment provided to emphasize improved activity tolerance, safety awareness, functional transfers, orthotics re-ed and management, and adapted bathing.   Pt received supine in bed reporting low energy due to poor sleep (approx 4 hrs).    Pt had no clothing available and elected to bathe at sink versus walk-in shower after requesting assist with toilet transfer to toilet.   Pt performed bath at sink with mod assist for thoroughness with mod verbal cues to organize tasks and setup assist to provide supplies.   Pt demo'd moderate posterior instability during sit<>stand and brief ambulation requiring steadying assist with constant contact/support to prevent LOB d/t LE weakness.   Pt left in w/c with RN attending care.  OT Evaluation Precautions/Restrictions  Precautions Precautions: Fall Precaution Comments: Recent increase in numbers of falls Required Braces or Orthoses: Other Brace/Splint Other Brace/Splint: Left wrist cock-up splint while up; resting hand splint @ night to prevent contractures Restrictions Weight Bearing Restrictions: No  General Chart Reviewed: Yes Family/Caregiver Present: No  Vital Signs Therapy Vitals Temp: 98.7 F (37.1 C) Temp src:  Oral Pulse Rate: 86 Resp: 18 BP: 127/97 mmHg Patient Position (if appropriate): Sitting Oxygen Therapy SpO2: 98 % O2 Device: None (Room air)  Pain Pain Assessment Pain Assessment: No/denies pain Pain Score: 0-No pain  Home Living/Prior Functioning Home Living Available Help at Discharge: Family;Available 24 hours/day Type of Home: House Home Access: Ramped entrance Home Layout: One level  Lives With: Son IADL History Occupation: Retired Type of Occupation: Therapist, sports Leisure and Hobbies: pets: dogs, Neurosurgeon, lizards Prior Function Level of Independence: Requires assistive device for independence;Needs assistance with ADLs;Needs assistance with homemaking Dressing: Minimal Shopping: Moderate Pet Care: Moderate  Able to Take Stairs?: Yes Driving: No Vocation: On disability Leisure: Hobbies-yes (Comment) Comments: Son helps with bathing, dressing, and cooking meals  ADL ADL ADL Comments: see FIM  Vision/Perception  Vision- History Baseline Vision/History: No visual deficits Patient Visual Report: Blurring of vision;Other (comment) Vision- Assessment Vision Assessment?: Vision impaired- to be further tested in functional context Additional Comments: Pain reports need for visual assessment for reading Perception Comments: WFL   Cognition Overall Cognitive Status: Within Functional Limits for tasks assessed Arousal/Alertness: Awake/alert Orientation Level: Oriented X4 Attention: Sustained Focused Attention: Appears intact Sustained Attention: Impaired Sustained Attention Impairment: Functional basic Selective Attention: Impaired Selective Attention Impairment: Functional complex Alternating Attention: Impaired Alternating Attention Impairment: Functional complex Memory: Appears intact Awareness: Appears intact Problem Solving: Impaired Problem Solving Impairment: Functional complex Executive Function: Writer: Impaired Organizing Impairment: Functional  basic Behaviors: Impulsive;Verbal agitation;Poor frustration tolerance;Lability Safety/Judgment: Appears intact  Sensation Sensation Light Touch: Appears Intact Stereognosis: Appears Intact Hot/Cold: Appears Intact Proprioception: Appears Intact Coordination Gross Motor Movements are Fluid and Coordinated: No Fine Motor Movements are Fluid and Coordinated: No Coordination and Movement Description: impaired at left-UE  Motor  Motor Motor: Abnormal postural alignment and control;Abnormal tone Motor - Skilled Clinical  Observations: spasms in back and B LEs  Mobility  Bed Mobility Bed Mobility: Supine to Sit;Sit to Supine Supine to Sit: 5: Supervision Sit to Supine: 5: Supervision Transfers Transfers: Sit to Stand;Stand to Sit Sit to Stand: 3: Mod assist Sit to Stand Details: Tactile cues for placement;Tactile cues for posture Stand to Sit: 3: Mod assist Stand to Sit Details (indicate cue type and reason): Tactile cues for posture;Tactile cues for placement;Verbal cues for precautions/safety   Trunk/Postural Assessment  Cervical Assessment Cervical Assessment: Within Functional Limits Thoracic Assessment Thoracic Assessment: Within Functional Limits Lumbar Assessment Lumbar Assessment: Within Functional Limits Postural Control Postural Control: Deficits on evaluation Righting Reactions: delayed Protective Responses: delayed   Balance    Extremity/Trunk Assessment RUE Assessment RUE Assessment: Within Functional Limits LUE Assessment LUE Assessment: Exceptions to [facility] LUE Strength LUE Overall Strength: Deficits LUE Tone LUE Tone: Hypertonic Hypertonic Details: imbalanced muscle activation: hypertonic flexion at fingers and thumb  FIM:  FIM - Grooming Grooming Steps: Wash, rinse, dry face;Wash, rinse, dry hands;Oral care, brush teeth, clean dentures Grooming: 4: Patient completes 3 of 4 or 4 of 5 steps FIM - Bathing Bathing Steps Patient Completed: Chest;Right  Arm;Left Arm;Abdomen;Front perineal area;Right upper leg;Left upper leg Bathing: 3: Mod-Patient completes 5-7 25f10 parts or 50-74% FIM - Upper Body Dressing/Undressing Upper body dressing/undressing: 0: Wears gown/pajamas-no public clothing FIM - Lower Body Dressing/Undressing Lower body dressing/undressing: 0: Wears gown/pajamas-no public clothing FIM - Toileting Toileting steps completed by patient: Performs perineal hygiene Toileting: 3: Mod-Patient completed 2 of 3 steps FIM - BControl and instrumentation engineerDevices: WEnvironmental consultantBed rails;Arm rests Bed/Chair Transfer: 5: Supine > Sit: Supervision (verbal cues/safety issues);3: Bed > Chair or W/C: Mod A (lift or lower assist) FIM - TRadio producerDevices: Grab bars Toilet Transfers: 4-To toilet/BSC: Min A (steadying Pt. > 75%);4-From toilet/BSC: Min A (steadying Pt. > 75%)   Refer to Care Plan for Long Term Goals  Recommendations for other services: None  Discharge Criteria: Patient will be discharged from OT if patient refuses treatment 3 consecutive times without medical reason, if treatment goals not met, if there is a change in medical status, if patient makes no progress towards goals or if patient is discharged from hospital.  The above assessment, treatment plan, treatment alternatives and goals were discussed and mutually agreed upon: by patient  Second session: Time: 1300-1405 Time Calculation (min):  65 min  Pain Assessment: 4/10   Skilled Therapeutic Interventions: ADL-retraining with focus on therapeutic bathing to reduce symptoms of acute back spasms/pain.   Pt was received seated in w/c finishing her lunch with K-pad placed to reduce pain.   Pt c/o pain and fatigue but engaged in bathing as means to reduce spasms after education from OT on benefit of shower beyond cleansing.    Pt required extra time and setup to provide supplies after toileting first to void urine.    Pt  clarified that she has a modified toilet at home resembling/described as a bidet which she referred to as a "bile toilet" for hygiene but she is able to wipe from the front.   Pt required only steadying assist to transfer to tub bench using grab bars with hand held assist during mobility.   Pt required mod assist to wash her hair, buttocks, and support LE as she attempted washing feet.   Pt returned to w/c to groom at sink to inlcude applying make-up with setup assist to open containers and min assist to apply  eyeliner.   Pt requested to remain in w/c while visiting with family mbr Joe (father-in-law).   Pt was more alert and attentive during this session although still requiring verbal redirection to complete each task before progressing.   See FIM for current functional status  Therapy/Group: Individual Therapy  [place] 10/22/2013, 2:32 PM

## 2013-10-22 NOTE — Interval H&P Note (Signed)
Katherine Potts was admitted today to Inpatient Rehabilitation with the diagnosis of deconditioning/ALS.  The patient's history has been reviewed, patient examined, and there is no change in status.  Patient continues to be appropriate for intensive inpatient rehabilitation.  I have reviewed the patient's chart and labs.  Questions were answered to the patient's satisfaction.  This encounter and document were completed on 10/21/13. The H&P is now being placed in the rehab encounter for the purpose of charting.  SWARTZ,ZACHARY T 10/22/2013, 6:15 AM

## 2013-10-22 NOTE — Evaluation (Signed)
Physical Therapy Assessment and Plan  Patient Details  Name: Katherine Potts MRN: [id number] Date of Birth: 1960-10-25  PT Diagnosis: Abnormal posture, Abnormality of gait, Coordination disorder, Hypertonia, Impaired cognition, Low back pain, Muscle spasms and Muscle weakness Rehab Potential: Fair ELOS: 12-14 days   Today's Date: 10/22/2013 PT Individual Time: 0900-0930 and 1500-1530 PT Individual Time Calculation (min): 30 min and 30 min  Problem List:  Patient Active Problem List   Diagnosis Date Noted  . Nausea & vomiting 10/19/2013  . Intractable nausea and vomiting 10/19/2013  . Closed fracture of left distal radius 07/09/2013  . 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 Weakness of hand 12/31/2012  . Incisional hernia, without obstruction or gangrene 07/02/2012  . GERD (gastroesophageal reflux disease) 03/06/2012  . Depression 03/06/2012    Past Medical History:  Past Medical History  Diagnosis Date  . GERD (gastroesophageal reflux disease)   . Anxiety   . Depression   . MVP (mitral valve prolapse)   . Cervical spondylosis   . 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 Pneumonia   . Constipation    Past Surgical History:  Past Surgical History  Procedure Laterality Date  . Eye surgery    . Breast surgery    . Vagina surgery      mesh  . Kidney donation      Right nephrectomy  . Abdominal hernia repair    . Orif wrist fracture  07/09/2013    DR Caralyn Guile  . Orif wrist fracture Left 07/09/2013    Procedure: OPEN REDUCTION INTERNAL FIXATION (ORIF) LEFT  WRIST FRACTURE;  Surgeon: Linna Hoff, MD;  Location: [place];  Service: Orthopedics;  Laterality: Left;    Assessment & Plan Clinical Impression: [name] is a 53 y.o. 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 Jannifer Franklin diagnosed November of  2014 and also goes to [facility]. Patient lives with her 75 year old son and assistance as needed. She used a walker and needed assistance for activities of daily living prior to admission. Admitted 10/19/2013 with abdominal pain with nausea, diarrhea, vomiting as well as generalized weakness and falls. Patient with progressive decline over the last few months. CT of abdomen and pelvis negative. Subcutaneous Lovenox added for DVT prophylaxis. Diet slowly advanced to regular consistency. The patient ultimately become severely deconditioning with limited mobility. physical therapy became involved, PM&R was consulted, and it was felt the patient would benefit from an inpatient rehab admission.Patient transferred to [facility] on 10/21/2013 .   Patient currently requires mod with mobility secondary to muscle weakness, decreased cardiorespiratoy endurance, impaired timing and sequencing, abnormal tone, unbalanced muscle activation and decreased coordination, decreased attention and decreased problem solving and decreased standing balance, decreased postural control and decreased balance strategies.  Prior to hospitalization, patient was min with mobility and lived with Son in a House home.  Home access is  Ramped entrance.  Patient will benefit from skilled PT intervention to maximize safe functional mobility, minimize fall risk and decrease caregiver burden for planned discharge home with 24 hour supervision.  Anticipate patient will benefit from follow up [place] at discharge.  PT - End of Session Activity Tolerance: Tolerates 30+ min activity with multiple rests Endurance Deficit: Yes Endurance Deficit Description: fatigues quickly, requires frequent rest breaks PT Assessment Rehab Potential: Fair Barriers to Discharge: Decreased caregiver support Barriers to Discharge  Comments: Patient lives with son, but reports multiple falls at home- question his ability to care for her at home. PT Patient demonstrates  impairments in the following area(s): Balance;Behavior;Endurance;Motor;Pain;Safety PT Transfers Functional Problem(s): Bed Mobility;Bed to Chair;Car;Furniture;Floor PT Locomotion Functional Problem(s): Ambulation;Wheelchair Mobility PT Plan PT Intensity: Minimum of 1-2 x/day ,45 to 90 minutes PT Frequency: 5 out of 7 days PT Duration Estimated Length of Stay: 12-14 days PT Treatment/Interventions: Ambulation/gait training;Disease management/prevention;Pain management;Stair training;Wheelchair propulsion/positioning;Therapeutic Activities;Patient/family education;DME/adaptive equipment instruction;Balance/vestibular training;Cognitive remediation/compensation;Psychosocial support;Therapeutic Exercise;UE/LE Strength taining/ROM;Functional mobility training;Community reintegration;Discharge planning;Neuromuscular re-education;Splinting/orthotics;UE/LE Coordination activities PT Transfers Anticipated Outcome(s): supervision PT Locomotion Anticipated Outcome(s): supervision PT Recommendation Recommendations for Other Services: Neuropsych consult Follow Up Recommendations: Home health PT;24 hour supervision/assistance Patient destination: Home Equipment Recommended: To be determined Equipment Details: Patient owns RW, W/C, [facility], shower chair; further DME TBD upon discharge  Skilled Therapeutic Intervention Skilled therapeutic intervention initiated after completion of evaluation. Discussed falls risk, safety within room, and focus of therapy during stay. Discussed possible LOS, goals, and f/u therapy. Evaluation performed in 2 sessions secondary to patient's poor frustration tolerance, emotional lability, irritability with breakfast tray being brought up late. Patient also reporting overall dissatisfaction with time so far on rehab.  PT Evaluation Precautions/Restrictions Precautions Precautions: Fall Precaution Comments: Recent increase in numbers of falls Restrictions Weight Bearing  Restrictions: No General Chart Reviewed: Yes Family/Caregiver Present: No  Pain Pain Assessment Pain Assessment: No/denies pain Pain Score: 0-No pain Home Living/Prior Functioning Home Living Available Help at Discharge: Family;Available 24 hours/day Type of Home: House Home Access: Ramped entrance Home Layout: One level  Lives With: Son (30 y.o son; has boyfriend of 4 years who she stays with sometimes) Prior Function Level of Independence: Requires assistive device for independence;Needs assistance with ADLs;Needs assistance with homemaking  Able to Take Stairs?: Yes (as long as rail available) Driving: No Vocation: On disability Leisure: Hobbies-yes (Comment) (gardening, swimming) Comments: Son helps with bathing, dressing, and cooking meals Vision/Perception  Vision - Assessment Additional Comments: Patient reports she needs an evaluation for reading glasses; no apparent visual deficits otherwise  Cognition Overall Cognitive Status: Within Functional Limits for tasks assessed Arousal/Alertness: Awake/alert Orientation Level: Oriented X4 Attention: Sustained Focused Attention: Appears intact Sustained Attention: Impaired Sustained Attention Impairment: Functional basic Selective Attention: Impaired Selective Attention Impairment: Functional complex Alternating Attention: Impaired Alternating Attention Impairment: Functional complex Memory: Appears intact Awareness: Appears intact Problem Solving: Impaired Problem Solving Impairment: Functional complex Executive Function: Writer: Impaired Organizing Impairment: Functional basic Behaviors: Impulsive;Verbal agitation;Poor frustration tolerance;Lability Safety/Judgment: Appears intact Sensation Sensation Light Touch: Appears Intact Proprioception: Appears Intact Coordination Gross Motor Movements are Fluid and Coordinated: No Fine Motor Movements are Fluid and Coordinated: No Motor  Motor Motor:  Abnormal postural alignment and control;Abnormal tone Motor - Skilled Clinical Observations: spasms in back and B LEs  Mobility Bed Mobility Bed Mobility: Supine to Sit;Sit to Supine Supine to Sit: 5: Supervision;HOB flat Supine to Sit Details: Verbal cues for precautions/safety;Verbal cues for sequencing Sit to Supine: HOB flat;4: Min guard Sit to Supine - Details: Verbal cues for precautions/safety;Verbal cues for sequencing;Tactile cues for weight shifting Transfers Transfers: Yes Sit to Stand: 3: Mod assist;With armrests;With upper extremity assist;From chair/3-in-1 Sit to Stand Details: Verbal cues for precautions/safety;Verbal cues for sequencing;Verbal cues for technique;Manual facilitation for weight shifting Stand to Sit: 3: Mod assist;To chair/3-in-1;To bed;With upper extremity assist;With armrests Stand to Sit Details (indicate cue type and reason): Verbal cues for sequencing;Manual facilitation for weight shifting;Verbal cues for precautions/safety;Verbal cues for technique Stand  Pivot Transfers: 3: Mod assist;With armrests Stand Pivot Transfer Details: Verbal cues for sequencing;Manual facilitation for weight shifting;Verbal cues for precautions/safety;Verbal cues for technique Locomotion  Ambulation Ambulation: Yes Ambulation/Gait Assistance: 4: Min assist Ambulation Distance (Feet): 35 Feet Assistive device: Rolling walker Ambulation/Gait Assistance Details: Verbal cues for gait pattern;Verbal cues for precautions/safety;Visual cues for safe use of DME/AE;Verbal cues for safe use of DME/AE Ambulation/Gait Assistance Details: Patient performed gait training 64' x1 and 7' x1 with RW in controlled environment with minA. Patient requires cues for maintaining BOS within RW, upright posture, and for attention to task as patient continues to talk throughout gait training, distracting her from ability to optimize gait training. Gait Gait: Yes Gait Pattern: Impaired Gait Pattern:  Step-through pattern;Decreased step length - right;Decreased step length - left;Decreased stride length;Trunk flexed;Decreased trunk rotation;Decreased hip/knee flexion - left;Decreased hip/knee flexion - right Stairs / Additional Locomotion Stairs: Yes Stairs Assistance: 4: Min assist Stairs Assistance Details: Verbal cues for sequencing;Verbal cues for precautions/safety;Verbal cues for technique;Visual cues/gestures for sequencing Stair Management Technique: Two rails;Step to pattern;Forwards Number of Stairs: 3 Height of Stairs: 5 Wheelchair Mobility Wheelchair Mobility: Yes Wheelchair Assistance: 4: Advertising account executive Details: Verbal cues for sequencing;Verbal cues for technique;Verbal cues for precautions/safety;Visual cues/gestures for sequencing;Verbal cues for safe use of DME/AE Wheelchair Propulsion: Both lower extermities Wheelchair Parts Management: Needs assistance Distance: 60  Trunk/Postural Assessment  Cervical Assessment Cervical Assessment: Within Functional Limits Thoracic Assessment Thoracic Assessment: Within Functional Limits Lumbar Assessment Lumbar Assessment: Within Functional Limits Postural Control Postural Control: Deficits on evaluation Righting Reactions: delayed Protective Responses: delayed  Balance Balance Balance Assessed: Yes Static Sitting Balance Static Sitting - Balance Support: Bilateral upper extremity supported;No upper extremity supported;Feet supported Static Sitting - Level of Assistance: 5: Stand by assistance Static Standing Balance Static Standing - Balance Support: Bilateral upper extremity supported Static Standing - Level of Assistance: 4: Min assist Extremity Assessment      RLE Assessment RLE Assessment: Exceptions to [facility] RLE Strength RLE Overall Strength: Deficits RLE Overall Strength Comments: Functionally 3/5 RLE Tone RLE Tone: Modified [place];Moderate Modified Ashworth Scale for Grading Hypertonia  RLE: Considerable increase in muschle tone, passive movement difficult RLE Tone Comments: Quadriceps LLE Assessment LLE Assessment: Exceptions to [facility] LLE Strength LLE Overall Strength: Deficits LLE Overall Strength Comments: Functionally 3/5 LLE Tone LLE Tone: Moderate;Modified [place]worth Modified Ashworth Scale for Grading Hypertonia LLE: Considerable increase in muschle tone, passive movement difficult LLE Tone Comments: Quadriceps  FIM:  FIM - Control and instrumentation engineer Devices: Walker;Bed rails;Arm rests Bed/Chair Transfer: 5: Supine > Sit: Supervision (verbal cues/safety issues);3: Bed > Chair or W/C: Mod A (lift or lower assist);4: Chair or W/C > Bed: Min A (steadying Pt. > 75%);4: Sit > Supine: Min A (steadying pt. > 75%/lift 1 leg) FIM - Locomotion: Wheelchair Distance: 60 Locomotion: Wheelchair: 2: Travels 50 - 149 ft with minimal assistance (Pt.>75%) FIM - Locomotion: Ambulation Locomotion: Ambulation Assistive Devices: Administrator Ambulation/Gait Assistance: 4: Min assist Locomotion: Ambulation: 1: Travels less than 50 ft with minimal assistance (Pt.>75%) FIM - Locomotion: Stairs Locomotion: Scientist, physiological: Hand rail - 2 Locomotion: Stairs: 1: Up and Down < 4 stairs with minimal assistance (Pt.>75%)   Refer to Care Plan for Long Term Goals  Recommendations for other services: Neuropsych  Discharge Criteria: Patient will be discharged from PT if patient refuses treatment 3 consecutive times without medical reason, if treatment goals not met, if there is a change in medical status, if patient makes  no progress towards goals or if patient is discharged from hospital.  The above assessment, treatment plan, treatment alternatives and goals were discussed and mutually agreed upon: by patient  Lillia Abed. Duey Liller, PT, DPT 10/22/2013, 3:41 PM

## 2013-10-22 NOTE — H&P (View-Only) (Signed)
Physical Medicine and Rehabilitation Admission H&P    Chief Complaint: weakness, falls : HPI: Katherine Potts is a 53 y.o. 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 Jannifer Franklin diagnosed November of 2014 and also goes to [facility]. Patient lives with her 29 year old son and assistance as needed. She used a walker and needed assistance for activities of daily living prior to admission. Admitted 10/19/2013 with abdominal pain with nausea, diarrhea, vomiting as well as generalized weakness and falls. Patient with progressive decline over the last few months. CT of abdomen and pelvis negative. Subcutaneous Lovenox added for DVT prophylaxis. Diet slowly advanced to regular consistency. The patient ultimately become severely deconditioning with limited mobility.  physical therapy became involved, PM&R was consulted, and it was felt the patient would benefit from an inpatient rehab admission.   ROS Review of Systems  Cardiovascular: Positive for palpitations.  Gastrointestinal: Positive for constipation.  GERD  Musculoskeletal: Positive for back pain, falls and myalgias/spasticity.  Psychiatric/Behavioral: Positive for depression.  Anxiety  All other systems reviewed and are negative  Past Medical History  Diagnosis Date  . GERD (gastroesophageal reflux disease)   . Anxiety   . Depression   . MVP (mitral valve prolapse)   . Cervical spondylosis   . 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 Pneumonia   . Constipation    Past Surgical History  Procedure Laterality Date  . Eye surgery    . Breast surgery    . Vagina surgery      mesh  . Kidney donation      Right nephrectomy  . Abdominal hernia repair    . Orif wrist fracture  07/09/2013    DR Caralyn Guile  . Orif wrist fracture Left 07/09/2013    Procedure: OPEN REDUCTION  INTERNAL FIXATION (ORIF) LEFT  WRIST FRACTURE;  Surgeon: Linna Hoff, MD;  Location: [place];  Service: Orthopedics;  Laterality: Left;   Family History  Problem Relation Age of Onset  . Bipolar disorder Brother   . Dementia Mother   . Hypertension Father   . ALS Cousin    Social History:  reports that she has quit smoking. Her smoking use included Cigarettes and Cigars. She smoked 0.40 packs per day. She has never used smokeless tobacco. She reports that she uses illicit drugs (Marijuana). She reports that she does not drink alcohol. Allergies:  Allergies  Allergen Reactions  . Bactrim [Sulfamethoxazole-Trimethoprim] Nausea Only  . Ciprofloxacin Nausea And Vomiting   Medications Prior to Admission  Medication Sig Dispense Refill  . ARIPiprazole (ABILIFY) 2 MG tablet Take 2 mg by mouth daily.      . calcium carbonate (TUMS - DOSED IN MG ELEMENTAL CALCIUM) 500 MG chewable tablet Chew 1 tablet by mouth daily.      Marland Kitchen dexamethasone (DECADRON) 2 MG tablet Take 2 mg by mouth daily.      . diazepam (VALIUM) 5 MG tablet Take 1 tablet (5 mg total) by mouth every 12 (twelve) hours as needed for muscle spasms.  60 tablet  1  . esomeprazole (NEXIUM) 40 MG capsule Take 40 mg by mouth daily before breakfast.      . FLUoxetine (PROZAC) 20 MG capsule Take 20 mg by mouth daily.      Marland Kitchen LORazepam (ATIVAN) 1 MG tablet Take 2 mg by mouth 3 (three) times daily as  needed for anxiety.      Marland Kitchen LYRICA 150 MG capsule Take 150 mg by mouth 3 (three) times daily.      . methadone (DOLOPHINE) 5 MG tablet Take 5 mg by mouth every 8 (eight) hours as needed for severe pain.       Marland Kitchen ondansetron (ZOFRAN) 4 MG tablet Take 1 tablet (4 mg total) by mouth every 6 (six) hours.  12 tablet  0  . promethazine (PHENERGAN) 25 MG tablet Take 25 mg by mouth every 6 (six) hours as needed for nausea or vomiting.      . vitamin C (ASCORBIC ACID) 500 MG tablet Take 1 tablet (500 mg total) by mouth daily.  50 tablet  0    Home: Home  Living Family/patient expects to be discharged to:: Private residence Living Arrangements: Children Available Help at Discharge: Family;Available 24 hours/day Type of Home: House Home Access: Ramped entrance Home Layout: One level Home Equipment: Walker - 2 wheels;Bedside commode;Transport chair;Shower seat;Adaptive equipment Adaptive Equipment: Feeding equipment   Functional History: Prior Function Level of Independence: Needs assistance Gait / Transfers Assistance Needed: Use of RW with assistance for ambulation - could ambulate in community ADL's / Homemaking Assistance Needed: Bath and dress independently  Functional Status:  Mobility: Bed Mobility Overal bed mobility: Needs Assistance Bed Mobility: Supine to Sit;Sit to Supine Supine to sit: Min assist Sit to supine: Supervision General bed mobility comments: assist with trunk to sit EOB. Cues for technique. Transfers Overall transfer level: Needs assistance Equipment used: Rolling walker (2 wheeled) Transfers: Sit to/from Stand Sit to Stand: Min guard General transfer comment: Min assist for boost from lowest bed setting, Mod assist to safely control descent into reclining chair. VC for hand placement. Leans heavily over walker prior to assuming upright position.  Ambulation/Gait Ambulation/Gait assistance: Min assist Ambulation Distance (Feet): 65 Feet Assistive device: Rolling walker (2 wheeled) Gait Pattern/deviations: Step-to pattern;Step-through pattern;Decreased step length - right;Decreased stride length;Ataxic;Staggering left;Staggering right Gait velocity interpretation: Below normal speed for age/gender General Gait Details: Min assist to maintain balance and stability. Educated on safe DME use with rolling walker. Slightly ataxic with inconsistent foot placement. Pt very fatigued at end of distance with complaints of LLE cramps/spasms. VC for sequencing, especially with turns.    ADL: ADL Overall ADL's : Needs  assistance/impaired Grooming: Wash/dry hands;Oral care;Standing;Minimal assistance Lower Body Dressing: Moderate assistance;Sit to/from stand Toilet Transfer: Minimal assistance;Ambulation;Min guard;RW (bed) Functional mobility during ADLs: Maximal assistance;Rolling walker;Min guard;Minimal assistance General ADL Comments: Recommended pt wear left wrist splint she had in room to keep wrist in neutral position. Educated on safety tips for home (sitting for most of LB ADLs, safe shoewear, use of bag on walker). Pt ambulated to sink to perform some grooming tasks. Educated on AE for LB ADLs-pt tried using sockaid.  Assisted with donning left wrist splint.  Cognition: Cognition Overall Cognitive Status: Within Functional Limits for tasks assessed Orientation Level: Oriented X4 Cognition Arousal/Alertness: Awake/alert Behavior During Therapy: WFL for tasks assessed/performed Overall Cognitive Status: Within Functional Limits for tasks assessed  Physical Exam: Blood pressure 148/77, pulse 57, temperature 99.2 F (37.3 C), temperature source Oral, resp. rate 16, height '5\' 4"'  (1.626 m), weight 63.322 kg (139 lb 9.6 oz), SpO2 99.00%. Physical Exam Constitutional:  53 year old frail female  HENT: oral mucosa pink/moist Head: Normocephalic.  Eyes: EOM are normal.  Neck: Normal range of motion. Neck supple. No thyromegaly present.  Cardiovascular: Normal rate and regular rhythm.  Respiratory: Effort normal  and breath sounds normal. No respiratory distress.  GI: Soft. Bowel sounds are normal. She exhibits no distension.  Neurological:  Reflex Scores:  Tricep reflexes are 3+ on the right side and 3+ on the left side.  Bicep reflexes are 3+ on the right side and 3+ on the left side.  Brachioradialis reflexes are 3+ on the right side and 3+ on the left side.  Patellar reflexes are 3+ on the right side and 3+ on the left side.  Achilles reflexes are 4+ on the right side and 4+ on the left  side. RUE 5/5 in deltoid Bi, tri grip is 3 to 3+  LUE 3- delt, 4- Bi, tri, 0/5 L WE, FE, 2- FF  RLE 4/5 HF, KE, 2- ankle DF  LLE 4/5 HF KE, ADF  Mild facial diplegia  Clonus Bilateral ankles Intact PP/LT in all 4's.  Fair insight and awareness, perhaps a little impulsive Skin: bruise on left eye, a few other scattered bruises on the extremities. Musc: left wrist/hand splint in place, tight wrist Psych: patient pleasant and cooperative  Results for orders placed during the hospital encounter of 10/19/13 (from the past 48 hour(s))  CBC WITH DIFFERENTIAL     Status: Abnormal   Collection Time    10/19/13 12:48 PM      Result Value Ref Range   WBC 10.7 (*) 4.0 - 10.5 K/uL   RBC 3.92  3.87 - 5.11 MIL/uL   Hemoglobin 12.1  12.0 - 15.0 g/dL   HCT 35.8 (*) 36.0 - 46.0 %   MCV 91.3  78.0 - 100.0 fL   MCH 30.9  26.0 - 34.0 pg   MCHC 33.8  30.0 - 36.0 g/dL   RDW 12.3  11.5 - 15.5 %   Platelets 209  150 - 400 K/uL   Neutrophils Relative % 75  43 - 77 %   Neutro Abs 8.0 (*) 1.7 - 7.7 K/uL   Lymphocytes Relative 19  12 - 46 %   Lymphs Abs 2.0  0.7 - 4.0 K/uL   Monocytes Relative 6  3 - 12 %   Monocytes Absolute 0.6  0.1 - 1.0 K/uL   Eosinophils Relative 0  0 - 5 %   Eosinophils Absolute 0.0  0.0 - 0.7 K/uL   Basophils Relative 0  0 - 1 %   Basophils Absolute 0.0  0.0 - 0.1 K/uL  COMPREHENSIVE METABOLIC PANEL     Status: Abnormal   Collection Time    10/19/13 12:48 PM      Result Value Ref Range   Sodium 142  137 - 147 mEq/L   Potassium 3.7  3.7 - 5.3 mEq/L   Chloride 106  96 - 112 mEq/L   CO2 21  19 - 32 mEq/L   Glucose, Bld 99  70 - 99 mg/dL   BUN 20  6 - 23 mg/dL   Creatinine, Ser 1.27 (*) 0.50 - 1.10 mg/dL   Calcium 9.7  8.4 - 10.5 mg/dL   Total Protein 7.0  6.0 - 8.3 g/dL   Albumin 3.9  3.5 - 5.2 g/dL   AST 18  0 - 37 U/L   ALT 20  0 - 35 U/L   Alkaline Phosphatase 61  39 - 117 U/L   Total Bilirubin 0.5  0.3 - 1.2 mg/dL   GFR calc non Af Amer 48 (*) >90 mL/min   GFR calc  Af Amer 55 (*) >90 mL/min   Comment: (NOTE)  The eGFR has been calculated using the CKD EPI equation.     This calculation has not been validated in all clinical situations.     eGFR's persistently <90 mL/min signify possible Chronic Kidney     Disease.   Anion gap 15  5 - 15  LIPASE, BLOOD     Status: Abnormal   Collection Time    10/19/13 12:48 PM      Result Value Ref Range   Lipase 67 (*) 11 - 59 U/L  I-STAT TROPOININ, ED     Status: None   Collection Time    10/19/13  1:05 PM      Result Value Ref Range   Troponin i, poc 0.00  0.00 - 0.08 ng/mL   Comment 3            Comment: Due to the release kinetics of cTnI,     a negative result within the first hours     of the onset of symptoms does not rule out     myocardial infarction with certainty.     If myocardial infarction is still suspected,     repeat the test at appropriate intervals.  URINALYSIS, ROUTINE W REFLEX MICROSCOPIC     Status: Abnormal   Collection Time    10/19/13  2:46 PM      Result Value Ref Range   Color, Urine YELLOW  YELLOW   APPearance CLOUDY (*) CLEAR   Specific Gravity, Urine 1.023  1.005 - 1.030   pH 6.0  5.0 - 8.0   Glucose, UA NEGATIVE  NEGATIVE mg/dL   Hgb urine dipstick NEGATIVE  NEGATIVE   Bilirubin Urine NEGATIVE  NEGATIVE   Ketones, ur 15 (*) NEGATIVE mg/dL   Protein, ur NEGATIVE  NEGATIVE mg/dL   Urobilinogen, UA 0.2  0.0 - 1.0 mg/dL   Nitrite NEGATIVE  NEGATIVE   Leukocytes, UA SMALL (*) NEGATIVE  URINE MICROSCOPIC-ADD ON     Status: Abnormal   Collection Time    10/19/13  2:46 PM      Result Value Ref Range   Squamous Epithelial / LPF FEW (*) RARE   WBC, UA 0-2  <3 WBC/hpf   Bacteria, UA FEW (*) RARE  COMPREHENSIVE METABOLIC PANEL     Status: Abnormal   Collection Time    10/20/13  7:50 AM      Result Value Ref Range   Sodium 142  137 - 147 mEq/L   Potassium 3.1 (*) 3.7 - 5.3 mEq/L   Chloride 109  96 - 112 mEq/L   CO2 19  19 - 32 mEq/L   Glucose, Bld 71  70 - 99 mg/dL    BUN 18  6 - 23 mg/dL   Creatinine, Ser 1.15 (*) 0.50 - 1.10 mg/dL   Calcium 8.1 (*) 8.4 - 10.5 mg/dL   Total Protein 5.5 (*) 6.0 - 8.3 g/dL   Albumin 3.1 (*) 3.5 - 5.2 g/dL   AST 15  0 - 37 U/L   ALT 14  0 - 35 U/L   Alkaline Phosphatase 52  39 - 117 U/L   Total Bilirubin 0.4  0.3 - 1.2 mg/dL   GFR calc non Af Amer 54 (*) >90 mL/min   GFR calc Af Amer 62 (*) >90 mL/min   Comment: (NOTE)     The eGFR has been calculated using the CKD EPI equation.     This calculation has not been validated in all clinical situations.  eGFR's persistently <90 mL/min signify possible Chronic Kidney     Disease.   Anion gap 14  5 - 15  CBC     Status: Abnormal   Collection Time    10/20/13  7:50 AM      Result Value Ref Range   WBC 6.6  4.0 - 10.5 K/uL   RBC 3.36 (*) 3.87 - 5.11 MIL/uL   Hemoglobin 10.9 (*) 12.0 - 15.0 g/dL   HCT 31.7 (*) 36.0 - 46.0 %   MCV 94.3  78.0 - 100.0 fL   MCH 32.4  26.0 - 34.0 pg   MCHC 34.4  30.0 - 36.0 g/dL   RDW 12.4  11.5 - 15.5 %   Platelets 157  150 - 400 K/uL   Comment: REPEATED TO VERIFY  PROTIME-INR     Status: None   Collection Time    10/20/13  7:50 AM      Result Value Ref Range   Prothrombin Time 15.0  11.6 - 15.2 seconds   INR 1.18  0.00 - 1.49  LIPASE, BLOOD     Status: None   Collection Time    10/20/13  7:50 AM      Result Value Ref Range   Lipase 56  11 - 59 U/L  LACTIC ACID, PLASMA     Status: None   Collection Time    10/20/13  7:50 AM      Result Value Ref Range   Lactic Acid, Venous 0.6  0.5 - 2.2 mmol/L  MAGNESIUM     Status: Abnormal   Collection Time    10/20/13  7:50 AM      Result Value Ref Range   Magnesium 1.4 (*) 1.5 - 2.5 mg/dL   Ct Abdomen Pelvis Wo Contrast  10/19/2013   CLINICAL DATA:  Left-sided abdominal pain for 4 days  EXAM: CT ABDOMEN AND PELVIS WITHOUT CONTRAST  TECHNIQUE: Multidetector CT imaging of the abdomen and pelvis was performed following the standard protocol without IV contrast.  COMPARISON:  None.   FINDINGS: Lung bases clear except for 6 mm calcified nodule left lower lobe likely a benign granuloma. Bilateral breast implants noted.  Liver, gallbladder, spleen, pancreas, and adrenal glands normal. Right kidney not identified. Left kidney normal. Probable small splenule left upper quadrant.  Mild calcification of the aorta. Stomach and small bowel normal. Appendix normal. Mild diverticulosis distal descending colon, with large bowel otherwise negative.  Bladder and reproductive organs are normal. No acute musculoskeletal findings. No adenopathy or ascites.  IMPRESSION: No acute abnormalities.   Electronically Signed   By: Skipper Cliche M.D.   On: 10/19/2013 17:59   Dg Wrist Complete Left  10/20/2013   CLINICAL DATA:  LEFT wrist fracture with ORIF on 07/09/2013  EXAM: LEFT WRIST - COMPLETE 3+ VIEW  COMPARISON:  06/19/2013  FINDINGS: Osseous demineralization.  Volar plate and multiple screws identified at the distal LEFT radius post ORIF of the previously identified distal LEFT radial metaphyseal fracture.  Mild obliquity of positioning on lateral view limits assessment.  Nonunion of a previously identified ulnar styloid fracture fragment.  Joint spaces grossly preserved.  No additional fracture or dislocation.  IMPRESSION: Post ORIF of previously identified distal LEFT radial metaphyseal fracture.  Persistent visualization of ulnar styloid fracture fragment.  Osseous demineralization.   Electronically Signed   By: Lavonia Dana M.D.   On: 10/20/2013 12:10       Medical Problem List and Plan: 1. Functional deficits secondary to ALS  with spasticity, left upper extremity and distal right lower extremity. Additional deconditioning after medical issues above 2.  DVT Prophylaxis/Anticoagulation: Lovenox 40 mg daily. Monitor platelet counts and any signs of 3. Pain Management/chronic pain syndrome: Lyrica 150 mg 3 times a day, Methadone 5 mg every 8 hours as needed, oxycodone 10 mg every 4 hours as needed,  Valium 5 mg every 12 hours as needed 4. Mood/depression: Abilify 10 mg daily, Prozac 20 mg daily, Ativan 2 mg 3 times a day as needed. Provide emotional support 5. Neuropsych: This patient is capable of making decisions on her own behalf. 6. Skin/Wound Care: Routine skin care. Patient multiple bruising from falls 7. History of ileus with nonspecific abdominal pain. Continue Bentyl 10 mg 3 times a day as well as protonix twice a day 8. Left wrist fracture April 2015. Continue splint conservative care/weightbearing as tolerated 9. Nausea/GERD---pt to bring in own supply of nexium from home----will dose BID.  -rx nausea prn,   -will work on improved po intake as well, ?megace trial    Post Admission Physician Evaluation: 1. Functional deficits secondary  to ALS and deconditioning. 2. Patient is admitted to receive collaborative, interdisciplinary care between the physiatrist, rehab nursing staff, and therapy team. 3. Patient's level of medical complexity and substantial therapy needs in context of that medical necessity cannot be provided at a lesser intensity of care such as a SNF. 4. Patient has experienced substantial functional loss from his/her baseline which was documented above under the "Functional History" and "Functional Status" headings.  Judging by the patient's diagnosis, physical exam, and functional history, the patient has potential for functional progress which will result in measurable gains while on inpatient rehab.  These gains will be of substantial and practical use upon discharge  in facilitating mobility and self-care at the household level. 5. Physiatrist will provide 24 hour management of medical needs as well as oversight of the therapy plan/treatment and provide guidance as appropriate regarding the interaction of the two. 6. 24 hour rehab nursing will assist with bladder management, bowel management, safety, skin/wound care, disease management, medication administration,  pain management and patient education  and help integrate therapy concepts, techniques,education, etc. 7. PT will assess and treat for/with: Lower extremity strength, range of motion, stamina, balance, functional mobility, safety, adaptive techniques and equipment, NMR, adaptive equipment, orthotics, family/pt education.   Goals are: supervision to mod I. 8. OT will assess and treat for/with: ADL's, functional mobility, safety, upper extremity strength, adaptive techniques and equipment, NMR, adaptive equipment, pain control, orthotics, education.   Goals are: mod i to supervision. 9. SLP will assess and treat for/with: n/a.  Goals are: n/a. 10. Case Management and Social Worker will assess and treat for psychological issues and discharge planning. 11. Team conference will be held weekly to assess progress toward goals and to determine barriers to discharge. 12. Patient will receive at least 3 hours of therapy per day at least 5 days per week. 13. ELOS: 8-13 days       14. Prognosis:  good  Meredith Staggers, MD, [facility]       10/21/2013

## 2013-10-23 ENCOUNTER — Inpatient Hospital Stay (HOSPITAL_COMMUNITY): Payer: Medicare Other | Admitting: Occupational Therapy

## 2013-10-23 ENCOUNTER — Inpatient Hospital Stay (HOSPITAL_COMMUNITY): Payer: Medicare Other

## 2013-10-23 ENCOUNTER — Inpatient Hospital Stay (HOSPITAL_COMMUNITY): Payer: Medicare Other | Admitting: *Deleted

## 2013-10-23 DIAGNOSIS — G1221 Amyotrophic lateral sclerosis: Secondary | ICD-10-CM

## 2013-10-23 DIAGNOSIS — K219 Gastro-esophageal reflux disease without esophagitis: Secondary | ICD-10-CM

## 2013-10-23 DIAGNOSIS — F3289 Other specified depressive episodes: Secondary | ICD-10-CM

## 2013-10-23 DIAGNOSIS — F329 Major depressive disorder, single episode, unspecified: Secondary | ICD-10-CM

## 2013-10-23 DIAGNOSIS — R5381 Other malaise: Secondary | ICD-10-CM

## 2013-10-23 DIAGNOSIS — Z5189 Encounter for other specified aftercare: Secondary | ICD-10-CM

## 2013-10-23 MED ORDER — PREGABALIN 50 MG PO CAPS
150.0000 mg | ORAL_CAPSULE | Freq: Two times a day (BID) | ORAL | Status: DC
  Administered 2013-10-24 – 2013-10-26 (×6): 150 mg via ORAL
  Filled 2013-10-23 (×6): qty 3

## 2013-10-23 MED ORDER — LIDOCAINE 5 % EX PTCH
2.0000 | MEDICATED_PATCH | CUTANEOUS | Status: DC
  Administered 2013-10-23 – 2013-11-03 (×12): 2 via TRANSDERMAL
  Filled 2013-10-23 (×14): qty 2

## 2013-10-23 NOTE — Progress Notes (Signed)
Physical Therapy Session Note  Patient Details  Name: Katherine Potts MRN: [id number] Date of Birth: [birth year]  Today's Date: 10/23/2013 PT Individual Time: 4098-1191 PT Individual Time Calculation (min): 31 min   Short Term Goals: Week 1:  PT Short Term Goal 1 (Week 1): Patient will perform bed mobility with supervision. PT Short Term Goal 2 (Week 1): Patient will perform functional transfers with minA. PT Short Term Goal 3 (Week 1): Patient will perform gait training 100' x1 with minA.  Skilled Therapeutic Interventions/Progress Updates:    Patient received sitting in wheelchair. Session focused on increasing activity tolerance with functional transfers and gait training. Patient performing all functional mobility at [facility] level. Functional ambulation 85' x1 with RW and minA, cues for maintaining BOS within RW.  Added L hand orthosis to RW to improve grip on RW during ambulation.  NuStep Level 3 x8' with B LE and R UE (L shoulder painful and so did not use). Patient with poor memory within session, almost perseverative, repeatedly talking about pharmacy consult and medication recommendations. Patient returned to room and left sitting in wheelchair with brother and father present.  Therapy Documentation Precautions:  Precautions Precautions: Fall Precaution Comments: Recent increase in numbers of falls Required Braces or Orthoses: Other Brace/Splint Other Brace/Splint: Left wrist cock-up splint while up; resting hand splint @ night to prevent contractures Restrictions Weight Bearing Restrictions: No Pain: Pain Assessment Pain Assessment: 0-10 Pain Score: 4  Pain Type: Chronic pain Pain Location: Back Pain Orientation: Lower Pain Descriptors / Indicators: Constant;Aching Pain Onset: On-going Pain Intervention(s): RN made aware;Repositioned;Ambulation/increased activity Multiple Pain Sites: No Locomotion : Ambulation Ambulation/Gait Assistance: 4: Min assist   See FIM  for current functional status  Therapy/Group: Individual Therapy  Chipper Herb. Tearra Ouk, PT, DPT 10/23/2013, 4:25 PM

## 2013-10-23 NOTE — Progress Notes (Signed)
Occupational Therapy Session Note  Patient Details  Name: KEYANA GUEVARA MRN: [id number] Date of Birth: July 30, 1960  Today's Date: 10/23/2013 OT Individual Time: 4098-1191 OT Individual Time Calculation (min): 30 min    Short Term Goals: Week 1:  OT Short Term Goal 1 (Week 1): Patient will complete lower body bathing with min assist OT Short Term Goal 2 (Week 1): Patient will dress lower body at edge of bed with mod assist OT Short Term Goal 3 (Week 1): Patient will demo ability to complete HEP for improved [facility] of left hand OT Short Term Goal 4 (Week 1): Patient will complete 4 of 4 grooming tasks with min assist   Skilled Therapeutic Interventions/Progress Updates:    Engaged in therapeutic activity with focus on functional use of LUE and sit <> stand as needed for LB self-care tasks.  Educated pt on various exercises for LUE to increase [facility] with use of towel scrunches and thumb opposition.  Pt requested assist to remove cap from chapstick, educated pt on use of Lt hand as gross assist to use thumb and first finger to stabilize chapstick while removing cap with Rt hand.  Pt donned and doffed lid of chapstick x3 to promote tip to tip pinch and increase functional use of LUE.  Engaged in sit > stand x2 with focus on appropriate hand placement to increase safety and increase tolerance of standing as needed with LB dressing.    Therapy Documentation Precautions:  Precautions Precautions: Fall Precaution Comments: Recent increase in numbers of falls Required Braces or Orthoses: Other Brace/Splint Other Brace/Splint: Left wrist cock-up splint while up; resting hand splint @ night to prevent contractures Restrictions Weight Bearing Restrictions: No Pain: Pain Assessment Pain Assessment: No/denies pain ADL: ADL ADL Comments: see FIM  See FIM for current functional status  Therapy/Group: Individual Therapy  Rosalio Loud 10/23/2013, 12:32 PM

## 2013-10-23 NOTE — Progress Notes (Signed)
Occupational Therapy Session Note  Patient Details  Name: Katherine Potts MRN: [id number] Date of Birth: 08/19/1960  Today's Date: 10/23/2013 OT Individual Time: 1000-1101 OT Individual Time Calculation (min): 61 min    Short Term Goals: Week 1:  OT Short Term Goal 1 (Week 1): Patient will complete lower body bathing with min assist OT Short Term Goal 2 (Week 1): Patient will dress lower body at edge of bed with mod assist OT Short Term Goal 3 (Week 1): Patient will demo ability to complete HEP for improved [facility] of left hand OT Short Term Goal 4 (Week 1): Patient will complete 4 of 4 grooming tasks with min assist   Skilled Therapeutic Interventions/Progress Updates: ADL-retraining with focus on improved lower body bathing, dressing, and transfers skills reinforcement training.   Pt received supine in bed awaiting therapist stating she felt well-rested and was now content with provider and RN care.    Pt completed bed mobility with mod assist to rise to edge of bed and steadying assist to transfer to RW.   Pt requested additional time for functional mobility during this session in hopes of provoking BM through change of position.   Pt ambulated approx 30' (room + hall) and returned to room to complete shower and dress, as her son brought some clothing from home for her to wear.   Pt bathed seated on bench, toileted, and groomed at sink with overall min assist this session + extra time and min instructional cues to move closer to grab bars during transfers in bathroom.        Therapy Documentation Precautions:  Precautions Precautions: Fall Precaution Comments: Recent increase in numbers of falls Required Braces or Orthoses: Other Brace/Splint Other Brace/Splint: Left wrist cock-up splint while up; resting hand splint @ night to prevent contractures Restrictions Weight Bearing Restrictions: No  Pain: Pain Assessment Pain Assessment: No/denies pain  ADL: ADL ADL Comments: see  FIM  See FIM for current functional status  Therapy/Group: Individual Therapy  Second session: Time: 1300-1400 Time Calculation (min):  60 min  Pain Assessment: No/denies pain  Skilled Therapeutic Interventions: ADL-retraining (15 min) with focus on improved bed mobility, functional transfer, seated grooming, fine motor control of BUE.   Pt received supine in bed seeking rest with RN present.  With minimal remotivational interviewing, pt agreed to planned activities to include grooming (oral care), make-up, and therapeutic activity in prep for planned visit from her father.   Pt rose to edge of bed using only bed rail and then stood at RW with steadying assist and instructional cue to scoot to edge of bed prior to standing.   Pt ambulated to w/c with steadying assist and performed grooming at sink, using right hand to brush her teeth (after setup) and to apply lipstick.   RN assisted with eyeliner.    Pt then requested inspection of family room while performing functional mobility at w/c level, pulling on handrails in hallway as instucted to assist with self-propulsion of w/c.   Pt explored use of interent and observed furnished setting as ideal for discussions with her father.    Pt then requested inspection of gift shop for purchase of additional toiletries needed for ADL.   From w/c placed at entry, pt ambulated with RW to counter and down one aisle before fatigued (approx 8 minutes total time standing and/or walking).   Pt was escorted through gift shop to assess cost of items needed and was escorted back to her room to recover  back in bed (min assist required for bed mobility d/t fatigue).       See FIM for current functional status  Therapy/Group: Individual Therapy  Labradford Schnitker 10/23/2013, 12:29 PM

## 2013-10-23 NOTE — IPOC Note (Signed)
Overall Plan of Care [facility]) Patient Details Name: Katherine Potts MRN: [id number] DOB: 1960-08-19  Admitting Diagnosis: ALS with ALSwith spasticity  weakness   Hospital Problems: Active Problems:   ALS (amyotrophic lateral sclerosis)     Functional Problem List: Nursing Bowel;Medication Management;Skin Integrity;Safety;Pain  PT Balance;Behavior;Endurance;Motor;Pain;Safety  OT Behavior;Balance;Cognition;Endurance;Motor;Safety  SLP    TR         Basic ADL's: OT Eating;Grooming;Bathing;Dressing;Toileting     Advanced  ADL's: OT       Transfers: PT Bed Mobility;Bed to Chair;Car;Furniture;Floor  OT       Locomotion: PT Ambulation;Wheelchair Mobility     Additional Impairments: OT Fuctional Use of Upper Extremity (impaired LUE function (wrist, fingers, thumb))  SLP        TR      Anticipated Outcomes Item Anticipated Outcome  Self Feeding Mod I  Swallowing      Basic self-care  Min A  Toileting  Mod I   Bathroom Transfers Supervision  Bowel/Bladder  LBM 8/24 cont of bowel and bladder  Transfers  supervision  Locomotion  supervision  Communication     Cognition     Pain  Pain level less than 4  Safety/Judgment  Supervision   Therapy Plan: PT Intensity: Minimum of 1-2 x/day ,45 to 90 minutes PT Frequency: 5 out of 7 days PT Duration Estimated Length of Stay: 12-14 days OT Intensity: Minimum of 1-2 x/day, 45 to 90 minutes OT Frequency: 5 out of 7 days OT Duration/Estimated Length of Stay: 12-14 days         Team Interventions: Nursing Interventions Patient/Family Education;Bowel Management;Pain Management;Medication Management;Skin Care/Wound Management  PT interventions Ambulation/gait training;Disease management/prevention;Pain management;Stair training;Wheelchair propulsion/positioning;Therapeutic Activities;Patient/family education;DME/adaptive equipment instruction;Balance/vestibular training;Cognitive remediation/compensation;Psychosocial  support;Therapeutic Exercise;UE/LE Strength taining/ROM;Functional mobility training;Community reintegration;Discharge planning;Neuromuscular re-education;Splinting/orthotics;UE/LE Coordination activities  OT Interventions Discharge planning;Functional mobility training;Patient/family education;Self Care/advanced ADL retraining;Therapeutic Exercise;Therapeutic Activities;UE/LE Coordination activities;UE/LE Strength taining/ROM  SLP Interventions    TR Interventions    SW/CM Interventions Discharge Planning;Psychosocial Support;Patient/Family Education    Team Discharge Planning: Destination: PT-Home ,OT- Home , SLP-  Projected Follow-up: PT-Home health PT;24 hour supervision/assistance, OT-  Home health OT, SLP-  Projected Equipment Needs: PT-To be determined, OT- None recommended by OT, SLP-  Equipment Details: PT-Patient owns RW, W/C, BSC, shower chair; further DME TBD upon discharge, OT-has ramp, w/c, shower seat (step in shower), BSC already Patient/family involved in discharge planning: PT- Patient,  OT-Patient, SLP-   MD ELOS: 12-14 days Medical Rehab Prognosis:  Good Assessment: The patient has been admitted for CIR therapies with the diagnosis of ALS/deconditioning. The team will be addressing functional mobility, strength, stamina, balance, safety, adaptive techniques and equipment, self-care, bowel and bladder mgt, patient and caregiver education, NMR, pain mgt, egosupport/depression, visual spatial awareness, cognitive perceptual awareness. . Goals have been set at supervision for basic transfers and ADL's and min assist for self-care. Pysch, ?cognitive issues also playing a role.    Ranelle Oyster, MD, FAAPMR      See Team Conference Notes for weekly updates to the plan of care

## 2013-10-23 NOTE — Progress Notes (Signed)
[place] PHYSICAL MEDICINE & REHABILITATION     PROGRESS NOTE    Subjective/Complaints: Survived therapy yesterday. Distracted per team reports. Pain under fair control. kpad helped back. Appreciated xrays done for back  Objective: Vital Signs: Blood pressure 117/91, pulse 82, temperature 98.5 F (36.9 C), temperature source Oral, resp. rate 18, height  (1.626 m), weight 65.4 kg (144 lb 2.9 oz), SpO2 95.00%. Dg Cervical Spine 2 Or 3 Views  10/22/2013   CLINICAL DATA:  Back pain, shoulder pain  EXAM: CERVICAL SPINE - 2-3 VIEW  COMPARISON:  12/05/2012  FINDINGS: Three views of cervical spine submitted. No acute fracture or subluxation. There is disc space flattening with anterior spurring at C4-C5, C5-C6 and C6-C7 level. C7 vertebral body is poorly visualized. No prevertebral soft tissue swelling. Mild degenerative changes C1-C2 articulation. No prevertebral soft tissue swelling. Cervical airway is patent.  IMPRESSION: No acute fracture or subluxation. Osteoarthritic changes as described above.   Electronically Signed   By: Natasha Mead M.D.   On: 10/22/2013 12:42   Dg Thoracic Spine 2 View  10/22/2013   CLINICAL DATA:  Back pain, fall  EXAM: THORACIC SPINE - 2 VIEW  COMPARISON:  None.  FINDINGS: Three views of thoracic spine submitted. No acute fracture or subluxation. Mild degenerative changes with anterior spurring mid and lower thoracic spine.  IMPRESSION: No acute fracture or subluxation. Mild degenerative changes mid and lower thoracic spine.   Electronically Signed   By: Natasha Mead M.D.   On: 10/22/2013 12:38    Recent Labs  10/21/13 2052 10/22/13 0620  WBC 8.8 8.8  HGB 11.8* 12.1  HCT 34.0* 35.7*  PLT 180 190    Recent Labs  10/21/13 2052 10/22/13 0620  NA  --  142  K  --  3.9  CL  --  108  GLUCOSE  --  99  BUN  --  14  CREATININE 1.26* 1.27*  CALCIUM  --  9.1   CBG (last 3)  No results found for this basename: GLUCAP,  in the last 72 hours  Wt Readings from  Last 3 Encounters:  10/23/13 65.4 kg (144 lb 2.9 oz)  10/20/13 63.322 kg (139 lb 9.6 oz)  07/09/13 65.772 kg (145 lb)    Physical Exam:  53 year old frail female  HENT: oral mucosa pink/moist  Head: Normocephalic.  Eyes: EOM are normal.  Neck: Normal range of motion. Neck supple. No thyromegaly present.  Cardiovascular: Normal rate and regular rhythm.  Respiratory: Effort normal and breath sounds normal. No respiratory distress.  GI: Soft. Bowel sounds are normal. She exhibits no distension.  Neurological:  Reflex Scores:  Tricep reflexes are 3+ on the right side and 3+ on the left side.  Bicep reflexes are 3+ on the right side and 3+ on the left side.  Brachioradialis reflexes are 3+ on the right side and 3+ on the left side.  Patellar reflexes are 3+ on the right side and 3+ on the left side.  Achilles reflexes are 4+ on the right side and 4+ on the left side.  RUE 5/5 in deltoid Bi, tri grip is 3 to 3+  LUE 3- delt, 4- Bi, tri, 0/5 L WE, FE, 2- FF  RLE 4/5 HF, KE, 2- ankle DF  LLE 4/5 HF KE, ADF   Mild facial diplegia persistent  Clonus Bilateral ankles  Intact PP/LT in all 4's.  Fair insight and awareness, perhaps a little impulsive  Skin: bruise around right eye,  a few other scattered bruises on the extremities.  Musc: left wrist/hand splint in place, tight wrist  Psych: patient pleasant and cooperative, a little anxious/impulsive   Assessment/Plan: 1. 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 Physiatrist is providing close team supervision and 24 hour management of active medical problems listed below. Physiatrist and rehab team continue to assess barriers to discharge/monitor patient progress toward functional and medical goals. FIM: FIM - Bathing Bathing Steps Patient Completed: Chest;Right Arm;Left Arm;Abdomen;Front perineal  area;Right upper leg;Left upper leg Bathing: 3: Mod-Patient completes 5-7 68f 10 parts or 50-74%  FIM - Upper Body Dressing/Undressing Upper body dressing/undressing: 0: Wears gown/pajamas-no public clothing FIM - Lower Body Dressing/Undressing Lower body dressing/undressing: 0: Wears gown/pajamas-no public clothing  FIM - Toileting Toileting steps completed by patient: Performs perineal hygiene Toileting: 3: Mod-Patient completed 2 of 3 steps  FIM - Diplomatic Services operational officer Devices: Grab bars Toilet Transfers: 4-To toilet/BSC: Min A (steadying Pt. > 75%);4-From toilet/BSC: Min A (steadying Pt. > 75%)  FIM - Banker Devices: Walker;Bed rails;Arm rests Bed/Chair Transfer: 5: Supine > Sit: Supervision (verbal cues/safety issues);3: Bed > Chair or W/C: Mod A (lift or lower assist);4: Chair or W/C > Bed: Min A (steadying Pt. > 75%);4: Sit > Supine: Min A (steadying pt. > 75%/lift 1 leg)  FIM - Locomotion: Wheelchair Distance: 60 Locomotion: Wheelchair: 2: Travels 50 - 149 ft with minimal assistance (Pt.>75%) FIM - Locomotion: Ambulation Locomotion: Ambulation Assistive Devices: Designer, industrial/product Ambulation/Gait Assistance: 4: Min assist Locomotion: Ambulation: 1: Travels less than 50 ft with minimal assistance (Pt.>75%)  Comprehension Comprehension Mode: Auditory Comprehension: 4-Understands basic 75 - 89% of the time/requires cueing 10 - 24% of the time  Expression Expression Mode: Verbal Expression: 3-Expresses basic 50 - 74% of the time/requires cueing 25 - 50% of the time. Needs to repeat parts of sentences.  Social Interaction Social Interaction: 4-Interacts appropriately 75 - 89% of the time - Needs redirection for appropriate language or to initiate interaction.  Problem Solving Problem Solving: 4-Solves basic 75 - 89% of the time/requires cueing 10 - 24% of the time  Memory Memory: 4-Recognizes or recalls 75 - 89%  of the time/requires cueing 10 - 24% of the time  Medical Problem List and Plan:  1. 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 Additional deconditioning after medical issues above  2. DVT Prophylaxis/Anticoagulation: Lovenox 40 mg daily. Monitor platelet counts and any signs of  3. Pain Management/chronic pain syndrome: Lyrica 150 mg 3 times a day, Methadone 5 mg every 8 hours as needed (home dose), oxycodone 10 mg every 4 hours as needed, Valium 5 mg every 12 hours as needed   -add lidoderm patch for back  -k pad  -decreased decadron?  -xrays of c and t spine without acute findings 4. Mood/depression: Abilify 10 mg daily, Prozac 20 mg daily, Ativan 2 mg 3 times a day as needed. Provide emotional support  5. Neuropsych: This patient is capable of making decisions on her own behalf.  6. Skin/Wound Care: Routine skin care. Patient multiple bruises from falls  7. History of ileus with nonspecific abdominal pain. Continue Bentyl 10 mg 3 times a day as well as nexium  8. Left wrist fracture April 2015. Continue splint conservative care/weightbearing as tolerated  9. Nausea/GERD---nexium bid  -rx nausea  prn,  -monitor po intake, encouraged her to eat food from home if she wishes.   LOS (Days) 2 A FACE TO FACE EVALUATION WAS PERFORMED  Ria Redcay T 10/23/2013 8:10 AM

## 2013-10-23 NOTE — Progress Notes (Signed)
Physical Therapy Session Note  Patient Details  Name: Katherine Potts MRN: [id number] Date of Birth: Dec 23, 1960  Today's Date: 10/23/2013 PT Individual Time: 1500-1530 PT Individual Time Calculation (min): 30 min   Short Term Goals: Week 1:  PT Short Term Goal 1 (Week 1): Patient will perform bed mobility with supervision. PT Short Term Goal 2 (Week 1): Patient will perform functional transfers with minA. PT Short Term Goal 3 (Week 1): Patient will perform gait training 100' x1 with minA.  Skilled Therapeutic Interventions/Progress Updates:  1:1. Pt received semi-reclined in bed, ready for therapy. Focus this session on sustained attention, functional transfers and functional ambulation. Pt req min cues for sustained attention to tasks throughout session. Pt able to t/f sup>sit EOB w/ use of hospital bed functions and supervision. Pt req min A for ambulation in room to bathroom w/ min A for toilet t/f and mod A for toileting. Pt transported to therapy apartment via w/c. Pt req min A for ambulation 15'x2 w/ RW, supervision for t/f sup<>sit on standard bed and min A for furniture transfers. Pt left sitting in w/c at end of session w/ all needs in reach.   Therapy Documentation Precautions:  Precautions Precautions: Fall Precaution Comments: Recent increase in numbers of falls Required Braces or Orthoses: Other Brace/Splint Other Brace/Splint: Left wrist cock-up splint while up; resting hand splint @ night to prevent contractures Restrictions Weight Bearing Restrictions: No  See FIM for current functional status  Therapy/Group: Individual Therapy  Denzil Hughes 10/23/2013, 3:34 PM

## 2013-10-24 ENCOUNTER — Inpatient Hospital Stay (HOSPITAL_COMMUNITY): Payer: Medicare Other

## 2013-10-24 DIAGNOSIS — G1221 Amyotrophic lateral sclerosis: Secondary | ICD-10-CM

## 2013-10-24 DIAGNOSIS — Z5189 Encounter for other specified aftercare: Principal | ICD-10-CM

## 2013-10-24 MED ORDER — METHOCARBAMOL 500 MG PO TABS
500.0000 mg | ORAL_TABLET | Freq: Three times a day (TID) | ORAL | Status: DC
  Administered 2013-10-24 – 2013-10-27 (×12): 500 mg via ORAL
  Filled 2013-10-24 (×18): qty 1

## 2013-10-24 MED ORDER — FLEET ENEMA 7-19 GM/118ML RE ENEM
1.0000 | ENEMA | Freq: Every day | RECTAL | Status: DC | PRN
  Administered 2013-10-25 – 2013-10-29 (×2): 1 via RECTAL
  Filled 2013-10-24 (×2): qty 1

## 2013-10-24 MED ORDER — MAGNESIUM HYDROXIDE 400 MG/5ML PO SUSP
15.0000 mL | Freq: Every day | ORAL | Status: DC | PRN
Start: 2013-10-24 — End: 2013-11-03
  Administered 2013-10-25 – 2013-10-29 (×3): 15 mL via ORAL
  Filled 2013-10-24 (×3): qty 30

## 2013-10-24 MED ORDER — SENNA 8.6 MG PO TABS
2.0000 | ORAL_TABLET | Freq: Every day | ORAL | Status: DC
  Administered 2013-10-24 – 2013-11-02 (×10): 17.2 mg via ORAL
  Filled 2013-10-24 (×16): qty 2

## 2013-10-24 MED ORDER — BISACODYL 10 MG RE SUPP
10.0000 mg | Freq: Every day | RECTAL | Status: DC | PRN
Start: 2013-10-24 — End: 2013-11-03
  Administered 2013-10-24 – 2013-10-28 (×3): 10 mg via RECTAL
  Filled 2013-10-24 (×2): qty 1

## 2013-10-24 NOTE — Progress Notes (Addendum)
Physical Therapy Session Note  Patient Details  Name: Katherine Potts MRN: [id number] Date of Birth: 12-Oct-1960  Today's Date: 10/24/2013 PT Individual Time: 4098-1191 PT Individual Time Calculation (min): 60 min   Short Term Goals: Week 1:  PT Short Term Goal 1 (Week 1): Patient will perform bed mobility with supervision. PT Short Term Goal 2 (Week 1): Patient will perform functional transfers with minA. PT Short Term Goal 3 (Week 1): Patient will perform gait training 100' x1 with minA.  Skilled Therapeutic Interventions/Progress Updates:    Pt demonstrates increased safety using LUE orthosis when using RW at this time. Pt very motivated to improve mobility with therapy services occasionally benefiting from cues for pacing. Pt with difficulty sequencing stairs able to carry over inconsistently afterwards. Pt would continue to benefit from skilled PT services to increase functional mobility.  Therapy Documentation Precautions:  Precautions Precautions: Fall Precaution Comments: Recent increase in numbers of falls Required Braces or Orthoses: Other Brace/Splint Other Brace/Splint: Left wrist cock-up splint while up; resting hand splint @ night to prevent contractures Restrictions Weight Bearing Restrictions: No Pain: Pain Assessment Pain Assessment: 0-10 Pain Score: 5  Pain Type: Chronic pain Pain Location: Leg Pain Orientation: Left;Right Pain Descriptors / Indicators: Tightness Pain Onset: On-going Pain Intervention(s): Medication (See eMAR) Mobility:   Min A transfers with cues for safety and attention Locomotion : Ambulation Ambulation/Gait Assistance: 4: Min assist 75'x2, 50'x1 with cues for pacing and AD; Stairs 5 steps with cues for sequencing Min A with B/L rails Balance: Static Standing Balance Static Standing - Balance Support: Bilateral upper extremity supported Static Standing - Level of Assistance: 4: Min assist Dynamic Standing Balance Dynamic Standing -  Balance Support: Bilateral upper extremity supported Dynamic Standing - Level of Assistance: 4: Min assist Dynamic Standing - Balance Activities: Reaching for objects;Forward lean/weight shifting;Lateral lean/weight shifting Dynamic Standing - Comments: including marching, hip abd, heel raises, hip ext 2x5 Other Treatments:   Pt performs standing balance activities as above. Pt performs nu-step x8'. Pt educated on pacing activities, safety in mobility, and rehab plan.  See FIM for current functional status  Therapy/Group: Individual Therapy  Christia Reading 10/24/2013, 10:38 AM

## 2013-10-24 NOTE — Progress Notes (Signed)
Patient ID: Katherine Potts, female   DOB: 04-15-1960, 53 y.o.   MRN: [id number]   [place] PHYSICAL MEDICINE & REHABILITATION     PROGRESS NOTE   10/24/13.  Subjective/Complaints:  53 y/o admit for CIR with functional deficits secondary to deconditioning in the setting of baseline ALS Continues to have considerable muscle spasm and requesting robaxin; feels bentyl may be aggravating constipation  Past Medical History  Diagnosis Date  . GERD (gastroesophageal reflux disease)   . Anxiety   . Depression   . MVP (mitral valve prolapse)   . Cervical spondylosis   . 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 Pneumonia   . Constipation      Objective: Vital Signs: Blood pressure 99/68, pulse 77, temperature 97.8 F (36.6 C), temperature source Oral, resp. rate 18, height  (1.626 m), weight 65.4 kg (144 lb 2.9 oz), SpO2 97.00%.  Recent Labs  10/21/13 2052 10/22/13 0620  WBC 8.8 8.8  HGB 11.8* 12.1  HCT 34.0* 35.7*  PLT 180 190    Recent Labs  10/21/13 2052 10/22/13 0620  NA  --  142  K  --  3.9  CL  --  108  GLUCOSE  --  99  BUN  --  14  CREATININE 1.26* 1.27*  CALCIUM  --  9.1   CBG (last 3)  No results found for this basename: GLUCAP,  in the last 72 hours  Wt Readings from Last 3 Encounters:  10/23/13 65.4 kg (144 lb 2.9 oz)  10/20/13 63.322 kg (139 lb 9.6 oz)  07/09/13 65.772 kg (145 lb)    Intake/Output Summary (Last 24 hours) at 10/24/13 0846 Last data filed at 10/23/13 2246  Gross per 24 hour  Intake    720 ml  Output      1 ml  Net    719 ml    Patient Vitals for the past 24 hrs:  BP Temp Temp src Pulse Resp SpO2  10/24/13 0553 99/68 mmHg 97.8 F (36.6 C) Oral 77 18 97 %  10/23/13 2133 121/86 mmHg 97.8 F (36.6 C) Oral 87 18 96 %  10/23/13 1417 120/82 mmHg 98.3 F (36.8 C) Oral 84 17 99 %    Physical Exam:  53 year old frail female  HENT: oral mucosa pink/moist  Head: Normocephalic.  Eyes: EOM are  normal.  Neck: Normal range of motion. Neck supple. No thyromegaly present.  Cardiovascular: Normal rate and regular rhythm.  Respiratory: Effort normal and breath sounds normal. No respiratory distress.  GI: Soft. Bowel sounds are normal. She exhibits no distension.  Neurological:  Reflex Scores:  Tricep reflexes are 3+ on the right side and 3+ on the left side.  Bicep reflexes are 3+ on the right side and 3+ on the left side.  Brachioradialis reflexes are 3+ on the right side and 3+ on the left side.  Patellar reflexes are 3+ on the right side and 3+ on the left side.  Achilles reflexes are 4+ on the right side and 4+ on the left side.  RUE 5/5 in deltoid Bi, tri grip is 3 to 3+  LUE 3- delt, 4- Bi, tri, 0/5 L WE, FE, 2- FF  RLE 4/5 HF, KE, 2- ankle DF  LLE 4/5 HF KE, ADF   Mild facial diplegia persistent  Clonus Bilateral ankles  Intact PP/LT in all 4's.  Psych: patient pleasant and cooperative, a little anxious/impulsive   Assessment/Plan: 1. Functional  deficits secondary to deconditioning in the setting of baseline ALS which require 3+ hours per day of interdisciplinary therapy in a comprehensive inpatient rehab setting. 2. DVT Prophylaxis/Anticoagulation: Lovenox 40 mg daily. Monitor platelet counts and any signs of  3. Pain Management/chronic pain syndrome: Lyrica 150 mg 3 times a day, Methadone 5 mg every 8 hours as needed (home dose), oxycodone 10 mg every 4 hours as needed, Valium 5 mg every 12 hours as needed   Trial robaxin 4. Mood/depression: Abilify 10 mg daily, Prozac 20 mg daily, Ativan 2 mg 3 times a day as needed. Provide emotional support  5. Neuropsych: This patient is capable of making decisions on her own behalf.  6. Skin/Wound Care: Routine skin care. Patient multiple bruises from falls  7. History of ileus with nonspecific abdominal pain. Continue Bentyl 10 mg 3 times a day as well as nexium  8. Left wrist fracture April 2015. Continue splint conservative  care/weightbearing as tolerated  9. Nausea/GERD---nexium bid  -rx nausea prn,  -monitor po intake, encouraged her to eat food from home if she wishes.   LOS (Days) 3 A FACE TO FACE EVALUATION WAS PERFORMED  Rogelia Boga 10/24/2013 8:42 AM

## 2013-10-24 NOTE — Progress Notes (Signed)
Patient states that she had a consult with the Pharmacist and patient has now requested that Robaxin be added to her PRN medications and wants Sena kot added also. Diamantina Monks, RN

## 2013-10-24 NOTE — Progress Notes (Signed)
Occupational Therapy Session Note  Patient Details  Name: Katherine Potts MRN: [id number] Date of Birth: Dec 29, 1960  Today's Date: 10/24/2013 OT Individual Time: 1100-1205 OT Individual Time Calculation (min): 65 min    Short Term Goals: Week 1:  OT Short Term Goal 1 (Week 1): Patient will complete lower body bathing with min assist OT Short Term Goal 2 (Week 1): Patient will dress lower body at edge of bed with mod assist OT Short Term Goal 3 (Week 1): Patient will demo ability to complete HEP for improved [facility] of left hand OT Short Term Goal 4 (Week 1): Patient will complete 4 of 4 grooming tasks with min assist   Skilled Therapeutic Interventions/Progress Updates: Therapeutic activity with focus on improved gross movement of LUE and FMC of left and right hand, dynamic standing balance, and sit<>stand, and increased activity tolerance.   Pt received supine in bed and was assisted to w/c to engage in 2 games of GoBan standing supported (alternating left and right UE for support) for 12 minutes (continuous) at rehab gym.   Pt able to maintain standing balance while crossing midline and used LUE as gross assist to collect game pieces with min facilitation for left shoulder flexion.   Pt demo improved left elbow extension during game play and improved functional use of right hand as evidenced by independently grasping and placing game pieces 30 times during game play.   Following standing task, pt complete 30 min of initial HEP instruction to improve left wrist pron/supination and flex/extension with improvement noted with increased PROM at supination (from 0 degree to 30 degrees after prolonged passive stretch.  Pt returned to her room in w/c with OT providing setup assist for self-feeding for noon meal.       Therapy Documentation Precautions:  Precautions Precautions: Fall Precaution Comments: Recent increase in numbers of falls Required Braces or Orthoses: Other Brace/Splint Other  Brace/Splint: Left wrist cock-up splint while up; resting hand splint @ night to prevent contractures Restrictions Weight Bearing Restrictions: No  ADL: ADL ADL Comments: see FIM  See FIM for current functional status  Therapy/Group: Individual Therapy  Second session: Time: 1300-1400 Time Calculation (min):  60 min  Pain Assessment: No/denies pain  Skilled Therapeutic Interventions: ADL-retraining with focus on functional transfers, symptom management (constipation), and functional use of LUE at diminished level.  Pt performed toileting to include clothing management and hygiene with standby assist enhanced by improved use of left hand to grasp grab bar while performing partial stand to wipe, posterior to anterior approach after voiding urine.   Pt also used left hand at diminished level to hold toilet paper while pulling paper off roll with right hand.   Pt progressed to seated bathing with only steadying assist required during transfer and while standing to wash buttocks, holding grab bar with left hand while reaching back to clean with right hand.   Pt recovered to w/c after bathing and dressed sitting/standing at w/c placed by sink with mod assist to pull up underwear and pants.   With 15 minutes remaining in treatment time, pt requested visit to gift shop to purchase hair brush and 2 gifts at w/c level.   Pt completed transaction with min assist to calculate expenses and retrieve currency from her purse for purchase. Pt returned to room and to bed at end of session with min assist to complete transfer and lift 1 leg into bed; call light and phone placed within reach.   See FIM for current  functional status  Therapy/Group: Individual Therapy  Katherine Potts 10/24/2013, 2:38 PM

## 2013-10-24 NOTE — Progress Notes (Signed)
Patient stated that she did not want to take Bentyl any longer after she looked up the side effects.  Diamantina Monks, RN

## 2013-10-24 NOTE — Plan of Care (Signed)
Problem: RH BOWEL ELIMINATION Goal: RH STG MANAGE BOWEL WITH ASSISTANCE STG Manage Bowel with minimal Assistance.  Outcome: Not Progressing LBM 10/19/13 Goal: RH STG MANAGE BOWEL W/MEDICATION W/ASSISTANCE STG Manage Bowel with Medication with Mod I  Outcome: Not Progressing LBM 10/19/13

## 2013-10-24 NOTE — Progress Notes (Signed)
Patient reported some numbness to tongue and lips this morning. MD Amador Cunas notified and assessed the patient. No new orders received. Patient reports is getting better. Continue to monitor.

## 2013-10-25 ENCOUNTER — Inpatient Hospital Stay (HOSPITAL_COMMUNITY): Payer: Medicare Other

## 2013-10-25 DIAGNOSIS — F3289 Other specified depressive episodes: Secondary | ICD-10-CM

## 2013-10-25 DIAGNOSIS — F329 Major depressive disorder, single episode, unspecified: Secondary | ICD-10-CM

## 2013-10-25 MED ORDER — METHADONE HCL 5 MG PO TABS
5.0000 mg | ORAL_TABLET | Freq: Three times a day (TID) | ORAL | Status: DC
  Administered 2013-10-25 – 2013-10-31 (×19): 5 mg via ORAL
  Filled 2013-10-25 (×21): qty 1

## 2013-10-25 NOTE — Progress Notes (Addendum)
Physical Therapy Session Note  Patient Details  Name: Katherine Potts MRN: [id number] Date of Birth: [birth year]  Today's Date: 10/25/2013 PT Individual Time: 1030-1115 PT Individual Time Calculation (min): 45 min   Short Term Goals: Week 1:  PT Short Term Goal 1 (Week 1): Patient will perform bed mobility with supervision. PT Short Term Goal 2 (Week 1): Patient will perform functional transfers with minA. PT Short Term Goal 3 (Week 1): Patient will perform gait training 100' x1 with minA.  Skilled Therapeutic Interventions/Progress Updates:  Pt on toilet with help of other staff.  Toilet transfer to w/c with min assist. L wrist splint on during session.  Therapeutic exercise performed with LE to increase strength for functional mobility: self stretching R and L heel cord and hamstring in sitting; long arc quad extension with DF at end range, 10 x 1 . Kinetron in sitting at resistance 40 cm/sec, x 6 minutes with 1 min rest break.  Gait with RW x 100' very slowly, min guard assist, VCs to stay on task, as pt is verbose. Up/down 5 steps 2 rails, min assist> mod assist due to leaning backwards when ascending and descending.  Pt lead with L foot up/down without knee buckling. Pt c/o bil LE fatigue.  Returned to room; left sitting up in w/c with all needs within reach.     Therapy Documentation Precautions:  Precautions Precautions: Fall Precaution Comments: Recent increase in numbers of falls Required Braces or Orthoses: Other Brace/Splint Other Brace/Splint: Left wrist cock-up splint while up; resting hand splint @ night to prevent contractures Restrictions Weight Bearing Restrictions: No     Pain: Pain Assessment Pain Score: 4  Pain Location: Neck Pain Descriptors / Indicators: Sore Pain Intervention(s): Medication (See eMAR)   Locomotion : Ambulation Ambulation/Gait Assistance: 4: Min assist     See FIM for current functional status  Therapy/Group: Individual  Therapy  Krisann Mckenna 10/25/2013, 12:38 PM

## 2013-10-25 NOTE — Progress Notes (Signed)
Patient ID: Katherine Potts, female   DOB: [birth year], 53 y.o.   MRN: [id number]  Patient ID: Katherine Potts, female   DOB: [birth year], 53 y.o.   MRN: [id number]   [place] [facility] RE[facility]ILITATION     PROGRESS NOTE   10/25/13.  Subjective/Complaints:  53 y/o admit for CIR with functional deficits secondary to deconditioning in the setting of baseline ALS Continues to have considerable muscle spasm and started on Robaxin yesterday.  Numerous concerns about meds and scheduling.   Past Medical History  Diagnosis Date  . GERD (gastroesophageal reflux disease)   . Anxiety   . Depression   . MVP (mitral valve prolapse)   . Cervical spondylosis   . 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 Pneumonia   . Constipation      Objective: Vital Signs: Blood pressure 94/65, pulse 82, temperature 98.3 F (36.8 C), temperature source Oral, resp. rate 18, height  (1.626 m), weight 65.4 kg (144 lb 2.9 oz), SpO2 100.00%. No results found for this basename: WBC, HGB, HCT, PLT,  in the last 72 hours No results found for this basename: NA, K, CL, CO, GLUCOSE, BUN, CREATININE, CALCIUM,  in the last 72 hours CBG (last 3)  No results found for this basename: GLUCAP,  in the last 72 hours  Wt Readings from Last 3 Encounters:  10/23/13 65.4 kg (144 lb 2.9 oz)  10/20/13 63.322 kg (139 lb 9.6 oz)  07/09/13 65.772 kg (145 lb)    Intake/Output Summary (Last 24 hours) at 10/25/13 0835 Last data filed at 10/24/13 1800  Gross per 24 hour  Intake    480 ml  Output      0 ml  Net    480 ml    Patient Vitals for the past 24 hrs:  BP Temp Temp src Pulse Resp SpO2  10/25/13 0604 94/65 mmHg 98.3 F (36.8 C) Oral 82 18 100 %  10/24/13 2335 126/80 mmHg - - - - -  10/24/13 2138 122/92 mmHg 98.2 F (36.8 C) Oral 95 18 98 %  10/24/13 1500 121/86 mmHg 99.4 F (37.4 C) Oral 85 19 95 %    Physical Exam:  53 year old frail female  HENT: oral mucosa  pink/moist  Head: Normocephalic.  Eyes: EOM are normal.  Neck: Normal range of motion. Neck supple. No thyromegaly present.  Cardiovascular: Normal rate and regular rhythm.  Respiratory: Effort normal and breath sounds normal. No respiratory distress.  GI: Soft. Bowel sounds are normal. She exhibits no distension.  Neurological:  Reflex Scores:  Tricep reflexes are 3+ on the right side and 3+ on the left side.  Bicep reflexes are 3+ on the right side and 3+ on the left side.  Brachioradialis reflexes are 3+ on the right side and 3+ on the left side.  Patellar reflexes are 3+ on the right side and 3+ on the left side.  Achilles reflexes are 4+ on the right side and 4+ on the left side.  RUE 5/5 in deltoid Bi, tri grip is 3 to 3+  LUE 3- delt, 4- Bi, tri, 0/5 L WE, FE, 2- FF  RLE 4/5 HF, KE, 2- ankle DF  LLE 4/5 HF KE, ADF   Mild facial diplegia persistent  Clonus Bilateral ankles  Intact PP/LT in all 4's.  Psych: patient pleasant and cooperative, a little anxious/impulsive   Assessment/Plan: 1. Functional deficits secondary to deconditioning in the setting of baseline  ALS 2. DVT Prophylaxis/Anticoagulation: Lovenox 40 mg daily. Monitor platelet counts and any signs of  3. Pain Management/chronic pain syndrome: Lyrica 150 mg 3 times a day, Methadone 5 mg every 8 hours as needed (home dose), oxycodone 10 mg every 4 hours as needed, Valium 5 mg every 12 hours as needed   Continue  Robaxin- will change to scheduled methadone and continue prn oxycodone 4. Mood/depression: Abilify 10 mg daily, Prozac 20 mg daily, Ativan 2 mg 3 times a day as needed. Provide emotional support    LOS (Days) 4 A FACE TO FACE EVALUATION WAS PERFORMED  Rogelia Boga 10/25/2013 8:35 AM

## 2013-10-26 ENCOUNTER — Inpatient Hospital Stay (HOSPITAL_COMMUNITY): Payer: Medicare Other

## 2013-10-26 ENCOUNTER — Inpatient Hospital Stay (HOSPITAL_COMMUNITY): Payer: Medicare Other | Admitting: Physical Therapy

## 2013-10-26 ENCOUNTER — Encounter (HOSPITAL_COMMUNITY): Payer: Medicare Other

## 2013-10-26 DIAGNOSIS — F329 Major depressive disorder, single episode, unspecified: Secondary | ICD-10-CM

## 2013-10-26 DIAGNOSIS — Z5189 Encounter for other specified aftercare: Secondary | ICD-10-CM

## 2013-10-26 DIAGNOSIS — R5381 Other malaise: Secondary | ICD-10-CM

## 2013-10-26 DIAGNOSIS — K219 Gastro-esophageal reflux disease without esophagitis: Secondary | ICD-10-CM

## 2013-10-26 DIAGNOSIS — G1221 Amyotrophic lateral sclerosis: Secondary | ICD-10-CM

## 2013-10-26 DIAGNOSIS — F3289 Other specified depressive episodes: Secondary | ICD-10-CM

## 2013-10-26 NOTE — Care Management Note (Signed)
[facility] Individual Statement of Services  Patient Name:  CYRENE GHARIBIAN  Date:  10/24/2013  Welcome to the [facility]patient [facility].  Our goal is to provide you with an individualized program based on your diagnosis and situation, designed to meet your specific needs.  With this comprehensive rehabilitation program, you will be expected to participate in at least 3 hours of rehabilitation therapies Monday-Friday, with modified therapy programming on the weekends.  Your rehabilitation program will include the following services:  Physical Therapy (PT), Occupational Therapy (OT), 24 hour per day rehabilitation nursing, Therapeutic Recreaction (TR), Neuropsychology, Case Management (Social Worker), Rehabilitation Medicine, Nutrition Services and Pharmacy Services  Weekly team conferences will be held on Tuesdays to discuss your progress.  Your Social Worker will talk with you frequently to get your input and to update you on team discussions.  Team conferences with you and your family in attendance may also be held.  Expected length of stay: 12-14 days  Overall anticipated outcome: supervision  Depending on your progress and recovery, your program may change. Your Social Worker will coordinate services and will keep you informed of any changes. Your Social Worker's name and contact numbers are listed  below.  The following services may also be recommended but are not provided by the Inpatient Rehabilitation [facility]:    Home Health Rehabiltiation Services  Outpatient Rehabilitation Services    Arrangements will be made to provide these services after discharge if needed.  Arrangements include referral to agencies that provide these services.  Your insurance has been verified to be:  Medicare and BCBS Your primary doctor is:  Dr. Ihor Dow  Pertinent information will be shared with your doctor and your insurance company.  Social Worker:  [place], [place]  [phone] or ([phone]   Information discussed with and copy given to patient by: Amada Jupiter, 10/26/2013, 9:12 AM

## 2013-10-26 NOTE — Progress Notes (Signed)
Occupational Therapy Session Note  Patient Details  Name: Katherine Potts MRN: [id number] Date of Birth: 03/06/1960  Today's Date: 10/26/2013 OT Individual Time: 1000-1100 OT Individual Time Calculation (min): 60 min    Short Term Goals: Week 1:  OT Short Term Goal 1 (Week 1): Patient will complete lower body bathing with min assist OT Short Term Goal 2 (Week 1): Patient will dress lower body at edge of bed with mod assist OT Short Term Goal 3 (Week 1): Patient will demo ability to complete HEP for improved [facility] of left hand OT Short Term Goal 4 (Week 1): Patient will complete 4 of 4 grooming tasks with min assist   Skilled Therapeutic Interventions/Progress Updates: ADL-retraining with focus on improved lower body dressing while seated at edge of bed.   Pt received supine in bed with HOB elevated.   Pt performed bed mobility to rise to sit at edge of bed and then ambulated to bathroom with intermittent steadying assist while walking with RW.   Pt completed transfer and was assisted with problem-solving as she struggled to remove tight fitting shirt and clothing while seated on bath bench.   Pt then completed bath, sitting and standing to clean peri-area and buttocks, with only standby assist this date.   Pt returned to edge of bed to dress and was educated on use of reacher and LH shoe horn.   With extra time to lace pants, pt was able to stand to attempt pulling up her pants over her hips but she was unable to complete task due to impaired dynamic standing balance and UE weakness.   Pt donned right shoe while seated using LH shoe horn with min assist.   Pt left with RNs in attendance, applying dermal patches.     Therapy Documentation Precautions:  Precautions Precautions: Fall Precaution Comments: Recent increase in numbers of falls Required Braces or Orthoses: Other Brace/Splint Other Brace/Splint: Left wrist cock-up splint while up; resting hand splint @ night to prevent  contractures Restrictions Weight Bearing Restrictions: No  Pain: Pain Assessment Pain Assessment: No/denies pain Pain Score: 0-No pain  ADL: ADL ADL Comments: see FIM  See FIM for current functional status  Therapy/Group: Individual Therapy  Fermin Yan 10/26/2013, 12:54 PM

## 2013-10-26 NOTE — Progress Notes (Signed)
[place] PHYSICAL MEDICINE & REHABILITATION     PROGRESS NOTE    Subjective/Complaints: Back still tender. Likes heat and lidoderm patches. Appetite and nausea better. Having some leg spasms  Objective: Vital Signs: Blood pressure 112/79, pulse 82, temperature 97.7 F (36.5 C), temperature source Oral, resp. rate 17, height  (1.626 m), weight 65.4 kg (144 lb 2.9 oz), SpO2 100.00%. No results found. No results found for this basename: WBC, HGB, HCT, PLT,  in the last 72 hours No results found for this basename: NA, K, CL, CO, GLUCOSE, BUN, CREATININE, CALCIUM,  in the last 72 hours CBG (last 3)  No results found for this basename: GLUCAP,  in the last 72 hours  Wt Readings from Last 3 Encounters:  10/23/13 65.4 kg (144 lb 2.9 oz)  10/20/13 63.322 kg (139 lb 9.6 oz)  07/09/13 65.772 kg (145 lb)    Physical Exam:  53 year old frail female  HENT: oral mucosa pink/moist  Head: Normocephalic.  Eyes: EOM are normal.  Neck: Normal range of motion. Neck supple. No thyromegaly present.  Cardiovascular: Normal rate and regular rhythm.  Respiratory: Effort normal and breath sounds normal. No respiratory distress.  GI: Soft. Bowel sounds are normal. She exhibits no distension.  Neurological:  Reflex Scores:  Tricep reflexes are 3+ on the right side and 3+ on the left side.  Bicep reflexes are 3+ on the right side and 3+ on the left side.  Brachioradialis reflexes are 3+ on the right side and 3+ on the left side.  Patellar reflexes are 3+ on the right side and 3+ on the left side.  Achilles reflexes are 4+ on the right side and 4+ on the left side.  RUE 5/5 in deltoid Bi, tri grip is 3 to 3+  LUE 3- delt, 4- Bi, tri, 0/5 L WE, FE, 2- FF  RLE 4/5 HF, KE, 2- ankle DF  LLE 4/5 HF KE, ADF   Mild facial diplegia persistent  Clonus Bilateral ankles  Intact PP/LT in all 4's.  Fair insight and awareness, perhaps a little impulsive  Skin: bruise around right eye, a few other  scattered bruises on the extremities.  Musc: left wrist/hand splint in place, tight wrist  Psych: patient pleasant and cooperative, a little anxious/impulsive   Assessment/Plan: 1. 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 Physiatrist is providing close team supervision and 24 hour management of active medical problems listed below. Physiatrist and rehab team continue to assess barriers to discharge/monitor patient progress toward functional and medical goals.  FIM: FIM - Bathing Bathing Steps Patient Completed: Chest;Right Arm;Left Arm;Abdomen;Front perineal area;Buttocks;Right upper leg;Left upper leg Bathing: 4: Min-Patient completes 8-9 53f 10 parts or 75+ percent  FIM - Upper Body Dressing/Undressing Upper body dressing/undressing steps patient completed: Thread/unthread right sleeve of pullover shirt/dresss;Thread/unthread left sleeve of pullover shirt/dress Upper body dressing/undressing: 3: Mod-Patient completed 50-74% of tasks FIM - Lower Body Dressing/Undressing Lower body dressing/undressing steps patient completed: Thread/unthread right underwear leg;Thread/unthread left underwear leg;Thread/unthread right pants leg;Thread/unthread left pants leg Lower body dressing/undressing: 1: Total-Patient completed less than 25% of tasks (due to fatigue)  FIM - Toileting Toileting steps completed by patient: Adjust clothing prior to toileting;Performs perineal hygiene;Adjust clothing after toileting Toileting Assistive Devices: Grab bar or rail for support Toileting: 5: Supervision: Safety issues/verbal cues  FIM - Diplomatic Services operational officer Devices: Psychiatrist  Transfers: 4-To toilet/BSC: Min A (steadying Pt. > 75%);4-From toilet/BSC: Min A (steadying Pt. > 75%)  FIM - Bed/Chair Transfer Bed/Chair Transfer Assistive  Devices: Bed rails;Arm rests Bed/Chair Transfer: 4: Bed > Chair or W/C: Min A (steadying Pt. > 75%)  FIM - Locomotion: Wheelchair Distance: 60 Locomotion: Wheelchair: 1: Total Assistance/staff pushes wheelchair (Pt<25%) FIM - Locomotion: Ambulation Locomotion: Ambulation Assistive Devices: Designer, industrial/product Ambulation/Gait Assistance: 4: Min assist Locomotion: Ambulation: 2: Travels 50 - 149 ft with supervision/safety issues  Comprehension Comprehension Mode: Auditory Comprehension: 6-Follows complex conversation/direction: With extra time/assistive device  Expression Expression Mode: Verbal Expression: 6-Expresses complex ideas: With extra time/assistive device  Social Interaction Social Interaction: 6-Interacts appropriately with others with medication or extra time (anti-anxiety, antidepressant).  Problem Solving Problem Solving: 6-Solves complex problems: With extra time  Memory Memory: 5-Recognizes or recalls 90% of the time/requires cueing < 10% of the time  Medical Problem List and Plan:  1. 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 Additional deconditioning after medical issues above  2. DVT Prophylaxis/Anticoagulation: Lovenox 40 mg daily. Monitor platelet counts and any signs of  3. Pain Management/chronic pain syndrome: Lyrica 150 mg 3 times a day, Methadone 5 mg every 8 hours as needed (home dose), oxycodone 10 mg every 4 hours as needed, Valium 5 mg every 12 hours as needed   -continue lidoderm patch for back  -k pad  -continue decadron  -xrays of c and t spine without acute findings  -pt AGREES that she doesn't want to increase narcs.  -will observe on robaxin for LE spasms 4. Mood/depression: Abilify 10 mg daily, Prozac 20 mg daily, Ativan 2 mg 3 times a day as needed. Provide emotional support  5. Neuropsych: This patient is capable of making decisions on her own behalf.  6. Skin/Wound Care:  Routine skin care. Patient multiple bruises from falls  7. History of ileus with nonspecific abdominal pain. Continue Bentyl 10 mg 3 times a day as well as nexium  8. Left wrist fracture April 2015. Continue splint conservative care/weightbearing as tolerated  9. Nausea/GERD---nexium bid  -rx nausea prn,  -monitor po intake, encouraged her to eat food from home if she wishes.   LOS (Days) 5 A FACE TO FACE EVALUATION WAS PERFORMED  Maebry Obrien T 10/26/2013 8:00 AM

## 2013-10-26 NOTE — Progress Notes (Signed)
Physical Therapy Session Note  Patient Details  Name: Katherine Potts MRN: [id number] Date of Birth: 1961/02/11  Today's Date: 10/26/2013 PT Individual Time: 1100-1204 PT Individual Time Calculation (min): 64 min  Session 2 Time: Time Calculation (min)  Short Term Goals: Week 1:  PT Short Term Goal 1 (Week 1): Patient will perform bed mobility with supervision. PT Short Term Goal 2 (Week 1): Patient will perform functional transfers with minA. PT Short Term Goal 3 (Week 1): Patient will perform gait training 100' x1 with minA.  Skilled Therapeutic Interventions/Progress Updates:    Session 1: Pt received seated in w/c, agreeable to participate in therapy. Pt propelled w/c 20' w/ RUE on hallway rail, then therapist pushed w/c to hallway outside rehab gym. Pt ambulated 25' x2 then 50', all w/ CGA-MinA w/ x1 LOB requiring MaxA to correct. 2/3 times pt was able to identify when she needed to sit and rest. In between bouts of ambulation pt demonstrated self-stretching technique (AROM LAQ + DF) for HS/ heel cord stretching. Therapist also provided PROM and educated pt on AAROM to stretch heel cord w/ heel slide technique w/ foot flat on floor. Also demonstrated heel slides for engaging HS on BLE in order to manage quadricep tone. Pt negotiated up 3 stairs w/ Min-ModA, while descending stairs pt required TotalA to take seated rest break on therapist's knee. After 2 minute rest, pt able to descend remaining stairs w/ ModA. Pt propelled w/c 50' w/ BLE for HS strength before fatiguing. Pushed pt back to room and set pt up for lunch. Pt left seated in w/c w/ all needs within reach.  Session 2: Pt received seated EOB w/ father present, agreeable to participate in therapy. Pt performed SPT bed>w/c w/ RW and MinA, pt required encouragement to use RW, was preparing to transfer w/ no AD. Transported pt to gym in w/c. SPT w/c>Nustep w/ RW and MinA. Pt completed 10' on Nustep Level 2 w/ LE only, 5' of which  therapist used metronome set at 30 bpm then 40 bpm for pt to have auditory cue for steps. Pt ambulated 5' Nustep to mat table, moved sit>supine w/ S. Completed supine and sidelying exercises for NMR on mat table x10 ea briding, HS curls w/ green theraball, straight leg raise, lower trunk rotation, and sidelying knee flexion w/ therapist blocking hip flexion in order to isolate hamstrings for quadricep tone management. Pt ascended 3 stairs and descended 2 w/ overall MinA. Just like earlier session, while descending stairs pt had near LOB requiring MaxA to remain standing, able to recover and make it to w/c at bottom of stairs. Pt propelled w/c w/ BLE 25' before reporting significant fatigue. Transported pt remaining distance back to room, pt left seated in w/c with all needs within reach and father present.   Therapy Documentation Precautions:  Precautions Precautions: Fall Precaution Comments: Recent increase in numbers of falls Required Braces or Orthoses: Other Brace/Splint Other Brace/Splint: Left wrist cock-up splint while up; resting hand splint @ night to prevent contractures Restrictions Weight Bearing Restrictions: No General:   Vital Signs:   Pain:   Mobility:   Locomotion :    Trunk/Postural Assessment :    Balance:   Exercises:   Other Treatments:    See FIM for current functional status  Therapy/Group: Individual Therapy  Hosie Spangle Hosie Spangle, PT, DPT 10/26/2013, 10:49 AM

## 2013-10-26 NOTE — Progress Notes (Signed)
Social Work  Social Work Assessment and Plan  Patient Details  Name: Katherine Potts MRN: [id number] Date of Birth: 11/09/1960  Today's Date: 10/24/2013  Problem List:  Patient Active Problem List   Diagnosis Date Noted  . Nausea & vomiting 10/19/2013  . Intractable nausea and vomiting 10/19/2013  . Closed fracture of left distal radius 07/09/2013  . 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 Weakness of hand 12/31/2012  . Incisional hernia, without obstruction or gangrene 07/02/2012  . GERD (gastroesophageal reflux disease) 03/06/2012  . Depression 03/06/2012   Past Medical History:  Past Medical History  Diagnosis Date  . GERD (gastroesophageal reflux disease)   . Anxiety   . Depression   . MVP (mitral valve prolapse)   . Cervical spondylosis   . 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 Pneumonia   . Constipation    Past Surgical History:  Past Surgical History  Procedure Laterality Date  . Eye surgery    . Breast surgery    . Vagina surgery      mesh  . Kidney donation      Right nephrectomy  . Abdominal hernia repair    . Orif wrist fracture  07/09/2013    DR Melvyn Novas  . Orif wrist fracture Left 07/09/2013    Procedure: OPEN REDUCTION INTERNAL FIXATION (ORIF) LEFT  WRIST FRACTURE;  Surgeon: Sharma Covert, MD;  Location: [facility] OR;  Service: Orthopedics;  Laterality: Left;   Social History:  reports that she has quit smoking. Her smoking use included Cigarettes and Cigars. She smoked 0.40 packs per day. She has never used smokeless tobacco. She reports that she uses illicit drugs (Marijuana). She reports that she does not drink alcohol.  Family / Support Systems Marital Status: Widow/Widower How Long?: 18 yrs Patient Roles: Parent;Partner Spouse/Significant Other: boyfriend, Letitia Libra @ [phone] Children: son, Avonne Berkery (30) @ [phone] Other Supports: brother, Atilano Ina @ [phone] another  brother, Chanetta Marshall and her father living in [place] Anticipated Caregiver: son, Casimiro Needle, 51 years old; pt. states he has indicated he wants to be her primary caregiver.  Pt. is widowed ; pt's dad died when son was a year old.  Son has experience caring for his grandmother until her passing with home hospice. Ability/Limitations of Caregiver: Son is not in school or employed.  Pt. states she is trying to get him to take online classes Caregiver Availability: 24/7 Family Dynamics: pt describes very close relationship with her son and denies any concerns about his committment to providing assistance to her following d/c.    Social History Preferred language: English Religion: Catholic Cultural Background: NA Education: college Read: Yes Write: Yes Employment Status: Disabled Date Retired/Disabled/Unemployed: Jan 2015 Legal Hisotry/Current Legal Issues: None Guardian/Conservator: None - per MD, pt capable of making decisions on her own behalf   Abuse/Neglect Physical Abuse: Denies Verbal Abuse: Denies Sexual Abuse: Denies Exploitation of patient/patient's resources: Denies Self-Neglect: Denies  Emotional Status Pt's affect, behavior adn adjustment status: Pt very talkative and with pressured speech at times. She definitely drives the overall interview.  Requires minimal redirection to answer questions at time. She speaks very openly about her ALS diagnosis and her prognosis of 3-5 yrs "at most" and states, "But I'm not going to give up.  I'll fight the whole time".  A little tearful at times as she speaks of her son "an orphan", noting that her husband died with cancer when her son was  only a year old.  She is focused on managing her pain.  Admits periods of depression and anxiety as she copes with her illness.  Have referred to neuropsych for consult already. 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 Pyschiatric History: depression and anxiety which she attributes to coping with ALS. Substance Abuse History: None  Patient / Family Perceptions, Expectations & Goals Pt/Family understanding of illness & functional limitations: Pt with very good understanding of her diagnosis giver her training as a Charity fundraiser adn personal experience with ALS in her family.  Son also with good understanding of pt's illnesss and likely course. Premorbid pt/family roles/activities: Pt was independent overall at home, however, no longer drives.  Son is primary Land.  Pt does manage her own meds and shares some house responsibilities with son. Anticipated changes in roles/activities/participation: little change anticipated if reaching supervision goals Pt/family expectations/goals: "I just hope I can get [employer] Group: Other (Comment) (followed at [facility] MD/ALS clinic) Premorbid Home Care/DME Agencies: None Transportation available at discharge: yes Resource referrals recommended: Neuropsychology;Support group (specify);Advocacy groups  Discharge Planning Living Arrangements: Children Support Systems: Children;Spouse/significant other;Other relatives;Friends/neighbors Type of Residence: Private residence Insurance Resources: [employer] Financial Resources: SSD Financial Screen Referred: No Living Expenses: Database administrator Management: Patient Does the patient have any problems obtaining your medications?: No Home Management: pt and son Patient/Family Preliminary Plans: pt plans to return home with her son as primary caregiver Social Work Anticipated Follow Up Needs: HH/OP Expected length of stay: 12-14 days  Clinical Impression Pleasant, talkative woman here with ALS exacerbation.  At times with almost pressured speech and slight flares of frustration, however, quickly redirected to interview at hand.  Speaks openly about her diagnosis and prognosis.  Ready to begin "making  plans' about legal arrangements, end of life choices.  Have referred to neuropsych for coping support given h/o depression and anxiety.  Will follow for support and d/c planning.  Ananias Kolander 10/24/2013, 9:00 AM

## 2013-10-27 ENCOUNTER — Encounter (HOSPITAL_COMMUNITY): Payer: Medicare Other

## 2013-10-27 ENCOUNTER — Inpatient Hospital Stay (HOSPITAL_COMMUNITY): Payer: Medicare Other

## 2013-10-27 DIAGNOSIS — R5381 Other malaise: Secondary | ICD-10-CM

## 2013-10-27 DIAGNOSIS — F3289 Other specified depressive episodes: Secondary | ICD-10-CM

## 2013-10-27 DIAGNOSIS — F329 Major depressive disorder, single episode, unspecified: Secondary | ICD-10-CM

## 2013-10-27 DIAGNOSIS — G1221 Amyotrophic lateral sclerosis: Secondary | ICD-10-CM

## 2013-10-27 DIAGNOSIS — Z5189 Encounter for other specified aftercare: Secondary | ICD-10-CM

## 2013-10-27 DIAGNOSIS — K219 Gastro-esophageal reflux disease without esophagitis: Secondary | ICD-10-CM

## 2013-10-27 MED ORDER — PREGABALIN 50 MG PO CAPS
100.0000 mg | ORAL_CAPSULE | Freq: Two times a day (BID) | ORAL | Status: DC
  Administered 2013-10-27 – 2013-10-29 (×6): 100 mg via ORAL
  Filled 2013-10-27 (×7): qty 2

## 2013-10-27 NOTE — Progress Notes (Signed)
INITIAL NUTRITION ASSESSMENT  DOCUMENTATION CODES Per approved criteria  -Not Applicable   INTERVENTION: -Downgrade diet to dysphagia 2 diet.  -Provide nourishment snacks (sandwich at 8pm and cookie between meals).  NUTRITION DIAGNOSIS: Increased nutrient needs related to chronic illness, deconditioning as evidenced by estimated nutrition needs.   Goal: Pt to meet >/= 90% of their estimated nutrition needs   Monitor:  PO intake, weight trends, labs, I/O's  Reason for Assessment: Consult  53 y.o. female  Admitting Dx: ALS  ASSESSMENT: Pt admitted to Inpatient Rehabilitation with the diagnosis of deconditioning/ALS. Patient with PMHx significant GERD, depression, MVP, ALS, pneumonia, constipation.   Pt reports she has a good appetite with meal completion of 85-100%. Pt does report she has trouble eating foods and cutting up her foods. Pt reports she would like it cut up very fine. Will downgrade diet to dysphagia 2. Pt reports she would also like nourishment snacks (peanut butter and jelly sandwich for HS snack and a cookie in between meals) as she has been having an increased appetite due to her medication use. Nourishment snacks have been ordered. Pt reports she has not had any recent weight loss. Pt with no observed signs of significant fat or muscle mass loss.  Height: Ht Readings from Last 1 Encounters:  10/21/13  (1.626 m)    Weight: Wt Readings from Last 1 Encounters:  10/23/13 144 lb 2.9 oz (65.4 kg)    Ideal Body Weight: 120 lbs  % Ideal Body Weight: 120%  Wt Readings from Last 10 Encounters:  10/23/13 144 lb 2.9 oz (65.4 kg)  10/20/13 139 lb 9.6 oz (63.322 kg)  07/09/13 145 lb (65.772 kg)  07/09/13 145 lb (65.772 kg)  06/29/13 145 lb (65.772 kg)  06/24/13 141 lb (63.957 kg)  04/10/13 148 lb (67.132 kg)  12/31/12 152 lb 8 oz (69.174 kg)  12/26/12 153 lb 12.8 oz (69.763 kg)  07/02/12 145 lb (65.772 kg)    Usual Body Weight: 140 lbs  % Usual Body  Weight: 103%  BMI:  Body mass index is 24.74 kg/(m^2).  Estimated Nutritional Needs: Kcal: 1700 - 1900  Protein: 85 - 100 grams  Fluid: 1.7 - 1.9 L/day  Skin: no issues noted  Diet Order: General  EDUCATION NEEDS: -No education needs identified at this time   Intake/Output Summary (Last 24 hours) at 10/27/13 1615 Last data filed at 10/27/13 1224  Gross per 24 hour  Intake    720 ml  Output      0 ml  Net    720 ml    Last BM: 8/30   Labs:   Recent Labs Lab 10/21/13 2052 10/22/13 0620  NA  --  142  K  --  3.9  CL  --  108  CO2  --  21  BUN  --  14  CREATININE 1.26* 1.27*  CALCIUM  --  9.1  GLUCOSE  --  99    CBG (last 3)  No results found for this basename: GLUCAP,  in the last 72 hours  Scheduled Meds: . ARIPiprazole  2 mg Oral Daily  . dexamethasone  2 mg Oral Daily  . docusate sodium  100 mg Oral BID  . enoxaparin (LOVENOX) injection  40 mg Subcutaneous Q24H  . esomeprazole  40 mg Oral BID  . FLUoxetine  20 mg Oral Daily  . lidocaine  2 patch Transdermal Q24H  . methadone  5 mg Oral 3 times per day  . methocarbamol  500 mg Oral TID  . polyethylene glycol  17 g Oral Daily  . pregabalin  100 mg Oral BID  . senna  2 tablet Oral QHS    Continuous Infusions:   Past Medical History  Diagnosis Date  . GERD (gastroesophageal reflux disease)   . Anxiety   . Depression   . MVP (mitral valve prolapse)   . Cervical spondylosis   . 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 Pneumonia   . Constipation     Past Surgical History  Procedure Laterality Date  . Eye surgery    . Breast surgery    . Vagina surgery      mesh  . Kidney donation      Right nephrectomy  . Abdominal hernia repair    . Orif wrist fracture  07/09/2013    DR Melvyn Novas  . Orif wrist fracture Left 07/09/2013    Procedure: OPEN REDUCTION INTERNAL FIXATION (ORIF) LEFT  WRIST FRACTURE;  Surgeon: Sharma Covert, MD;  Location: [facility] OR;  Service:  Orthopedics;  Laterality: Left;    Marijean Niemann, MS, Provisional LDN Pager # [phone] After hours/ weekend pager # [phone]

## 2013-10-27 NOTE — Progress Notes (Signed)
Physical Therapy Session Note  Patient Details  Name: Katherine Potts MRN: [id number] Date of Birth: 08-Aug-1960  Today's Date: 10/27/2013 PT Individual Time: 1100-1200 PT Individual Time Calculation (min): 60 min  Session 2 Time: 1600-1700 Time Calculation (min) 60 min   Short Term Goals: Week 1:  PT Short Term Goal 1 (Week 1): Patient will perform bed mobility with supervision. PT Short Term Goal 2 (Week 1): Patient will perform functional transfers with minA. PT Short Term Goal 3 (Week 1): Patient will perform gait training 100' x1 with minA.  Skilled Therapeutic Interventions/Progress Updates:    Session 1: Pt received seated in w/c handed off from OT. Session focused on standing balance, functional mobility, ambulation. Pt reporting some cramping in calves this AM but agreeable to participate in therapy. Pt transported to hallway outside rehab gym. Ambulated 40' w/ RW and MinA, max VC's to keep RW close and for upright positioning. Pt continues to demonstrate foot drag R>L likely 2/2 increased muscle tone in plantarflexors. Seated edge of mat pt completed standing and seated balance exercises reaching outside BOS, reaching for weighted objects, reaching across midline. Picked up cones off floor on R side, placed on table on L. Reached for horseshoes on basketball goal on R, placed on table on L. All reaching mod excursion from BOS. Pt w/ frequent posterior lean during activity requiring ModA to correct. Standing activity at [facility] for glute strengthening x5 sit<>Stands w/ static standing assist ranging from ModA progressing to close S w/ further practice. Dynamic standing reaching forward for horse shoes w/ no LOB noted. Noted pt w/ improved balance in environment w/ fewer distractions. Pt ambulated 40' back to w/c, transported pt to room and setup pt for lunch. Pt left seated in w/c w/ all needs within reach.  Session 2: Pt received seated in w/c, agreeable to participate in therapy. Pt's son  present for first half of treatment, provided appropriate support and answered history questions. Pt ambulated 50'x2 w/ RW and MinA. Noted increased foot drag vs AM session. Therapist provided stretching for plantarflexors during rest break as pt indicated cramping in area during gait. Pt w/ x5 sit<>stands from bed in ADL apartment, long discussion w/ pt and son about home setup and mobility options. Introduced pt to idea of power mobility, pt indicated she would be interested in pursuing this option if medical team agreed. Social Worker made aware, therapist will continue conversation with treatment team as to best mobility options given progressive nature of disease. Pt completed 10' on Nustep L2 w/ LE only for strengthening and tone management. 15x forward ball roll w/ blue theraball on mat w/ pt standing at edge of mat to encourage anterior weight shift for standing balance. Pt transported back to room, left seated in w/c w/ family present w/ all needs within reach.   Therapy Documentation Precautions:  Precautions Precautions: Fall Precaution Comments: Recent increase in numbers of falls Required Braces or Orthoses: Other Brace/Splint Other Brace/Splint: Left wrist cock-up splint while up; resting hand splint @ night to prevent contractures Restrictions Weight Bearing Restrictions: No General:   Vital Signs: Therapy Vitals Temp: 97.7 F (36.5 C) Temp src: Oral Pulse Rate: 78 Resp: 17 BP: 114/81 mmHg Patient Position (if appropriate): Lying Oxygen Therapy SpO2: 96 % O2 Device: None (Room air) Pain: Pain Assessment Pain Assessment: 0-10 Pain Score: 5  Pain Type: Chronic pain Pain Location: Neck (Back) Pain Orientation: Mid (mid back) Pain Descriptors / Indicators: Throbbing;Sore Pain Frequency: Constant Pain Onset: On-going Patients  Stated Pain Goal: 3 Pain Intervention(s): Medication (See eMAR) Mobility:   Locomotion :    Trunk/Postural Assessment :    Balance:    Exercises:   Other Treatments:    See FIM for current functional status  Therapy/Group: Individual Therapy  Hosie Spangle Hosie Spangle, PT, DPT 10/27/2013, 7:50 AM

## 2013-10-27 NOTE — Plan of Care (Signed)
Problem: RH BOWEL ELIMINATION Goal: RH STG MANAGE BOWEL WITH ASSISTANCE STG Manage Bowel with minimal Assistance.  Outcome: Progressing LBM 8/30, hx constipation

## 2013-10-27 NOTE — Progress Notes (Signed)
Note and chart reviewed. Agree with assessment and intervention. Dexter Signor Barnett RD, LDN Inpatient Clinical Dietitian Pager: [phone] After Hours Pager: [phone]  

## 2013-10-27 NOTE — Progress Notes (Signed)
Occupational Therapy Session Note  Patient Details  Name: Katherine Potts MRN: [id number] Date of Birth: Mar 17, 1960  Today's Date: 10/27/2013 OT Individual Time: 1000-1100 OT Individual Time Calculation (min): 60 min    Short Term Goals: Week 1:  OT Short Term Goal 1 (Week 1): Patient will complete lower body bathing with min assist OT Short Term Goal 2 (Week 1): Patient will dress lower body at edge of bed with mod assist OT Short Term Goal 3 (Week 1): Patient will demo ability to complete HEP for improved [facility] of left hand OT Short Term Goal 4 (Week 1): Patient will complete 4 of 4 grooming tasks with min assist   Skilled Therapeutic Interventions/Progress Updates: ADL-retraining with focus on improved function of BUE during ADL, dynamic standing balance, and lower body dressing using AE.   Pt received supine in bed and completed bed mobility using HOB elevated and bed rail to rise to sitting at edge of bed unassisted.   Pt was initially unable to tolerate standing or mobility using RW this session d/t increased RLE hypertonicity and requested use of w/c but after brief rest returned to RW and ambulated to tub bench with min assist during mobility to maintain balance.   Pt completed shower this date unassisted and reported ability to wash under her arms using LH sponge if provided one, as she has done task at home (sponge provided this date).   Pt transferred back to w/c and groomed at sink with setup assist.   Pt required mod assist for upper body dressing and max-total assist for lower due to continued episodic hypertonicity at LE, although attempting use of long shoe horn as instructed.   Session concluded with provision of foam blocks to improve grip/pinch strength and pt ed on hand strengthening (instruction sheet provided but not referenced d/t out of time).   Pt continues to require prolonged session with bathing and dressing due to delayed motor response and impaired [facility] but she will  persist in her efforts to attempt task in order to maintain highest level of independence with performance of BADL.        Therapy Documentation Precautions:  Precautions Precautions: Fall Precaution Comments: Recent increase in numbers of falls Required Braces or Orthoses: Other Brace/Splint Other Brace/Splint: Left wrist cock-up splint while up; resting hand splint @ night to prevent contractures Restrictions Weight Bearing Restrictions: No  Pain: No/denies pain   ADL: ADL ADL Comments: see FIM  See FIM for current functional status  Therapy/Group: Individual Therapy  Joscelyn Hardrick 10/27/2013, 2:57 PM

## 2013-10-27 NOTE — Consult Note (Signed)
INITIAL DIAGNOSTIC EVALUATION - CONFIDENTIAL [facility]   MEDICAL NECESSITY:  Katherine Potts was seen on the [facility] In[facility] Rehabilitation Unit for an initial diagnostic evaluation owing to the patient's diagnosis of ALS.   According to medical records, Katherine Potts was admitted to the rehab unit owing to "Functional deficits secondary to ALS with spasticity, left upper extremity and distal right lower extremity. Additional deconditioning after medical issues above." She has a history of left wrist fracture April 2015, ileus, chronic pain syndrome (maintained on methadone as needed), ALS (followed by neurology services). She was diagnosed with ALS in November of 2014 and also goes to [facility] clinic. She was reportedly admitted 10/19/2013 with abdominal pain with nausea, diarrhea, vomiting as well as generalized weakness and falls.   During today's visit, Katherine Potts reported suffering from stable memory loss and improving attention-related issues. From an emotional standpoint, she described having a longstanding history of depression and anxiety for which she is prescribed Ativan, Abilify, Valium (at bedtime), and Prozac. She finds these medications to be helpful. She is unsure why she was prescribed Abilify but thought that it might be because she became "hysterical" a few months ago. Patient has a history of chronic pain for which she take methadone and oxycodone PRN. Most of her present mood symptoms are related to loss of function/independence and wanting to discharge home, though no major adjustment issues endorsed. No barriers to therapy identified except that she feels she has trouble walking. Suicidal/homicidal ideation, plan or intent was denied. No manic or hypomanic episodes were reported. The patient denied ever experiencing any auditory/visual hallucinations. No major behavioral or personality changes were endorsed.   Katherine Potts reported that her son  will be her primary caregiver but he does not take advantage of caregiver resources. I suggested that maybe he should meet with one of our social workers. Prior to her being diagnosed with ALS, she was fired from her job. She feels that her issues at work were related to ALS and she plans to seek legal counseling upon discharge. She believes that she is making slow progress in therapy and has enjoyed the rehab staff.   PROCEDURES ADMINISTERED: [1 unit [id number] on 10/26/13] Diagnostic clinical interview  Review of available records  Behavioral Evaluation: Katherine Potts was appropriately dressed for season and situation, and she appeared tidy and well-groomed. Normal posture was noted. She was friendly and rapport was adequately established. Her speech mildly slurred but she was able to express ideas effectively. Her affect was flat and she appeared somewhat sedated (likely owing to medication side effects).  Attention and motivation were adequate.    Overall, Katherine Potts reported suffering from cognitive, emotional, and motor symptoms owing to ALS. I think that certain medication side effects are also likely impairing cognition and causing fatigue. Plan will be to follow the patient throughout her admission for supportive psychotherapy and to assist with coping as well as to provide psychoeducation. We will also have a member of the team meet with her son for caregiver education. Neuropsychological evaluation as an outpatient may be beneficial at some point.    RECOMMENDATIONS    Patient would like her physician to consider discontinuing Abilify. I leave this to his/her discretion.    Brief counseling for social support will be scheduled for next Tuesday.    Patient would also like to have her son meet with social work. This was discussed with them and will be scheduled.   DIAGNOSES:  Amyotrophic lateral  sclerosis Adjustment disorder with mixed depression and anxiety   Debbe Mounts, Psy.D.   Clinical Neuropsychologist

## 2013-10-27 NOTE — Progress Notes (Signed)
[place] PHYSICAL MEDICINE & REHABILITATION     PROGRESS NOTE    Subjective/Complaints: Back still tender but working through. Appetite good. Still has some leg spasms. Robaxin may have helped somewhat  Objective: Vital Signs: Blood pressure 114/81, pulse 78, temperature 97.7 F (36.5 C), temperature source Oral, resp. rate 17, height  (1.626 m), weight 65.4 kg (144 lb 2.9 oz), SpO2 96.00%. No results found. No results found for this basename: WBC, HGB, HCT, PLT,  in the last 72 hours No results found for this basename: NA, K, CL, CO, GLUCOSE, BUN, CREATININE, CALCIUM,  in the last 72 hours CBG (last 3)  No results found for this basename: GLUCAP,  in the last 72 hours  Wt Readings from Last 3 Encounters:  10/23/13 65.4 kg (144 lb 2.9 oz)  10/20/13 63.322 kg (139 lb 9.6 oz)  07/09/13 65.772 kg (145 lb)    Physical Exam:  53 year old frail female  HENT: oral mucosa pink/moist  Head: Normocephalic.  Eyes: EOM are normal.  Neck: Normal range of motion. Neck supple. No thyromegaly present.  Cardiovascular: Normal rate and regular rhythm.  Respiratory: Effort normal and breath sounds normal. No respiratory distress.  GI: Soft. Bowel sounds are normal. She exhibits no distension.  Neurological:  Reflex Scores:  Tricep reflexes are 3+ on the right side and 3+ on the left side.  Bicep reflexes are 3+ on the right side and 3+ on the left side.  Brachioradialis reflexes are 3+ on the right side and 3+ on the left side.  Patellar reflexes are 3+ on the right side and 3+ on the left side.  Achilles reflexes are 4+ on the right side and 4+ on the left side.  RUE 5/5 in deltoid Bi, tri grip is 3 to 3+  LUE 3- delt, 4- Bi, tri, 0/5 L WE, FE, 2- FF  RLE 4/5 HF, KE, 2- ankle DF  LLE 4/5 HF KE, ADF   Inconsistent tone in LE's. Pt activated left leg and tone disappeared  Clonus Bilateral ankles  Intact PP/LT in all 4's.  Fair insight and awareness, perhaps a little impulsive   Skin: bruise around right eye, a few other scattered bruises on the extremities.  Musc: left wrist/hand splint in place, tight wrist  Psych: patient pleasant and cooperative, a little anxious/impulsive   Assessment/Plan: 1. 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 Physiatrist is providing close team supervision and 24 hour management of active medical problems listed below. Physiatrist and rehab team continue to assess barriers to discharge/monitor patient progress toward functional and medical goals.  FIM: FIM - Bathing Bathing Steps Patient Completed: Chest;Right Arm;Left Arm;Abdomen;Front perineal area;Buttocks;Right upper leg;Left upper leg;Right lower leg (including foot);Left lower leg (including foot) Bathing: 4: Min-Patient completes 8-9 61f 10 parts or 75+ percent  FIM - Upper Body Dressing/Undressing Upper body dressing/undressing steps patient completed: Thread/unthread right sleeve of pullover shirt/dresss;Thread/unthread left sleeve of pullover shirt/dress Upper body dressing/undressing: 3: Mod-Patient completed 50-74% of tasks FIM - Lower Body Dressing/Undressing Lower body dressing/undressing steps patient completed: Thread/unthread right underwear leg;Thread/unthread right pants leg Lower body dressing/undressing: 2: Max-Patient completed 25-49% of tasks  FIM - Toileting Toileting steps completed by patient: Adjust clothing prior to toileting;Performs perineal hygiene;Adjust clothing after toileting Toileting Assistive Devices: Grab bar or rail for support Toileting: 5: Supervision: Safety issues/verbal cues  FIM - Archivist  Transfers Assistive Devices: Bedside commode Toilet Transfers: 4-To toilet/BSC: Min A (steadying Pt. > 75%);4-From toilet/BSC: Min A (steadying Pt. > 75%)  FIM - Bed/Chair Transfer Bed/Chair  Transfer Assistive Devices: HOB elevated;Bed rails;Walker Bed/Chair Transfer: 4: Bed > Chair or W/C: Min A (steadying Pt. > 75%);4: Chair or W/C > Bed: Min A (steadying Pt. > 75%)  FIM - Locomotion: Wheelchair Distance: 60 Locomotion: Wheelchair: 1: Travels less than 50 ft with minimal assistance (Pt.>75%) FIM - Locomotion: Ambulation Locomotion: Ambulation Assistive Devices: Designer, industrial/product Ambulation/Gait Assistance: 4: Min assist Locomotion: Ambulation: 2: Travels 50 - 149 ft with minimal assistance (Pt.>75%)  Comprehension Comprehension Mode: Auditory Comprehension: 6-Follows complex conversation/direction: With extra time/assistive device  Expression Expression Mode: Verbal Expression: 6-Expresses complex ideas: With extra time/assistive device  Social Interaction Social Interaction: 6-Interacts appropriately with others with medication or extra time (anti-anxiety, antidepressant).  Problem Solving Problem Solving: 6-Solves complex problems: With extra time  Memory Memory: 5-Recognizes or recalls 90% of the time/requires cueing < 10% of the time  Medical Problem List and Plan:  1. 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 Additional deconditioning after medical issues above  2. DVT Prophylaxis/Anticoagulation: Lovenox 40 mg daily. Monitor platelet counts and any signs of  3. Pain Management/chronic pain syndrome: Lyrica 150 mg 2 times a day---decrease to , Methadone 5 mg every 8 hours as needed (home dose), oxycodone 10 mg every 4 hours as needed, Valium 5 mg every 12 hours as needed   -continue lidoderm patch for back  -k pad  -continue decadron  -xrays of c and t spine without acute findings  -pt AGREES that she doesn't want to increase narcs.  -will observe on robaxin for LE spasms---consider baclofen trial (spasms inconsistent) 4. Mood/depression: Abilify 10 mg daily, Prozac 20 mg daily, Ativan 2  mg 3 times a day as needed. Provide emotional support  5. Neuropsych: This patient is capable of making decisions on her own behalf.  6. Skin/Wound Care: Routine skin care. Patient multiple bruises from falls  7. History of ileus with nonspecific abdominal pain. Continue Bentyl 10 mg 3 times a day as well as nexium  8. Left wrist fracture April 2015. Continue splint conservative care/weightbearing as tolerated  9. Nausea/GERD---nexium bid  -rx nausea prn,  -monitor po intake, encouraged her to eat food from home if she wishes.   LOS (Days) 6 A FACE TO FACE EVALUATION WAS PERFORMED  Leslye Puccini T 10/27/2013 7:37 AM

## 2013-10-28 ENCOUNTER — Encounter (HOSPITAL_COMMUNITY): Payer: Medicare Other

## 2013-10-28 ENCOUNTER — Inpatient Hospital Stay (HOSPITAL_COMMUNITY): Payer: Medicare Other | Admitting: Physical Therapy

## 2013-10-28 ENCOUNTER — Inpatient Hospital Stay (HOSPITAL_COMMUNITY): Payer: Medicare Other | Admitting: Occupational Therapy

## 2013-10-28 ENCOUNTER — Inpatient Hospital Stay (HOSPITAL_COMMUNITY): Payer: Medicare Other

## 2013-10-28 DIAGNOSIS — Z5189 Encounter for other specified aftercare: Secondary | ICD-10-CM

## 2013-10-28 DIAGNOSIS — K219 Gastro-esophageal reflux disease without esophagitis: Secondary | ICD-10-CM

## 2013-10-28 DIAGNOSIS — F329 Major depressive disorder, single episode, unspecified: Secondary | ICD-10-CM

## 2013-10-28 DIAGNOSIS — F3289 Other specified depressive episodes: Secondary | ICD-10-CM

## 2013-10-28 DIAGNOSIS — R5381 Other malaise: Secondary | ICD-10-CM

## 2013-10-28 DIAGNOSIS — G1221 Amyotrophic lateral sclerosis: Secondary | ICD-10-CM

## 2013-10-28 LAB — CREATININE, SERUM
Creatinine, Ser: 1.26 mg/dL — ABNORMAL HIGH (ref 0.50–1.10)
GFR, EST AFRICAN AMERICAN: 56 mL/min — AB (ref >90)
GFR, EST NON AFRICAN AMERICAN: 48 mL/min — AB (ref >90)

## 2013-10-28 MED ORDER — BACLOFEN 5 MG HALF TABLET
5.0000 mg | ORAL_TABLET | Freq: Three times a day (TID) | ORAL | Status: DC
  Administered 2013-10-28 (×3): 5 mg via ORAL
  Filled 2013-10-28 (×7): qty 1

## 2013-10-28 NOTE — Progress Notes (Signed)
Social Work Patient ID: Katherine Potts, female   DOB: September 04, 1960, 53 y.o.   MRN: [id number]  Have reviewed team conference with pt and discussed targeted d/c date of 9/8 with supervision goals overall.  Pt pleased with date and goals.  She feels confident that her son can provide the needed assistance, however, she does have concerns that "he tries to do too much..."  She requested that I speak with him about support resources he might access.  Per PT Hosie Spangle), pt's brother expressing concerns yesterday about son's ability to provide care and son's coping issues as well.   Have reviewed what skilled services I will arrange for home.  Pt feels she is "...doing better than I was (PTA)".  Have left VM for brother to further discuss any concerns he has - await contact back.  Have attempted to reach son at the home, however, no answer and no way to leave message - will continue to try.  Katherine Weins, LCSW

## 2013-10-28 NOTE — Progress Notes (Signed)
Occupational Therapy Session Note  Patient Details  Name: Katherine Potts MRN: [id number] Date of Birth: July 28, 1960  Today's Date: 10/28/2013 OT Individual Time:1000-1100   Short Term Goals: Week 1:  OT Short Term Goal 1 (Week 1): Patient will complete lower body bathing with min assist OT Short Term Goal 2 (Week 1): Patient will dress lower body at edge of bed with mod assist OT Short Term Goal 3 (Week 1): Patient will demo ability to complete HEP for improved [facility] of left hand OT Short Term Goal 4 (Week 1): Patient will complete 4 of 4 grooming tasks with min assist   Skilled Therapeutic Interventions/Progress Updates:    Pt engaged in BADL retraining including toilet transfers, toileting, shower transfers, bathing at shower level, and dressing with sit<>stand from w/c.  Pt directed care and assistance PRN throughout process this morning.  Pt required encouragement to complete tasks versus asking for assistance with tasks she was capable of completing.  Pt used AE (long handle sponge) appropriately to complete bathing tasks.  Pt directed donning of wrist cock-up splint.  Focus on activity tolerance, transfers, sit<>stand, standing balance, and safety awareness.  Therapy Documentation Precautions:  Precautions Precautions: Fall Precaution Comments: Recent increase in numbers of falls Required Braces or Orthoses: Other Brace/Splint Other Brace/Splint: Left wrist cock-up splint while up; resting hand splint @ night to prevent contractures Restrictions Weight Bearing Restrictions: No Pain: Pain Assessment Pain Assessment: 0-10 Pain Score: 3  Pain Type: Chronic pain Pain Location: Neck Pain Orientation: Lower Pain Onset: On-going Pain Intervention(s): Distraction;Rest Rn aware ADL: ADL ADL Comments: see FIM  See FIM for current functional status  Therapy/Group: Individual Therapy  Rich Brave 10/28/2013, 3:09 PM

## 2013-10-28 NOTE — Progress Notes (Signed)
Physical Therapy Session Note  Patient Details  Name: Katherine Potts MRN: [id number] Date of Birth: 07/24/1960  Today's Date: 10/28/2013 PT Individual Time: 1133-1203 PT Individual Time Calculation (min): 30 min   Short Term Goals: Week 1:  PT Short Term Goal 1 (Week 1): Patient will perform bed mobility with supervision. PT Short Term Goal 2 (Week 1): Patient will perform functional transfers with minA. PT Short Term Goal 3 (Week 1): Patient will perform gait training 100' x1 with minA.  Skilled Therapeutic Interventions/Progress Updates:    Pt received seated in w/c, agreeable to participate in therapy. Session focused on bed mobility, BLE tone management, ambulation. Pt transported to rehab gym via w/c, performed SPT w/c>mat table w/ MinGuard and RW w/ L HO. Performed 2x10 heel slides w/ flat foot for HS activation, plantarflexor stretch, quad stretch while seated EOM. Pt moved sit>supine>prone w/ MinA, performed 2x10 HS Curls against gravity for active quad stretching and tone management. Pt moved prone>supine w/ MinA, performed x10 bridges for proximal strengthening. Supine>sit w/ MinA, ambulated 65' w/ RW w/ L HO and MinA for safety. Pt transported back to room via w/c, left seated in w/c w/ all needs within reach.  Therapy Documentation Precautions:  Precautions Precautions: Fall Precaution Comments: Recent increase in numbers of falls Required Braces or Orthoses: Other Brace/Splint Other Brace/Splint: Left wrist cock-up splint while up; resting hand splint @ night to prevent contractures Restrictions Weight Bearing Restrictions: No General:   Vital Signs: Therapy Vitals Temp: 98.1 F (36.7 C) Temp src: Oral Pulse Rate: 75 Resp: 20 BP: 110/83 mmHg Oxygen Therapy SpO2: 99 % O2 Device: None (Room air) Pain:   Mobility:   Locomotion :    Trunk/Postural Assessment :    Balance:   Exercises:   Other Treatments:    See FIM for current functional  status  Therapy/Group: Individual Therapy  Hosie Spangle Hosie Spangle, PT, DPT 10/28/2013, 7:43 AM

## 2013-10-28 NOTE — Progress Notes (Signed)
Physical Therapy Session Note  Patient Details  Name: Katherine Potts MRN: [id number] Date of Birth: [birth year]  Today's Date: 10/28/2013 PT Individual Time: 1405-1505 PT Individual Time Calculation (min): 60 min   Short Term Goals: Week 1:  PT Short Term Goal 1 (Week 1): Patient will perform bed mobility with supervision. PT Short Term Goal 2 (Week 1): Patient will perform functional transfers with minA. PT Short Term Goal 3 (Week 1): Patient will perform gait training 100' x1 with minA.  Skilled Therapeutic Interventions/Progress Updates:    Gait Training: PT instructs pt in ambulation with RW and L HO due to pt wearing wrist immobilizer splint x 35' + 55' req CGA for safety. Pt demonstrates decreased R ankle dorsiflexion and R foot inversion/R hip internal rotation, as well as verbal cues due to progressively holding RW further away from body.   W/C Management:  PT instructs pt in w/c propulsion with B LEs (cushion removed) and R UE x 50' req SBA- slow pace noted due to difficulty with coordination. Pt demonstrates difficulty locking L brake due to wrist immobilizer and difficulty swinging away legrests due to difficulty finding the lever under the w/c and impaired hand movement.   Therapeutic Exercise: PT reviews pt's current B LE HEP and instructs her in new exercises: heel slides x 5, SLR x 5, bridges x 5, B knees side to side x 5, ankle DF stretch with blue theraband x 1 minute each, hamstring stretch x 1 minute each.   Therapeutic Activity: PT instructs pt in sit to stand with RW from w/c req CGA-SBA for safety and in w/c to/from mat transfer req CGA with RW for safety. PT instructs pt in sit to/from supine transfer req SBA for safety and slowed speed.   Pt is unable to perform gastroc stretch and hamstring stretch independently; son will need to do these stretches with pt. Pt will benefit from generalized strengthening, activity tolerance training, and safety with gait and  transfers.   Therapy Documentation Precautions:  Precautions Precautions: Fall Precaution Comments: Recent increase in numbers of falls Required Braces or Orthoses: Other Brace/Splint Other Brace/Splint: Left wrist cock-up splint while up; resting hand splint @ night to prevent contractures Restrictions Weight Bearing Restrictions: No Pain: Pain Assessment Pain Assessment: 0-10 Pain Score: 3  Pain Type: Chronic pain Pain Location: Neck Pain Orientation: Lower Pain Onset: On-going Pain Intervention(s): Distraction;Rest Multiple Pain Sites: No    See FIM for current functional status  Therapy/Group: Individual Therapy  Denzil Bristol M 10/28/2013, 2:10 PM

## 2013-10-28 NOTE — Progress Notes (Signed)
[place] PHYSICAL MEDICINE & REHABILITATION     PROGRESS NOTE    Subjective/Complaints: Pain better controlled. Still with spasms in lower ext's  Objective: Vital Signs: Blood pressure 110/83, pulse 75, temperature 98.1 F (36.7 C), temperature source Oral, resp. rate 20, height  (1.626 m), weight 65.4 kg (144 lb 2.9 oz), SpO2 99.00%. No results found. No results found for this basename: WBC, HGB, HCT, PLT,  in the last 72 hours  Recent Labs  10/28/13 0511  CREATININE 1.26*   CBG (last 3)  No results found for this basename: GLUCAP,  in the last 72 hours  Wt Readings from Last 3 Encounters:  10/23/13 65.4 kg (144 lb 2.9 oz)  10/20/13 63.322 kg (139 lb 9.6 oz)  07/09/13 65.772 kg (145 lb)    Physical Exam:  53 year old frail female  HENT: oral mucosa pink/moist  Head: Normocephalic.  Eyes: EOM are normal.  Neck: Normal range of motion. Neck supple. No thyromegaly present.  Cardiovascular: Normal rate and regular rhythm.  Respiratory: Effort normal and breath sounds normal. No respiratory distress.  GI: Soft. Bowel sounds are normal. She exhibits no distension.  Neurological:  Reflex Scores:  Tricep reflexes are 3+ on the right side and 3+ on the left side.  Bicep reflexes are 3+ on the right side and 3+ on the left side.  Brachioradialis reflexes are 3+ on the right side and 3+ on the left side.  Patellar reflexes are 3+ on the right side and 3+ on the left side.  Achilles reflexes are 4+ on the right side and 4+ on the left side.  RUE 5/5 in deltoid Bi, tri grip is 3 to 3+  LUE 3- delt, 4- Bi, tri, 0/5 L WE, FE, 2- FF  RLE 4/5 HF, KE, 2- ankle DF  LLE 4/5 HF KE, ADF   Inconsistent tone in LE's. Pt activated left leg and tone disappeared  Clonus Bilateral ankles  Intact PP/LT in all 4's.  Fair insight and awareness, perhaps a little impulsive  Skin: bruise around right eye, a few other scattered bruises on the extremities.  Musc: left wrist/hand splint in  place, tight wrist  Psych: patient pleasant and cooperative, a little anxious/impulsive   Assessment/Plan: 1. 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 Physiatrist is providing close team supervision and 24 hour management of active medical problems listed below. Physiatrist and rehab team continue to assess barriers to discharge/monitor patient progress toward functional and medical goals.  Reviewed powered wheelchair---would be wise to wait on ordering chair given that potential needs/specs are difficult to determine for "future"  FIM: FIM - Bathing Bathing Steps Patient Completed: Chest;Right Arm;Left Arm;Abdomen;Front perineal area;Buttocks;Right upper leg;Left upper leg;Right lower leg (including foot);Left lower leg (including foot) Bathing: 6: Assistive device (Comment) (using LH sponge)  FIM - Upper Body Dressing/Undressing Upper body dressing/undressing steps patient completed: Thread/unthread right sleeve of pullover shirt/dresss;Thread/unthread left sleeve of pullover shirt/dress;Pull shirt over trunk Upper body dressing/undressing: 4: Min-Patient completed 75 plus % of tasks FIM - Lower Body Dressing/Undressing Lower body dressing/undressing steps patient completed: Thread/unthread right underwear leg;Thread/unthread right pants leg Lower body dressing/undressing: 1: Total-Patient completed less than 25% of tasks (due to BLE hypertonicity)  FIM - Toileting Toileting steps completed by patient: Adjust clothing prior to toileting;Performs perineal hygiene;Adjust clothing after toileting Toileting Assistive Devices: Grab bar or rail for support Toileting:  5: Supervision: Safety issues/verbal cues  FIM - Diplomatic Services operational officer Devices: Bedside commode Toilet Transfers: 4-To toilet/BSC: Min A (steadying Pt. > 75%);4-From  toilet/BSC: Min A (steadying Pt. > 75%)  FIM - Bed/Chair Transfer Bed/Chair Transfer Assistive Devices: Bed rails;HOB elevated;Walker Bed/Chair Transfer: 4: Sit > Supine: Min A (steadying pt. > 75%/lift 1 leg);4: Chair or W/C > Bed: Min A (steadying Pt. > 75%)  FIM - Locomotion: Wheelchair Distance: 60 Locomotion: Wheelchair: 1: Total Assistance/staff pushes wheelchair (Pt<25%) FIM - Locomotion: Ambulation Locomotion: Ambulation Assistive Devices: Designer, industrial/product Ambulation/Gait Assistance: 4: Min assist Locomotion: Ambulation: 1: Travels less than 50 ft with minimal assistance (Pt.>75%)  Comprehension Comprehension Mode: Auditory Comprehension: 6-Follows complex conversation/direction: With extra time/assistive device  Expression Expression Mode: Verbal Expression: 6-Expresses complex ideas: With extra time/assistive device  Social Interaction Social Interaction: 6-Interacts appropriately with others with medication or extra time (anti-anxiety, antidepressant).  Problem Solving Problem Solving: 6-Solves complex problems: With extra time  Memory Memory: 5-Recognizes or recalls 90% of the time/requires cueing < 10% of the time  Medical Problem List and Plan:  1. 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 Additional deconditioning after medical issues above  2. DVT Prophylaxis/Anticoagulation: Lovenox 40 mg daily. Monitor platelet counts and any signs of  3. Pain Management/chronic pain syndrome: Lyrica 150 mg 2 times a day---decrease to , Methadone 5 mg every 8 hours as needed (home dose), oxycodone 10 mg every 4 hours as needed, Valium 5 mg every 12 hours as needed   -continue lidoderm patch for back  -k pad  -continue decadron  -xrays of c and t spine without acute findings  -pt feels methadone is effective.  -dc robaxin---trial of baclofen for LE 4. Mood/depression: Abilify 10 mg daily, Prozac 20 mg  daily, Ativan 2 mg 3 times a day as needed. Provide emotional support  5. Neuropsych: This patient is capable of making decisions on her own behalf.  6. Skin/Wound Care: Routine skin care. Patient multiple bruises from falls  7. History of ileus with nonspecific abdominal pain. Continue Bentyl 10 mg 3 times a day as well as nexium  8. Left wrist fracture April 2015. Continue splint conservative care/weightbearing as tolerated  9. Nausea/GERD---nexium bid  -rx nausea prn,  -monitor po intake, encouraged her to eat food from home if she wishes.   LOS (Days) 7 A FACE TO FACE EVALUATION WAS PERFORMED  Jamie Hafford T 10/28/2013 8:04 AM

## 2013-10-28 NOTE — Patient Care Conference (Signed)
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 Nausea & vomiting 10/19/2013  . Intractable nausea and vomiting 10/19/2013  . Closed fracture of left distal radius 07/09/2013  . 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 Weakness of hand 12/31/2012  . Incisional hernia, without obstruction or gangrene 07/02/2012  . GERD (gastroesophageal reflux disease) 03/06/2012  . Depression 03/06/2012    Expected Discharge Date: Expected Discharge Date: 11/03/13  Team Members Present: Physician leading conference: Dr. Faith Rogue Social Worker Present: Amada Jupiter, LCSW Nurse Present: Carlean Purl, RN PT Present: Hosie Spangle, PT OT Present: Donzetta Kohut, OT;Jennifer Katrinka Blazing, OT SLP Present: Fae Pippin, SLP PPS Coordinator present : Tora Duck, RN, CRRN     Current Status/Progress Goal Weekly Team Focus  Medical   ALS, deconditioning, chronic pain  improve pain, tone control  tone, pain, skin care   Bowel/Bladder   Pt continent of bowel and bladder- Prior issues with constipation; enema given 8/30 with large result.  manange b/b with minimal assist  continue stool softeners and POC   Swallow/Nutrition/ Hydration             ADL's   Min A bathing, setup upper body dressing, max-mod for lower body dressing, supervision for transfers  overall supervision for BADL, transfers  Improved  endurance, inproved dynamiic sitting/standing balance, increased BUE strength, increased FMC of bil UE, AE retraining   Mobility   Min-ModA for short distance ambulation, MinA for transfers and bed mobility, Mod for w/c propulsion short distances  MinA ambulation, S for w/c propulsion, bed mobility, transfers  Standing balance, ambulation, tone management   Communication             Safety/Cognition/ Behavioral Observations            Pain   scheduled methadone  TID, Lyrica  BID, Lidocaine patchs to back, oxycodone  prn q4hrs, Heating pad to back  manage pain 5 or less out of 10 with medications and heating pad  continue medications as scheduled and POC   Skin   scattered bruising  remain free of skin breakdown  assess skin qshift and continue POC    Rehab Goals Patient on target to meet rehab goals: Yes *See Care Plan and progress notes for long and short-term goals.  Barriers to Discharge: progressive disease, clonus    Possible Resolutions to Barriers:  safety awareness, adaptive equipment, tone control    Discharge Planning/Teaching Needs:  home with 79 yr old son to provide primary care 24/7  to be aranged prior to d/c   Team Discussion:  Tone (which seems to fluctuate in intensity) causing problems with gait.  Anticipated mixed presentation (physically and cognitively) with ALS.  Some improvement in land hand function.  Goals of supervision overall.  Discussion of benefit of a power w/c.  PT and MD to discuss further.  Revisions to Treatment Plan:  None   Continued Need for Acute Rehabilitation Level of Care: The patient requires daily medical management by a physician with specialized training in physical medicine and rehabilitation for the following conditions: Daily direction of a multidisciplinary physical rehabilitation program to ensure safe treatment while eliciting the highest outcome that is of practical value to the patient.: Yes Daily medical  management of patient stability for increased activity during participation in an intensive rehabilitation regime.: Yes Daily analysis of laboratory values and/or radiology reports with any subsequent need for medication adjustment of medical intervention for : Neurological problems;Other  Tamkia Temples 10/28/2013, 6:35 AM

## 2013-10-28 NOTE — Progress Notes (Signed)
Occupational Therapy Session Note  Patient Details  Name: Katherine Potts MRN: [id number] Date of Birth: [birth year]  Today's Date: 10/28/2013 OT Individual Time: 1300-1335 OT Individual Time Calculation (min): 35 min   Short Term Goals: Week 1:  OT Short Term Goal 1 (Week 1): Patient will complete lower body bathing with min assist OT Short Term Goal 2 (Week 1): Patient will dress lower body at edge of bed with mod assist OT Short Term Goal 3 (Week 1): Patient will demo ability to complete HEP for improved [facility] of left hand OT Short Term Goal 4 (Week 1): Patient will complete 4 of 4 grooming tasks with min assist   Skilled Therapeutic Interventions/Progress Updates:  Patient resting in w/c upon arrival having just finished her lunch.  Patient requested to brush her teeth at the sink and was able to complete this grooming task after set up.  Engaged in BUE PROM, AROM and self ROM exercises as well as positioning instruction.  Issued 2 stockinet pieces to wear under left wrist brace and night resting hand splint.    Therapy Documentation Precautions:  Precautions Precautions: Fall Precaution Comments: Recent increase in numbers of falls Required Braces or Orthoses: Other Brace/Splint Other Brace/Splint: Left wrist cock-up splint while up; resting hand splint @ night to prevent contractures Restrictions Weight Bearing Restrictions: No Pain: No report of pain.  Therapy/Group: Individual Therapy  Lakin Rhine 10/28/2013, 3:30 PM

## 2013-10-28 NOTE — Progress Notes (Signed)
Social Work Patient ID: Laurell Josephs, female   DOB: 18-Aug-1960, 53 y.o.   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  Nausea & vomiting  10/19/2013   .  Intractable nausea and vomiting  10/19/2013   .  Closed fracture of left distal radius  07/09/2013   .  ALS (amyotrophic lateral sclerosis)  01/08/2013   .  Weakness of hand  12/31/2012   .  Incisional hernia, without obstruction or gangrene  07/02/2012   .  GERD (gastroesophageal reflux disease)  03/06/2012   .  Depression  03/06/2012     Expected Discharge Date: Expected Discharge Date: 11/03/13  Team Members Present: Physician leading conference: Dr. Faith Rogue Social Worker Present: Amada Jupiter, LCSW Nurse Present: Carlean Purl, RN PT Present: Hosie Spangle, PT OT Present: Donzetta Kohut, OT;Jennifer Katrinka Blazing, OT SLP Present: Fae Pippin, SLP PPS Coordinator present : Tora Duck, RN, CRRN        Current Status/Progress  Goal  Weekly Team Focus   Medical     ALS, deconditioning, chronic pain  improve pain, tone control  tone, pain, skin care   Bowel/Bladder     Pt continent of bowel and bladder- Prior issues with constipation; enema given 8/30 with large result.  manange b/b with minimal assist   continue stool softeners and POC   Swallow/Nutrition/ Hydration            ADL's     Min A bathing, setup upper body dressing, max-mod for lower body dressing, supervision for transfers  overall supervision for BADL, transfers  Improved endurance, inproved dynamiic sitting/standing balance, increased BUE strength, increased FMC of bil UE, AE retraining   Mobility     Min-ModA for short distance ambulation, MinA for transfers and bed mobility, Mod for w/c propulsion short distances  MinA ambulation, S for w/c propulsion, bed mobility, transfers  Standing balance, ambulation, tone management   Communication            Safety/Cognition/ Behavioral Observations           Pain     scheduled methadone  TID, Lyrica  BID, Lidocaine patchs to back, oxycodone  prn q4hrs, Heating pad to back  manage pain 5 or less out of 10 with medications and heating pad  continue medications as scheduled and POC   Skin     scattered bruising  remain free of skin breakdown  assess skin qshift and continue POC    Rehab Goals Patient on target to meet rehab goals: Yes *See Care Plan and progress notes for long and short-term goals.    Barriers to Discharge:  progressive disease, clonus      Possible Resolutions  to Barriers:    safety awareness, adaptive equipment, tone control      Discharge Planning/Teaching Needs:    home with 57 yr old son to provide primary care 24/7   to be aranged prior to d/c    Team Discussion:    Tone (which seems to fluctuate in intensity) causing problems with gait.  Anticipated mixed presentation (physically and cognitively) with ALS.  Some improvement in land hand function.  Goals of supervision overall.  Discussion of benefit of a power w/c.  PT and MD to discuss further.   Revisions to Treatment Plan:    None    Continued Need for Acute Rehabilitation Level of Care: The patient requires daily medical management by a physician with specialized training in  physical medicine and rehabilitation for the following conditions: Daily direction of a multidisciplinary physical rehabilitation program to ensure safe treatment while eliciting the highest outcome that is of practical value to the patient.: Yes Daily medical management of patient stability for increased activity during participation in an intensive rehabilitation regime.: Yes Daily analysis of laboratory values and/or radiology reports with any subsequent need for medication adjustment of medical intervention for : Neurological problems;Other  Frazier Balfour 10/28/2013, 6:35 AM

## 2013-10-29 ENCOUNTER — Inpatient Hospital Stay (HOSPITAL_COMMUNITY): Payer: Medicare Other

## 2013-10-29 ENCOUNTER — Inpatient Hospital Stay (HOSPITAL_COMMUNITY): Payer: Medicare Other | Admitting: Occupational Therapy

## 2013-10-29 ENCOUNTER — Inpatient Hospital Stay (HOSPITAL_COMMUNITY): Payer: Medicare Other | Admitting: Physical Therapy

## 2013-10-29 MED ORDER — BACLOFEN 10 MG PO TABS
10.0000 mg | ORAL_TABLET | Freq: Three times a day (TID) | ORAL | Status: DC
  Administered 2013-10-29 – 2013-10-31 (×9): 10 mg via ORAL
  Filled 2013-10-29 (×12): qty 1

## 2013-10-29 MED ORDER — SORBITOL 70 % SOLN
30.0000 mL | Freq: Every day | Status: DC
Start: 2013-10-29 — End: 2013-11-03
  Administered 2013-10-29 – 2013-10-30 (×2): 30 mL via ORAL
  Filled 2013-10-29 (×9): qty 30

## 2013-10-29 NOTE — Progress Notes (Signed)
Occupational Therapy Session Note  Patient Details  Name: Katherine Potts MRN: [id number] Date of Birth: May 26, 1960  Today's Date: 10/29/2013 OT Individual Time: 4098-1191 OT Individual Time Calculation (min): 36 min    Skilled Therapeutic Interventions/Progress Updates:    Pt began session by working on doffing her socks and donning her slip on shoes.  She needed min facilitation to doff the left sock by crossing her leg over the right knee.  She was unable to efficiently hook her fingers into the sock using her right hand and therapist had to assist with positioning the digits into the sock.  She needed mod assist to remove the right sock.  [name] was able to donn her shoes on both feet but could not use her finger or the LH shoe horn to adequately place her heel in the shoe.  Therapist provided mod assist.  Pt performed all transfers stand pivot with the RW and min assist during session.  Therapist performed PROM stretching and joint mobilizations to the carpals and MP joints of the left hand.  Then had her work on weightbearing through the left palm with the hand placed on the mat.  Pt was asked to reach across midline with the RUE while supporting her weight with the LUE somewhat.  Pt able to perform with supervision.  Noted increased PIP/DIP flexor tone as well as collapsed transverse arch.  Discussed with pt to work on digit stretching and active extension and to avoid using squeeze ball or foam at this time for grasp strengthening as to not promote greater flexion tone.   Therapy Documentation Precautions:  Precautions Precautions: Fall Precaution Comments: Recent increase in numbers of falls Required Braces or Orthoses: Other Brace/Splint Other Brace/Splint: Left wrist cock-up splint while up; resting hand splint @ night to prevent contractures Restrictions Weight Bearing Restrictions: No   Pain Assessment Pain Assessment: No/denies pain   See FIM for current functional  status  Therapy/Group: Individual Therapy  Tisa Weisel OTR/L 10/29/2013, 5:13 PM

## 2013-10-29 NOTE — Progress Notes (Signed)
Occupational Therapy Weekly Progress Note  Patient Details  Name: Katherine Potts MRN: [id number] Date of Birth: 12/09/1960  Beginning of progress report period: October 21, 2013 End of progress report period: October 29, 2013  Today's Date: 10/29/2013 OT Individual Time: 1100-1200 OT Individual Time Calculation (min): 60 min    Patient has met 3 of 4 short term goals.  Pt unable to progress with lower body dressing goal; long-term goal downgraded to reflect need for max assist.   Patient continues to demonstrate the following deficits: Impaired Waldo and gross strength at hands, impaired dynamic standing balance, impaired lower body dressing skills and therefore will continue to benefit from skilled OT intervention to enhance overall performance with BADL.  Patient progressing toward long term goals..  Continue plan of care.  OT Short Term Goals Week 1:  OT Short Term Goal 1 (Week 1): Patient will complete lower body bathing with min assist OT Short Term Goal 1 - Progress (Week 1): Met OT Short Term Goal 2 (Week 1): Patient will dress lower body at edge of bed with mod assist OT Short Term Goal 2 - Progress (Week 1): Partly met OT Short Term Goal 3 (Week 1): Patient will demo ability to complete HEP for improved [facility] of left hand OT Short Term Goal 3 - Progress (Week 1): Met OT Short Term Goal 4 (Week 1): Patient will complete 4 of 4 grooming tasks with min assist  OT Short Term Goal 4 - Progress (Week 1): Met Week 2:  OT Short Term Goal 1 (Week 2): STG=LTG due to anticiapted discharge within 7 days.  Skilled Therapeutic Interventions/Progress Updates: ADL-retraining with focus on improved performance with dressing tasks sitting/standing.  Planned educational session with pt's son was deferred d/t report of mechanical problems with family car, per phone call from son.   Per pt, her boyfriend Gwyndolyn Saxon also assists with her bathing and dressing, when requested.   Pt received seated in w/c  and ambulated to shower with min guard assist.   Pt transferred to tub bench with supervision and performed shower sitting and standing using grab bar for support while washing peri-area and buttocks.   Pt able to cross her legs to wash each foot this session.   Pt returned to w/c to dress at sink side and requires max assist for lower body to lace underwear and pants and requires assist to pull up pants.   Pt struggled with donning her shirt this session although acknowledging benefit of loose fitting clothing which she intends to purchase s/p discharge.   Pt groomed at sink and was left in w/c as setup for self feeding as food tray arrived.  OT provided foam tubing and installed it on her plastic spoon this date although pt reports she has large grip silverware she uses at her home.     Therapy Documentation Precautions:  Precautions Precautions: Fall Precaution Comments: Recent increase in numbers of falls Required Braces or Orthoses: Other Brace/Splint Other Brace/Splint: Left wrist cock-up splint while up; resting hand splint @ night to prevent contractures Restrictions Weight Bearing Restrictions: No  Vital Signs: Therapy Vitals Temp: 98.6 F (37 C) Temp src: Oral Pulse Rate: 86 Resp: 17 BP: 113/79 mmHg Patient Position (if appropriate): Lying Oxygen Therapy SpO2: 98 % O2 Device: None (Room air)  Pain: Pain Assessment Pain Assessment: 0-10 Pain Score: 2  Pain Type: Chronic pain Pain Location: Neck Pain Onset: On-going Pain Intervention(s): Rest Multiple Pain Sites: No  ADL: ADL ADL Comments:  see FIM  See FIM for current functional status  Therapy/Group: Individual Therapy  Tyler Run 10/29/2013, 3:27 PM

## 2013-10-29 NOTE — Progress Notes (Signed)
[place] PHYSICAL MEDICINE & REHABILITATION     PROGRESS NOTE    Subjective/Complaints: Happy with pain control. Still having spasms in legs.  Feels she's getting stronger.  Objective: Vital Signs: Blood pressure 100/75, pulse 82, temperature 98.1 F (36.7 C), temperature source Oral, resp. rate 18, height  (1.626 m), weight 65.772 kg (145 lb), SpO2 97.00%. No results found. No results found for this basename: WBC, HGB, HCT, PLT,  in the last 72 hours  Recent Labs  10/28/13 0511  CREATININE 1.26*   CBG (last 3)  No results found for this basename: GLUCAP,  in the last 72 hours  Wt Readings from Last 3 Encounters:  10/28/13 65.772 kg (145 lb)  10/20/13 63.322 kg (139 lb 9.6 oz)  07/09/13 65.772 kg (145 lb)    Physical Exam:  53 year old frail female  HENT: oral mucosa pink/moist  Head: Normocephalic.  Eyes: EOM are normal.  Neck: Normal range of motion. Neck supple. No thyromegaly present.  Cardiovascular: Normal rate and regular rhythm.  Respiratory: Effort normal and breath sounds normal. No respiratory distress.  GI: Soft. Bowel sounds are normal. She exhibits no distension.  Neurological:  Reflex Scores:  Tricep reflexes are 3+ on the right side and 3+ on the left side.  Bicep reflexes are 3+ on the right side and 3+ on the left side.  Brachioradialis reflexes are 3+ on the right side and 3+ on the left side.  Patellar reflexes are 3+ on the right side and 3+ on the left side.  Achilles reflexes are 4+ on the right side and 4+ on the left side.  RUE 5/5 in deltoid Bi, tri grip is 3 to 3+  LUE 3- delt, 4- Bi, tri, 0/5 L WE, FE, 2- FF  RLE 4/5 HF, KE, 2- ankle DF  LLE 4/5 HF KE, ADF   Inconsistent tone in LE's. Pt activated left leg and tone disappeared  Clonus Bilateral ankles  Intact PP/LT in all 4's.  Fair insight and awareness, perhaps a little impulsive  Skin: bruise around right eye, a few other scattered bruises on the extremities.  Musc: left  wrist/hand splint in place, tight wrist  Psych: patient pleasant and cooperative, a little anxious/impulsive   Assessment/Plan: 1. 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 Physiatrist is providing close team supervision and 24 hour management of active medical problems listed below. Physiatrist and rehab team continue to assess barriers to discharge/monitor patient progress toward functional and medical goals.     FIM: FIM - Bathing Bathing Steps Patient Completed: Chest;Right Arm;Left Arm;Abdomen;Front perineal area;Buttocks;Right upper leg;Left upper leg;Left lower leg (including foot);Right lower leg (including foot) Bathing: 6: Assistive device (Comment)  FIM - Upper Body Dressing/Undressing Upper body dressing/undressing steps patient completed: Thread/unthread right sleeve of pullover shirt/dresss;Thread/unthread left sleeve of pullover shirt/dress;Pull shirt over trunk Upper body dressing/undressing: 4: Min-Patient completed 75 plus % of tasks FIM - Lower Body Dressing/Undressing Lower body dressing/undressing steps patient completed: Thread/unthread right underwear leg;Thread/unthread right pants leg;Pull underwear up/down;Pull pants up/down Lower body dressing/undressing: 2: Max-Patient completed 25-49% of tasks  FIM - Toileting Toileting steps completed by patient: Adjust clothing prior to toileting;Performs perineal hygiene;Adjust clothing after toileting Toileting Assistive Devices: Grab bar or rail for support Toileting: 5: Supervision: Safety issues/verbal cues  FIM - Diplomatic Services operational officer Devices: Elevated toilet seat Toilet Transfers: 4-To toilet/BSC: Min  A (steadying Pt. > 75%);4-From toilet/BSC: Min A (steadying Pt. > 75%)  FIM - Banker Devices: Walker;Arm  rests Bed/Chair Transfer: 4: Bed > Chair or W/C: Min A (steadying Pt. > 75%)  FIM - Locomotion: Wheelchair Distance: 50 Locomotion: Wheelchair: 2: Travels 50 - 149 ft with supervision, cueing or coaxing FIM - Locomotion: Ambulation Locomotion: Ambulation Assistive Devices: Walker - Rolling;Orthosis (L HO) Ambulation/Gait Assistance: 4: Min assist Locomotion: Ambulation: 2: Travels 50 - 149 ft with minimal assistance (Pt.>75%)  Comprehension Comprehension Mode: Auditory Comprehension: 6-Follows complex conversation/direction: With extra time/assistive device  Expression Expression Mode: Verbal Expression: 6-Expresses complex ideas: With extra time/assistive device  Social Interaction Social Interaction: 6-Interacts appropriately with others with medication or extra time (anti-anxiety, antidepressant).  Problem Solving Problem Solving: 6-Solves complex problems: With extra time  Memory Memory: 5-Requires cues to use assistive device  Medical Problem List and Plan:  1. 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 Additional deconditioning after medical issues above  2. DVT Prophylaxis/Anticoagulation: Lovenox 40 mg daily. Monitor platelet counts and any signs of  3. Pain Management/chronic pain syndrome: Lyrica 150 mg 2 times a day---decrease to , Methadone 5 mg every 8 hours as needed (home dose), oxycodone 10 mg every 4 hours as needed, Valium 5 mg every 12 hours as needed   -continue lidoderm patch for back  -k pad  -continue decadron  -xrays of c and t spine without acute findings  -pt feels methadone is effective.  -increase baclofen to  tid to see if this helps lower ext spasms and clonus 4. Mood/depression: Abilify 10 mg daily, Prozac 20 mg daily, Ativan 2 mg 3 times a day as needed. Provide emotional support  5. Neuropsych: This patient is capable of making decisions on her own behalf.  6. Skin/Wound  Care: Routine skin care. Patient multiple bruises from falls  7. History of ileus with nonspecific abdominal pain. Continue Bentyl 10 mg 3 times a day as well as nexium  8. Left wrist fracture April 2015. Continue splint conservative care/weightbearing as tolerated  9. Nausea/GERD---nexium bid  -rx nausea prn  -monitor po intake, encouraged her to eat food from home if she wishes.   LOS (Days) 8 A FACE TO FACE EVALUATION WAS PERFORMED  SWARTZ,ZACHARY T 10/29/2013 8:40 AM

## 2013-10-29 NOTE — Progress Notes (Signed)
Social Work Patient ID: Katherine Potts, female   DOB: 1960-06-07, 53 y.o.   MRN: [id number]   Met with pt and son this afternoon after son had completed family ed with PT.  Discussed care needs for pt at home and attempted to gauge if son was having any concerns about providing this care.  Reviewed all care including probable need for assist with bathing and toileting.  Throughout discussion, pt continues to express concern that son "live his own life" and speculates on hiring private duty care "so he can do online classes...".  Son with very limited interaction with either pt or myself.  He quickly rejects any suggestion that providing care might be more than he can manage.  When reviewing the [place] and bath aide services I will refer for, son states, "that's all the help I need."   Pt is interested in having names of private duty care agencies - will provide, however, son does not feel this will be needed or affordable.  Pt states that her father and brother could help to pay for this.    Attempted to direct conversation to more coping management needs for both pt and son.  Son non-responsive so tried to express what he might be feeling in regards to coping with mother's ALS, but again, he simply looks at me but does not engage.  Suggested to both that they may want to become involved with Palliative Care program and possible caregiver support programs.  Pt very receptive and agreeable.  Son = no comment.   Will continue to follow for d/c planning and support.  Will discuss possible palliative care consult with Dr. Naaman Plummer tomorrow.  Terri Rorrer, LCSW

## 2013-10-29 NOTE — Progress Notes (Signed)
Physical Therapy Session Note  Patient Details  Name: [name] MRN: [id number] Date of Birth: 03-Aug-1960  Today's Date: 10/29/2013 PT Individual Time: 4098-1191 PT Individual Time Calculation (min): 37 min   Skilled Therapeutic Interventions/Progress Updates:    Pt received seated in w/c; agreeable to therapy. Session focused on increasing safety with functional ambulation, facilitating independence with bed mobility. Donned ACE bandage at R ankle for increased dorsiflexion, consistent RLE clearance during swing phase. Performed gait x15' then x20' with rolling walker and min A, verbal cueing for safe advancement of rolling walker. Required min cueing for safe hand placement with sit<>stand transfers, stand pivot transfers. Blocked practice of supine<>sit with HOB flat, no rails. Initially required min A for RLE management due to weakness; pt gave effective return demonstration of RLE management using LLE. Session ended in pt room, where pt was left semi reclined in bed with 3 rails up, bed alarm on, and all needs within reach.  Therapy Documentation Precautions:  Precautions Precautions: Fall Precaution Comments: Recent increase in numbers of falls Required Braces or Orthoses: Other Brace/Splint Other Brace/Splint: Left wrist cock-up splint while up; resting hand splint @ night to prevent contractures Restrictions Weight Bearing Restrictions: No Vital Signs: Therapy Vitals Temp: 98.6 F (37 C) Temp src: Oral Pulse Rate: 86 Resp: 17 BP: 113/79 mmHg Patient Position (if appropriate): Lying Oxygen Therapy SpO2: 98 % O2 Device: None (Room air) Pain: Pain Assessment Pain Assessment: No/denies pain Pain Score: 2  Pain Type: Chronic pain Pain Location: Neck Pain Onset: On-going Pain Intervention(s): Rest Multiple Pain Sites: No Locomotion : Ambulation Ambulation/Gait Assistance: 4: Min assist   See FIM for current functional status  Therapy/Group: Individual  Therapy  Xavia Kniskern, Lorenda Ishihara 10/29/2013, 4:35 PM

## 2013-10-29 NOTE — Progress Notes (Signed)
Physical Therapy Session Note  Patient Details  Name: Katherine Potts MRN: [id number] Date of Birth: Dec 15, 1960  Today's Date: 10/29/2013 PT Individual Time: 1300-1400 PT Individual Time Calculation (min): 60 min   Short Term Goals: Week 1:  PT Short Term Goal 1 (Week 1): Patient will perform bed mobility with supervision. PT Short Term Goal 2 (Week 1): Patient will perform functional transfers with minA. PT Short Term Goal 3 (Week 1): Patient will perform gait training 100' x1 with minA.  Skilled Therapeutic Interventions/Progress Updates:    Therapeutic Activity: Son and friend present for family training with car transfer. PT instructs son in tips to make car transfer easier including: remove legrests from w/c prior to transfer so w/c can be very close to car, communicate with patient prior to transfer and agree upon technique prior to beginning the transfer, do not allow pt to grab hold of car door because it is mobile, keep your back straight and lift the pt with your legs as needed, have the patient do as much as she can for herself, you can block her knees with your knees when assisting in lowering the patient. Son verbalizes understanding and demonstrates some understanding. Total car transfers done: 6 (3 with son, 3 with PT).   Orthotic Fit: PT trials pt with L carbon fiber anterior shin AFO and pt reports it is comfortable. Pt only had slip on shoes (sketchers), but son agrees to bring lace up tennis shoes to trial B AFOs at a future PT session. Pt demonstrates much improved foot clearance during gait.   Gait Training: PT instructs pt in ambulation with RW, L carbon fiber AFO (see above), and L HO x 35' req min A. Pt also has poor R foot clearance, today, but trial AFO would not fit in current pair of shoes.   Pt demonstrates strange bouts of weakness: sometimes R foot has foot drop/poor clearance, sometimes L foot, sometimes both feet. Due to the progressive nature of ALS and the  fact that there is no cure and no treatment, pt will benefit from trialing B AFOs with lace up tennis shoes to see if this improves her safety with transfers and gait. Continue per PT POC.   Therapy Documentation Precautions:  Precautions Precautions: Fall Precaution Comments: Recent increase in numbers of falls Required Braces or Orthoses: Other Brace/Splint Other Brace/Splint: Left wrist cock-up splint while up; resting hand splint @ night to prevent contractures Restrictions Weight Bearing Restrictions: No Pain: Pain Assessment Pain Assessment: 0-10 Pain Score: 2  Pain Type: Chronic pain Pain Location: Neck Pain Onset: On-going Pain Intervention(s): Rest Multiple Pain Sites: No  See FIM for current functional status  Therapy/Group: Individual Therapy  Kaylor Maiers M 10/29/2013, 1:10 PM

## 2013-10-30 ENCOUNTER — Inpatient Hospital Stay (HOSPITAL_COMMUNITY): Payer: Medicare Other | Admitting: Physical Therapy

## 2013-10-30 ENCOUNTER — Inpatient Hospital Stay (HOSPITAL_COMMUNITY): Payer: Medicare Other

## 2013-10-30 DIAGNOSIS — R5381 Other malaise: Secondary | ICD-10-CM

## 2013-10-30 DIAGNOSIS — Z515 Encounter for palliative care: Secondary | ICD-10-CM

## 2013-10-30 DIAGNOSIS — R5383 Other fatigue: Secondary | ICD-10-CM

## 2013-10-30 MED ORDER — DIAZEPAM 5 MG PO TABS
2.5000 mg | ORAL_TABLET | Freq: Two times a day (BID) | ORAL | Status: DC | PRN
  Administered 2013-10-31 – 2013-11-01 (×2): 5 mg via ORAL
  Filled 2013-10-30 (×2): qty 1

## 2013-10-30 MED ORDER — PREGABALIN 50 MG PO CAPS
50.0000 mg | ORAL_CAPSULE | Freq: Two times a day (BID) | ORAL | Status: DC
  Administered 2013-10-30 – 2013-11-03 (×9): 50 mg via ORAL
  Filled 2013-10-30 (×8): qty 1

## 2013-10-30 NOTE — Consult Note (Signed)
Patient Katherine Potts Katherine Potts      DOB: [birth year]      WUJ:[id number]     Consult Note from the Palliative Medicine Team at [facility]    Consult Requested by: Dr Laruth Bouchard     PCP: Gretel Acre, MD Reason for Consultation:Clarification of GOC and options     Phone [phone]  Assessment of patients Current state: Continued physical and functional decline 2/2 to  ALS diagnosis, faced with advanced directive decisions and anticipatory care needs.  Consult is for review of medical treatment options, clarification of goals of care and end of life issues, disposition and options, and symptom recommendation.  This NP Lorinda Creed reviewed medical records, received report from team, assessed the patient and then meet at the patient's bedside with her step-father Troy Sine  to discuss diagnosis prognosis, AD and HPOA,  GOC, EOL wishes disposition and options.  Attempted to contact son Casimiro Needle multiple times and unable to leave a voice message  A detailed discussion was had today regarding advanced directives.  Concepts specific to code status, artifical feeding and hydration, continued IV antibiotics and rehospitalization was had.  The difference between a aggressive medical intervention path  and a palliative comfort care path for this patient at this time was had.  Values and goals of care important to patient and family were attempted to be elicited.  Concept of Hospice and Palliative Care were discussed  Natural trajectory of the disease process of ALS was discussed.  Questions and concerns addressed.  Hard Choices booklet left for review. Family encouraged to call with questions or concerns.  PMT will continue to support holistically.   Goals of Care: 1.  Code Status:  Full code   2. Scope of Treatment: Patient is open to all available and offered medical interventions to prolong quality life. Patient is a [employer] fund[employer] and acutely aware of her diagnosis and likely trajectory of the  disease.    3. Disposition:  Home with home heath services   4. Symptom Management:  Continue  Medications as written by Dr Riley Kill  1. Anxiety/Agitation:       Ativan 2 mg tid prn  2. Pain:        Oxycodone 10 mg every 4 hrs prn         Lyrica 50 mg bid        Methadone 5 mg bid         [place]odern 5% patch          Valium 2.5 mg every 12 hrs prn (muscle spasm)  3. Bowel Regimen:  Dulcolax supp daily prn 4. Weakness: continue with PT/OT on discharge   5. Psychosocial:  Emotional support offered to patient and family at bedside.  This is a very difficult situation over laddened with  financial concerns and concerns for the welfare of  her 63 yo son.   Patient Documents Completed or Given: Document Given Completed  Advanced Directives Pkt    MOST yes   DNR    Gone from [facility]    Hard Choices      Brief HPI: 53 yo female with PMH of depression, anxiety, chronic pain and ALS.  Admitted through the ED with nausea, vomiting and diarrhea.  Continued physical and functional decline, now participating in inpatient  rehabilitation   ROS:  weakness   PMH:  Past Medical History  Diagnosis Date  . GERD (gastroesophageal reflux disease)   . Anxiety   . Depression   . MVP (  mitral valve prolapse)   . Cervical spondylosis   . 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 Pneumonia   . Constipation      PSH: Past Surgical History  Procedure Laterality Date  . Eye surgery    . Breast surgery    . Vagina surgery      mesh  . Kidney donation      Right nephrectomy  . Abdominal hernia repair    . Orif wrist fracture  07/09/2013    DR Melvyn Novas  . Orif wrist fracture Left 07/09/2013    Procedure: OPEN REDUCTION INTERNAL FIXATION (ORIF) LEFT  WRIST FRACTURE;  Surgeon: Sharma Covert, MD;  Location: [facility] OR;  Service: Orthopedics;  Laterality: Left;   I have reviewed the FH and SH and  If appropriate update it with new information. Allergies  Allergen  Reactions  . Bactrim [Sulfamethoxazole-Trimethoprim] Nausea Only  . Ciprofloxacin Nausea And Vomiting   Scheduled Meds: . ARIPiprazole  2 mg Oral Daily  . baclofen  10 mg Oral TID  . dexamethasone  2 mg Oral Daily  . docusate sodium  100 mg Oral BID  . enoxaparin (LOVENOX) injection  40 mg Subcutaneous Q24H  . esomeprazole  40 mg Oral BID  . FLUoxetine  20 mg Oral Daily  . lidocaine  2 patch Transdermal Q24H  . methadone  5 mg Oral 3 times per day  . pregabalin  50 mg Oral BID  . senna  2 tablet Oral QHS  . sorbitol  30 mL Oral Q2000   Continuous Infusions:  PRN Meds:.acetaminophen, albuterol, bisacodyl, diazepam, LORazepam, magnesium hydroxide, ondansetron (ZOFRAN) IV, ondansetron, oxyCODONE, sodium phosphate, sorbitol    BP 114/76  Pulse 81  Temp(Src) 98.3 F (36.8 C) (Oral)  Resp 18  Ht  (1.626 m)  Wt 65.772 kg (145 lb)  BMI 24.88 kg/m2  SpO2 100%   PPS:30 %   Intake/Output Summary (Last 24 hours) at 10/30/13 1259 Last data filed at 10/30/13 0745  Gross per 24 hour  Intake    360 ml  Output      0 ml  Net    360 ml    Physical Exam:  General: chronically ill appearing, NAD HEENT:  Moist buccal membranes, no exudate Chest:   CTA CVS: RRR Abdomen:soft NT +BS Ext: without edema, spasticity and weakness noted Neuro: alert and oriented X3  Labs: CBC    Component Value Date/Time   WBC 8.8 10/22/2013 0620   WBC 8.7 06/24/2013 1240   RBC 3.79* 10/22/2013 0620   RBC 4.13 06/24/2013 1240   HGB 12.1 10/22/2013 0620   HCT 35.7* 10/22/2013 0620   PLT 190 10/22/2013 0620   MCV 94.2 10/22/2013 0620   MCH 31.9 10/22/2013 0620   MCH 32.9 06/24/2013 1240   MCHC 33.9 10/22/2013 0620   MCHC 34.6 06/24/2013 1240   RDW 12.6 10/22/2013 0620   RDW 13.5 06/24/2013 1240   LYMPHSABS 4.0 10/22/2013 0620   LYMPHSABS 2.0 06/24/2013 1240   MONOABS 0.6 10/22/2013 0620   EOSABS 0.2 10/22/2013 0620   EOSABS 0.1 06/24/2013 1240   BASOSABS 0.0 10/22/2013 0620   BASOSABS 0.0 06/24/2013  1240    BMET    Component Value Date/Time   NA 142 10/22/2013 0620   NA 141 06/24/2013 1240   K 3.9 10/22/2013 0620   CL 108 10/22/2013 0620   CO2 21 10/22/2013 0620   GLUCOSE 99 10/22/2013 0620   GLUCOSE 91  06/24/2013 1240   BUN 14 10/22/2013 0620   BUN 15 06/24/2013 1240   CREATININE 1.26* 10/28/2013 0511   CALCIUM 9.1 10/22/2013 0620   GFRNONAA 48* 10/28/2013 0511   GFRAA 56* 10/28/2013 0511    CMP     Component Value Date/Time   NA 142 10/22/2013 0620   NA 141 06/24/2013 1240   K 3.9 10/22/2013 0620   CL 108 10/22/2013 0620   CO2 21 10/22/2013 0620   GLUCOSE 99 10/22/2013 0620   GLUCOSE 91 06/24/2013 1240   BUN 14 10/22/2013 0620   BUN 15 06/24/2013 1240   CREATININE 1.26* 10/28/2013 0511   CALCIUM 9.1 10/22/2013 0620   PROT 6.5 10/22/2013 0620   PROT 6.5 06/24/2013 1240   ALBUMIN 3.5 10/22/2013 0620   AST 14 10/22/2013 0620   ALT 13 10/22/2013 0620   ALKPHOS 58 10/22/2013 0620   BILITOT 0.2* 10/22/2013 0620   GFRNONAA 48* 10/28/2013 0511   GFRAA 56* 10/28/2013 0511     Time In Time Out Total Time Spent with Patient Total Overall Time  1400 1530 80 min 90 min    Greater than 50%  of this time was spent counseling and coordinating care related to the above assessment and plan.   Lorinda Creed NP  Palliative Medicine Team Team Phone # [phone] Pager [phone]

## 2013-10-30 NOTE — Progress Notes (Signed)
Physical Therapy Session Note  Patient Details  Name: Katherine Potts MRN: [id number] Date of Birth: 09-21-1960  Today's Date: 10/30/2013 PT Individual Time: 4098-1191 PT Individual Time Calculation (min): 30 min   Skilled Therapeutic Interventions/Progress Updates:   Pt received seated in w/c, visiting with friend. Pt requires assist with use of shoe horn to don B slip on shoes (plan for son to bring lace up shoes for trialling AFO tomorrow). W/c mobility using RUE x 50 ft with max A, pt unable to reach floor with BLEs with cushion in place. Pt performed stand pivot transfer w/c <> NuStep using RW with min A. Pt performed NuStep for overall activity tolerance and NMR using RUE intermittently and BLEs, Level 2-3 x 10 min. Pt returned to room and left sitting in w/c with all needs within reach, friend present.    Therapy Documentation Precautions:  Precautions Precautions: Fall Precaution Comments: Recent increase in numbers of falls Required Braces or Orthoses: Other Brace/Splint Other Brace/Splint: Left wrist cock-up splint while up; resting hand splint @ night to prevent contractures Restrictions Weight Bearing Restrictions: No Pain: Pain Assessment Pain Assessment: 0-10 Pain Score: 4  Pain Type: Chronic pain Pain Location: Neck Pain Orientation: Posterior Pain Onset: On-going Pain Intervention(s): Medication (See eMAR) Multiple Pain Sites: No  See FIM for current functional status  Therapy/Group: Individual Therapy  Kerney Elbe 10/30/2013, 4:00 PM

## 2013-10-30 NOTE — Progress Notes (Signed)
Occupational Therapy Session Note  Patient Details  Name: Katherine Potts MRN: [id number] Date of Birth: 12-31-1960  Today's Date: 10/30/2013 OT Individual Time: 1030-1200 OT Individual Time Calculation (min): 90 min    Short Term Goals: Week 2:  OT Short Term Goal 1 (Week 2): STG=LTG due to anticiapted discharge within 7 days.  Skilled Therapeutic Interventions/Progress Updates: ADL-retraining with focus on improved awareness (anticipatory), dynamic sitting balance, improved FMC of BUE, adapted dressing skills, and introduction to AE to improve performance with self-feeding (demo'd use of plate guard, scoop bowl, dicem, large grip silverware, and rocker knife).   Pt received supine in bed however reporting urinary incontinence with need for bath and linen change.   Pt rose to sit at edge of bed and ambulated to shower with min (steadying) assist due to BLE weakness.   Pt performed bathing task with min assist and recovered to w/c at end of bath to dress at sink side.   Pt continues to require max-mod assist to dress with redirection to attend to task sequentially for improved efficiency.  Pt groomed at sink with max assist to apply makeup.    Pt was then escorted to day room in her w/c and received initial ed on use of adapted silverware.  Plan to incorporate trial use of rocker knife and plate guard with self-feeding, as feasible.   Pt was escorted back to her room where she was greeted by RN friend who provided lunch and visit.  RN staff aware of setup for pt to complete self-feeding with friend.     Therapy Documentation Precautions:  Precautions Precautions: Fall Precaution Comments: Recent increase in numbers of falls Required Braces or Orthoses: Other Brace/Splint Other Brace/Splint: Left wrist cock-up splint while up; resting hand splint @ night to prevent contractures Restrictions Weight Bearing Restrictions: No  Pain: Pain Assessment Pain Assessment: 0-10 Pain Score: 4  Pain  Type: Chronic pain Pain Location: Neck Pain Orientation: Posterior Pain Onset: On-going Pain Intervention(s): Medication (See eMAR) Multiple Pain Sites: No  ADL: ADL ADL Comments: see FIM  See FIM for current functional status  Therapy/Group: Individual Therapy  Katherine Potts 10/30/2013, 2:13 PM

## 2013-10-30 NOTE — Progress Notes (Signed)
Physical Therapy Weekly Progress Note  Patient Details  Name: Katherine Potts MRN: [id number] Date of Birth: [birth year]  Today's Date: 10/30/2013 PT Individual Time: 1300-1400 PT Individual Time Calculation (min): 60 min   Short Term Goals: Week 1:  PT Short Term Goal 1 (Week 1): Patient will perform bed mobility with supervision. PT Short Term Goal 1 - Progress (Week 1): Met PT Short Term Goal 2 (Week 1): Patient will perform functional transfers with minA. PT Short Term Goal 2 - Progress (Week 1): Met PT Short Term Goal 3 (Week 1): Patient will perform gait training 100' x1 with minA. PT Short Term Goal 3 - Progress (Week 1): Revised due to lack of progress (LTG revised) Week 2: STG=LTGs  Pt has met 2 of 3 short term goals due to improved coordination, balance, strength, and safety. Reason STG not met include impaired activity tolerance. Pt will benefit from continued PT to work on activity tolerance, balance, posture, and functional mobility.   Skilled Therapeutic Interventions/Progress Updates:    W/C Management: PT instructs pt in w/c propulsion with B LEs/UEs prn x 120' req SBA and significant amount of time.   Therapeutic Activity: PT instructs pt in w/c to mat transfer req CGA with RW and mat to w/c transfer req min A with RW due to fatigue at end of session. Pt demonstrates mod I sit to/from supine transfer on mat, as well as rolling. PT assist pt in toilet transfer w/c to toilet req grab bar, min A, and pants management. Pt incontinent on pad and underwear - CNA called to assist with clothing change.   Therapeutic Exercise: Pt agreeable to B LE strengthening exercises: SLR, side lie hip abduction, bridges, B hip ER/IR in hook lie x 5-10 reps each. PT gave pt written handout so she can continue these at home.   Pt's LTG for controlled environment ambulation downgraded due to low activity tolerance. Pt will benefit from continued safety with functional mobility training and  focus on transfers, w/c management, and short distance gait training. Son has not yet brought in lace up tennis shoes, so AFOs not trialed again. Continue per PT POC.   Therapy Documentation Precautions:  Precautions Precautions: Fall Precaution Comments: Recent increase in numbers of falls Required Braces or Orthoses: Other Brace/Splint Other Brace/Splint: Left wrist cock-up splint while up; resting hand splint @ night to prevent contractures Restrictions Weight Bearing Restrictions: No Pain: Pain Assessment Pain Assessment: 0-10 Pain Score: 4  Pain Type: Chronic pain Pain Location: Neck Pain Orientation: Posterior Pain Onset: On-going Pain Intervention(s): Medication (See eMAR) Multiple Pain Sites: No  See FIM for current functional status  Therapy/Group: Individual Therapy  Icarus Partch M 10/30/2013, 1:05 PM

## 2013-10-30 NOTE — Plan of Care (Signed)
Problem: RH Ambulation Goal: LTG Patient will ambulate in controlled environment (PT) LTG: Patient will ambulate in a controlled environment, # of feet with assistance (PT).  Goal downgraded due to decreased activity tolerance.

## 2013-10-30 NOTE — Progress Notes (Signed)
[place] PHYSICAL MEDICINE & REHABILITATION     PROGRESS NOTE    Subjective/Complaints: Feels that cramping better with baclofen, stretching, splinting  Feels she's getting stronger.  Objective: Vital Signs: Blood pressure 114/76, pulse 81, temperature 98.3 F (36.8 C), temperature source Oral, resp. rate 18, height  (1.626 m), weight 65.772 kg (145 lb), SpO2 100.00%. No results found. No results found for this basename: WBC, HGB, HCT, PLT,  in the last 72 hours  Recent Labs  10/28/13 0511  CREATININE 1.26*   CBG (last 3)  No results found for this basename: GLUCAP,  in the last 72 hours  Wt Readings from Last 3 Encounters:  10/28/13 65.772 kg (145 lb)  10/20/13 63.322 kg (139 lb 9.6 oz)  07/09/13 65.772 kg (145 lb)    Physical Exam:  53 year old frail female  HENT: oral mucosa pink/moist  Head: Normocephalic.  Eyes: EOM are normal.  Neck: Normal range of motion. Neck supple. No thyromegaly present.  Cardiovascular: Normal rate and regular rhythm.  Respiratory: Effort normal and breath sounds normal. No respiratory distress.  GI: Soft. Bowel sounds are normal. She exhibits no distension.  Neurological:  Reflex Scores:  Tricep reflexes are 3+ on the right side and 3+ on the left side.  Bicep reflexes are 3+ on the right side and 3+ on the left side.  Brachioradialis reflexes are 3+ on the right side and 3+ on the left side.  Patellar reflexes are 3+ on the right side and 3+ on the left side.  Achilles reflexes are 4+ on the right side and 4+ on the left side.  RUE 5/5 in deltoid Bi, tri grip is 3 to 3+  LUE 3- delt, 4- Bi, tri, 0/5 L WE, FE, 2- FF  RLE 4/5 HF, KE, 2- ankle DF  LLE 4/5 HF KE, ADF   1-2/4 tone in LE's. Heel cords generally just tight Clonus Bilateral ankles ---decreased in intensity Intact PP/LT in all 4's.  Fair insight and awareness--better focus,  Skin: bruise around right eye, a few other scattered bruises on the extremities.  Musc:  left wrist/hand splint in place, tight wrist  Psych: patient pleasant and cooperative    Assessment/Plan: 1. 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 Physiatrist is providing close team supervision and 24 hour management of active medical problems listed below. Physiatrist and rehab team continue to assess barriers to discharge/monitor patient progress toward functional and medical goals.     FIM: FIM - Bathing Bathing Steps Patient Completed: Chest;Right Arm;Left Arm;Abdomen;Front perineal area;Buttocks;Right upper leg;Left upper leg;Right lower leg (including foot);Left lower leg (including foot) Bathing: 5: Set-up assist to: Open items  FIM - Upper Body Dressing/Undressing Upper body dressing/undressing steps patient completed: Thread/unthread right sleeve of pullover shirt/dresss Upper body dressing/undressing: 2: Max-Patient completed 25-49% of tasks FIM - Lower Body Dressing/Undressing Lower body dressing/undressing steps patient completed: Thread/unthread right underwear leg;Thread/unthread left underwear leg;Thread/unthread right pants leg;Don/Doff left shoe Lower body dressing/undressing: 2: Max-Patient completed 25-49% of tasks  FIM - Toileting Toileting steps completed by patient: Adjust clothing prior to toileting;Performs perineal hygiene Toileting Assistive Devices: Grab bar or rail for support Toileting: 6: Assistive device: No helper  FIM - Diplomatic Services operational officer Devices: Grab bars;Walker Toilet Transfers: 4-To toilet/BSC: Min A (steadying Pt. > 75%);4-From toilet/BSC: Min A (steadying Pt. > 75%)  FIM - Bed/Chair Transfer Bed/Chair  Transfer Assistive Devices: Environmental consultant;Arm rests;Orthosis (L carbon fiber AFO) Bed/Chair Transfer: 4: Chair or W/C > Bed: Min A (steadying Pt. > 75%)  FIM - Locomotion:  Wheelchair Distance: 50 Locomotion: Wheelchair: 1: Total Assistance/staff pushes wheelchair (Pt<25%) FIM - Locomotion: Ambulation Locomotion: Ambulation Assistive Devices: Walker - Rolling;Orthosis (L carbon fiber AFO) Ambulation/Gait Assistance: 4: Min assist Locomotion: Ambulation: 1: Travels less than 50 ft with minimal assistance (Pt.>75%)  Comprehension Comprehension Mode: Auditory Comprehension: 6-Follows complex conversation/direction: With extra time/assistive device  Expression Expression Mode: Verbal Expression: 6-Expresses complex ideas: With extra time/assistive device  Social Interaction Social Interaction: 6-Interacts appropriately with others with medication or extra time (anti-anxiety, antidepressant).  Problem Solving Problem Solving: 6-Solves complex problems: With extra time  Memory Memory: 5-Recognizes or recalls 90% of the time/requires cueing < 10% of the time  Medical Problem List and Plan:  1. 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 Additional deconditioning after medical issues above  2. DVT Prophylaxis/Anticoagulation: Lovenox 40 mg daily. Monitor platelet counts and any signs of  3. Pain Management/chronic pain syndrome: Lyrica 150 mg 2 times a day---decrease to , Methadone 5 mg every 8 hours as needed (home dose), oxycodone 10 mg every 4 hours as needed, Valium 5 mg every 12 hours as needed   -continue lidoderm patch for back  -k pad  -continue decadron  -xrays of c and t spine without acute findings  -continue low dose methadone   - baclofen  tid for ext spasms and clonus 4. Mood/depression: Abilify 10 mg daily, Prozac 20 mg daily, Ativan 2 mg 3 times a day as needed. Provide emotional support   -reviewed SW note from yesterday. Pt spoke to me regarding interest in a palliative care consult. I think this is entirely reasonable. Will consult today. 5. Neuropsych: This patient  is capable of making decisions on her own behalf.  6. Skin/Wound Care: Routine skin care. Patient multiple bruises from falls  7. History of ileus with nonspecific abdominal pain. Continue Bentyl 10 mg 3 times a day as well as nexium  8. Left wrist fracture April 2015. Continue splint conservative care/weightbearing as tolerated  9. Nausea/GERD---nexium bid  -better  LOS (Days) 9 A FACE TO FACE EVALUATION WAS PERFORMED  Montez Stryker T 10/30/2013 7:32 AM

## 2013-10-31 ENCOUNTER — Inpatient Hospital Stay (HOSPITAL_COMMUNITY): Payer: Medicare Other | Admitting: *Deleted

## 2013-10-31 NOTE — Progress Notes (Signed)
Occupational Therapy Session Note  Patient Details  Name: Katherine Potts MRN: [id number] Date of Birth: 1960/06/24  Today's Date: 10/31/2013 OT Individual Time: 1300-1345 OT Individual Time Calculation (min): 45 min    Short Term Goals: Week 1:  OT Short Term Goal 1 (Week 1): Patient will complete lower body bathing with min assist OT Short Term Goal 1 - Progress (Week 1): Met OT Short Term Goal 2 (Week 1): Patient will dress lower body at edge of bed with mod assist OT Short Term Goal 2 - Progress (Week 1): Partly met OT Short Term Goal 3 (Week 1): Patient will demo ability to complete HEP for improved [facility] of left hand OT Short Term Goal 3 - Progress (Week 1): Met OT Short Term Goal 4 (Week 1): Patient will complete 4 of 4 grooming tasks with min assist  OT Short Term Goal 4 - Progress (Week 1): Met Week 2:  OT Short Term Goal 1 (Week 2): STG=LTG due to anticiapted discharge within 7 days.  Skilled Therapeutic Interventions/Progress Updates:    Pt sitting in wc upon OT arrival.  She was dressed in depends and shirt.  She had food strewn all over floor.  She reported she was frustrated because she needed help with eating.  OT organized furniture and positioned pt in better ergonomic position for feeding self.   Pt utilized built up handle for fork but needed mod assist getting food on fork.  She lacked Right hand strength and coordination for spearing food.  Provided a side for pt to push food up against and she was more independent.  May benefit from a scoop plate for future trials.    Transferred to toilet from wc with min assist with pt having urine leakage before sitting on toilet.  Again pt voiced frustration saying this is happening more and more and I can't control it.  Informed RN about this.  Pt stood for 1 minute during application of brief and transferred to wc.  Placed pt in wc beside bed with tray table close so she could drink her coffee from straw.  She reported the medicine  made her sleepy and the coffee was helping her to stay awake.   Informed pt to call if she needed to get back in bed.  Pt verbalized understanding.  Left pt in wc with safety belt on and call bell,phone within reach.       Therapy Documentation Precautions:  Precautions Precautions: Fall Precaution Comments: Recent increase in numbers of falls Required Braces or Orthoses: Other Brace/Splint Other Brace/Splint: Left wrist cock-up splint while up; resting hand splint @ night to prevent contractures Restrictions Weight Bearing Restrictions: No      Pain:  410 back   ADL: ADL ADL Comments: see FIM     Therapy/Group: Individual Therapy  Lisa Roca 10/31/2013, 6:15 PM

## 2013-10-31 NOTE — Progress Notes (Signed)
[place] PHYSICAL MEDICINE & REHABILITATION     PROGRESS NOTE    Subjective/Complaints: Had a reasonable night. Denies new issues today.  Pain remains controlled.   Objective: Vital Signs: Blood pressure 109/77, pulse 72, temperature 98.7 F (37.1 C), temperature source Oral, resp. rate 20, height  (1.626 m), weight 65.772 kg (145 lb), SpO2 99.00%. No results found. No results found for this basename: WBC, HGB, HCT, PLT,  in the last 72 hours No results found for this basename: NA, K, CL, CO, GLUCOSE, BUN, CREATININE, CALCIUM,  in the last 72 hours CBG (last 3)  No results found for this basename: GLUCAP,  in the last 72 hours  Wt Readings from Last 3 Encounters:  10/28/13 65.772 kg (145 lb)  10/20/13 63.322 kg (139 lb 9.6 oz)  07/09/13 65.772 kg (145 lb)    Physical Exam:  53 year old frail female  HENT: oral mucosa pink/moist  Head: Normocephalic.  Eyes: EOM are normal.  Neck: Normal range of motion. Neck supple. No thyromegaly present.  Cardiovascular: Normal rate and regular rhythm.  Respiratory: Effort normal and breath sounds normal. No respiratory distress.  GI: Soft. Bowel sounds are normal. She exhibits no distension.  Neurological:  Reflex Scores:  Tricep reflexes are 3+ on the right side and 3+ on the left side.  Bicep reflexes are 3+ on the right side and 3+ on the left side.  Brachioradialis reflexes are 3+ on the right side and 3+ on the left side.  Patellar reflexes are 3+ on the right side and 3+ on the left side.  Achilles reflexes are 4+ on the right side and 4+ on the left side.  RUE 5/5 in deltoid Bi, tri grip is 3 to 3+  LUE 3- delt, 4- Bi, tri, 1/5 L WE, FE, 2-FF  RLE 4/5 HF, KE, 2- ankle DF  LLE 4/5 HF KE, ADF   Tr to 1/4 tone in LE's. Heel cords generally just tight Clonus Bilateral ankles ---decreased in intensity Intact PP/LT in all 4's.  Fair insight and awareness--better focus,  Skin: bruise around right eye, a few other scattered  bruises on the extremities.  Musc: left wrist/hand splint in place, tight wrist  Psych: patient pleasant and cooperative    Assessment/Plan: 1. 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 Physiatrist is providing close team supervision and 24 hour management of active medical problems listed below. Physiatrist and rehab team continue to assess barriers to discharge/monitor patient progress toward functional and medical goals.     FIM: FIM - Bathing Bathing Steps Patient Completed: Chest;Right Arm;Left Arm;Abdomen;Front perineal area;Right upper leg;Right lower leg (including foot);Left upper leg;Left lower leg (including foot) Bathing: 4: Min-Patient completes 8-9 42f 10 parts or 75+ percent  FIM - Upper Body Dressing/Undressing Upper body dressing/undressing steps patient completed: Thread/unthread right sleeve of pullover shirt/dresss;Thread/unthread left sleeve of pullover shirt/dress Upper body dressing/undressing: 3: Mod-Patient completed 50-74% of tasks FIM - Lower Body Dressing/Undressing Lower body dressing/undressing steps patient completed: Thread/unthread left underwear leg;Thread/unthread left pants leg Lower body dressing/undressing: 1: Total-Patient completed less than 25% of tasks  FIM - Toileting Toileting steps completed by patient: Adjust clothing prior to toileting;Performs perineal hygiene Toileting Assistive Devices: Grab bar or rail for support Toileting: 1: Total-Patient completed zero steps, helper did all 3  FIM - Diplomatic Services operational officer Devices: Therapist, music Transfers: 4-To  toilet/BSC: Min A (steadying Pt. > 75%)  FIM - Banker Devices: Walker;Arm rests Bed/Chair Transfer: 6: Supine > Sit: No assist;6: Sit > Supine: No assist;4: Bed > Chair or W/C: Min A  (steadying Pt. > 75%);4: Chair or W/C > Bed: Min A (steadying Pt. > 75%)  FIM - Locomotion: Wheelchair Distance: 120 Locomotion: Wheelchair: 2: Travels 50 - 149 ft with supervision, cueing or coaxing FIM - Locomotion: Ambulation Locomotion: Ambulation Assistive Devices: Walker - Rolling;Orthosis (L carbon fiber AFO) Ambulation/Gait Assistance: 4: Min assist Locomotion: Ambulation: 0: Activity did not occur  Comprehension Comprehension Mode: Auditory Comprehension: 5-Understands complex 90% of the time/Cues < 10% of the time  Expression Expression Mode: Verbal Expression: 6-Expresses complex ideas: With extra time/assistive device  Social Interaction Social Interaction: 6-Interacts appropriately with others with medication or extra time (anti-anxiety, antidepressant).  Problem Solving Problem Solving: 5-Solves basic problems: With no assist  Memory Memory: 5-Recognizes or recalls 90% of the time/requires cueing < 10% of the time  Medical Problem List and Plan:  1. 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 Additional deconditioning after medical issues above  2. DVT Prophylaxis/Anticoagulation: Lovenox 40 mg daily. Monitor platelet counts and any signs of  3. Pain Management/chronic pain syndrome: Lyrica 150 mg 2 times a day---decrease to , Methadone 5 mg every 8 hours as needed (home dose), oxycodone 10 mg every 4 hours as needed, Valium 5 mg every 12 hours as needed   -continue lidoderm patch for back  -k pad  -continue decadron  -xrays of c and t spine without acute findings  -continue low dose methadone   - baclofen  tid for ext spasms and clonus has really helped 4. Mood/depression: Abilify 10 mg daily, Prozac 20 mg daily, Ativan 2 mg 3 times a day as needed. Provide emotional support   -Palliative Care consult made. Apparently will speak with son on Monday (per pt) 5. Neuropsych: This patient is  capable of making decisions on her own behalf.  6. Skin/Wound Care: Routine skin care. Patient multiple bruises from falls  7. History of ileus with nonspecific abdominal pain. Continue Bentyl 10 mg 3 times a day as well as nexium  8. Left wrist fracture April 2015. Continue splint conservative care/weightbearing as tolerated  9. Nausea/GERD---nexium bid  -better  LOS (Days) 10 A FACE TO FACE EVALUATION WAS PERFORMED  Katherine Potts T 10/31/2013 8:39 AM

## 2013-11-01 ENCOUNTER — Inpatient Hospital Stay (HOSPITAL_COMMUNITY): Payer: Medicare Other

## 2013-11-01 DIAGNOSIS — Z5189 Encounter for other specified aftercare: Secondary | ICD-10-CM

## 2013-11-01 DIAGNOSIS — F3289 Other specified depressive episodes: Secondary | ICD-10-CM

## 2013-11-01 DIAGNOSIS — F329 Major depressive disorder, single episode, unspecified: Secondary | ICD-10-CM

## 2013-11-01 DIAGNOSIS — G1221 Amyotrophic lateral sclerosis: Secondary | ICD-10-CM

## 2013-11-01 DIAGNOSIS — R5381 Other malaise: Secondary | ICD-10-CM

## 2013-11-01 DIAGNOSIS — K219 Gastro-esophageal reflux disease without esophagitis: Secondary | ICD-10-CM

## 2013-11-01 LAB — COMPREHENSIVE METABOLIC PANEL
ALBUMIN: 3.5 g/dL (ref 3.5–5.2)
ALT: 16 U/L (ref 0–35)
AST: 17 U/L (ref 0–37)
Alkaline Phosphatase: 52 U/L (ref 39–117)
Anion gap: 10 (ref 5–15)
BUN: 31 mg/dL — ABNORMAL HIGH (ref 6–23)
CHLORIDE: 103 meq/L (ref 96–112)
CO2: 27 meq/L (ref 19–32)
CREATININE: 1.19 mg/dL — AB (ref 0.50–1.10)
Calcium: 9.7 mg/dL (ref 8.4–10.5)
GFR calc Af Amer: 60 mL/min — ABNORMAL LOW (ref >90)
GFR, EST NON AFRICAN AMERICAN: 52 mL/min — AB (ref >90)
Glucose, Bld: 83 mg/dL (ref 70–99)
Potassium: 4.2 mEq/L (ref 3.7–5.3)
SODIUM: 140 meq/L (ref 137–147)
Total Protein: 6.6 g/dL (ref 6.0–8.3)

## 2013-11-01 LAB — CBC
HEMATOCRIT: 37 % (ref 36.0–46.0)
Hemoglobin: 12.2 g/dL (ref 12.0–15.0)
MCH: 31.4 pg (ref 26.0–34.0)
MCHC: 33 g/dL (ref 30.0–36.0)
MCV: 95.1 fL (ref 78.0–100.0)
Platelets: 176 10*3/uL (ref 150–400)
RBC: 3.89 MIL/uL (ref 3.87–5.11)
RDW: 13.1 % (ref 11.5–15.5)
WBC: 8.6 10*3/uL (ref 4.0–10.5)

## 2013-11-01 LAB — URINALYSIS, ROUTINE W REFLEX MICROSCOPIC
BILIRUBIN URINE: NEGATIVE
Glucose, UA: NEGATIVE mg/dL
HGB URINE DIPSTICK: NEGATIVE
KETONES UR: NEGATIVE mg/dL
Leukocytes, UA: NEGATIVE
Nitrite: NEGATIVE
Protein, ur: NEGATIVE mg/dL
Specific Gravity, Urine: 1.006 (ref 1.005–1.030)
Urobilinogen, UA: 0.2 mg/dL (ref 0.0–1.0)
pH: 6 (ref 5.0–8.0)

## 2013-11-01 MED ORDER — METHADONE HCL 5 MG PO TABS
5.0000 mg | ORAL_TABLET | Freq: Two times a day (BID) | ORAL | Status: DC
  Administered 2013-11-02 – 2013-11-03 (×3): 5 mg via ORAL
  Filled 2013-11-01 (×3): qty 1

## 2013-11-01 MED ORDER — SODIUM CHLORIDE 0.45 % IV SOLN
INTRAVENOUS | Status: DC
  Administered 2013-11-01 – 2013-11-02 (×2): via INTRAVENOUS

## 2013-11-01 NOTE — Progress Notes (Signed)
Physical Therapy Session Note  Patient Details  Name: Katherine Potts MRN: [id number] Date of Birth: 08-25-1960  Today's Date: 11/01/2013 PT Individual Time: 1030-1050 PT Individual Time Calculation (min): 20 min   Short Term Goals: Week 2:  PT Short Term Goal 1 (Week 2): STG=LTGs  Skilled Therapeutic Interventions/Progress Updates:    Pt received seated in w/c, requesting loudly that therapist wheel her in hallway. Noted significantly increased confusion, agitation since last time this therapist treated her (Wednesday 9/2). Pt complaining of significant back pain. Pt w/ sit<>stand w/ ModA w/ RW for therapist to pull up pants. Pt then requested to be transported to back to room in order to lay down. While wheeling past nurse's station, pt strongly requested to sit at nurse's station instead. Pt given backrub while seated in w/c for pain control. Pt requested that therapy end early. Pt left seated at nurse's station w/ QRB on. Pt missed 10 minutes of scheduled PT  Therapy Documentation Precautions:  Precautions Precautions: Fall Precaution Comments: Recent increase in numbers of falls Required Braces or Orthoses: Other Brace/Splint Other Brace/Splint: Left wrist cock-up splint while up; resting hand splint @ night to prevent contractures Restrictions Weight Bearing Restrictions: No General:   Vital Signs: Therapy Vitals Temp: 98.2 F (36.8 C) Temp src: Oral Pulse Rate: 78 Resp: 18 BP: 118/84 mmHg Patient Position (if appropriate): Lying Oxygen Therapy SpO2: 100 % O2 Device: None (Room air) Pain:   Mobility:   Locomotion :    Trunk/Postural Assessment :    Balance:   Exercises:   Other Treatments:    See FIM for current functional status  Therapy/Group: Individual Therapy  Hosie Spangle Hosie Spangle, PT, DPT 11/01/2013, 7:40 AM

## 2013-11-01 NOTE — Progress Notes (Signed)
PHYSICAL MEDICINE & REHABILITATION     PROGRESS NOTE    Subjective/Complaints: Pt up most of night, confused. Still confused, disoriented this morning---this is new---I was not made aware and discovered upon rounds this morning.  Pain remains controlled.   Objective: Vital Signs: Blood pressure 118/84, pulse 78, temperature 98.2 F (36.8 C), temperature source Oral, resp. rate 18, height  (1.626 m), weight 65.772 kg (145 lb), SpO2 100.00%. No results found.  Recent Labs  11/01/13 0730  WBC 8.6  HGB 12.2  HCT 37.0  PLT 176   No results found for this basename: NA, K, CL, CO, GLUCOSE, BUN, CREATININE, CALCIUM,  in the last 72 hours CBG (last 3)  No results found for this basename: GLUCAP,  in the last 72 hours  Wt Readings from Last 3 Encounters:  10/28/13 65.772 kg (145 lb)  10/20/13 63.322 kg (139 lb 9.6 oz)  07/09/13 65.772 kg (145 lb)    Physical Exam:  Gen: confused, discombobulated  HENT: oral mucosa pink/moist  Head: Normocephalic.  Eyes: EOM are normal.  Neck: Normal range of motion. Neck supple. No thyromegaly present.  Cardiovascular: Normal rate and regular rhythm.  Respiratory: Effort normal and breath sounds normal. No respiratory distress.  GI: Soft. Bowel sounds are normal. She exhibits no distension.  Neurological:  Reflex Scores:  Tricep reflexes are 3+ on the right side and 3+ on the left side.  Bicep reflexes are 3+ on the right side and 3+ on the left side.  Brachioradialis reflexes are 3+ on the right side and 3+ on the left side.  Patellar reflexes are 3+ on the right side and 3+ on the left side.  Achilles reflexes are 4+ on the right side and 4+ on the left side.  RUE 5/5 in deltoid Bi, tri grip is 3 to 3+  LUE 3- delt, 4- Bi, tri, 1/5 L WE, FE, 2-FF  RLE 4/5 HF, KE, 2- ankle DF  LLE 4/5 HF KE, ADF   Tr to 1/4 tone in LE's. Heel cords generally just tight Clonus Bilateral ankles ---decreased in intensity Intact PP/LT in all  4's.  Language of confusion. Drooling out of left side of mouth    Skin: bruise around right eye, a few other scattered bruises on the extremities.  Musc: left wrist/hand splint in place, tight wrist  Psych: confused   Assessment/Plan: 1. 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 Physiatrist is providing close team supervision and 24 hour management of active medical problems listed below. Physiatrist and rehab team continue to assess barriers to discharge/monitor patient progress toward functional and medical goals.     FIM: FIM - Bathing Bathing Steps Patient Completed: Chest;Right Arm;Left Arm;Abdomen;Front perineal area;Right upper leg;Right lower leg (including foot);Left upper leg;Left lower leg (including foot) Bathing: 4: Min-Patient completes 8-9 72f 10 parts or 75+ percent  FIM - Upper Body Dressing/Undressing Upper body dressing/undressing steps patient completed: Thread/unthread right sleeve of pullover shirt/dresss;Thread/unthread left sleeve of pullover shirt/dress Upper body dressing/undressing: 3: Mod-Patient completed 50-74% of tasks FIM - Lower Body Dressing/Undressing Lower body dressing/undressing steps patient completed: Thread/unthread left underwear leg;Thread/unthread left pants leg Lower body dressing/undressing: 1: Total-Patient completed less than 25% of tasks  FIM - Toileting Toileting steps completed by patient: Adjust clothing prior to toileting;Performs perineal hygiene Toileting Assistive Devices: Grab bar or rail for support Toileting: 1: Total-Patient completed  zero steps, helper did all 3  FIM - Diplomatic Services operational officer Devices: Grab bars Toilet Transfers: 4-To toilet/BSC: Min A (steadying Pt. > 75%)  FIM - Banker Devices: Walker;Arm  rests Bed/Chair Transfer: 6: Supine > Sit: No assist;6: Sit > Supine: No assist;4: Bed > Chair or W/C: Min A (steadying Pt. > 75%);4: Chair or W/C > Bed: Min A (steadying Pt. > 75%)  FIM - Locomotion: Wheelchair Distance: 120 Locomotion: Wheelchair: 2: Travels 50 - 149 ft with supervision, cueing or coaxing FIM - Locomotion: Ambulation Locomotion: Ambulation Assistive Devices: Walker - Rolling;Orthosis (L carbon fiber AFO) Ambulation/Gait Assistance: 4: Min assist Locomotion: Ambulation: 0: Activity did not occur  Comprehension Comprehension Mode: Auditory Comprehension: 5-Understands basic 90% of the time/requires cueing < 10% of the time  Expression Expression Mode: Verbal Expression: 6-Expresses complex ideas: With extra time/assistive device  Social Interaction Social Interaction: 6-Interacts appropriately with others with medication or extra time (anti-anxiety, antidepressant).  Problem Solving Problem Solving: 5-Solves basic problems: With no assist  Memory Memory: 5-Recognizes or recalls 90% of the time/requires cueing < 10% of the time  Medical Problem List and Plan:  1. 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 Additional deconditioning after medical issues above  2. DVT Prophylaxis/Anticoagulation: Lovenox 40 mg daily. Monitor platelet counts and any signs of  3. Pain Management/chronic pain syndrome: Lyrica 150 mg 2 times a day---decrease to , Methadone 5 mg every 8 hours as needed (home dose), oxycodone 10 mg every 4 hours as needed, Valium 5 mg every 12 hours as needed   -continue lidoderm patch for back  -k pad  -continue decadron  -xrays of c and t spine without acute findings  -continue low dose methadone (hold)  - baclofen  tid for ext spasms and clonus has really helped--hold for now 4. Mood/depression: Abilify 10 mg daily, Prozac 20 mg daily, Ativan 2 mg 3 times a day as needed.  Provide emotional support   -Palliative Care consult made. Apparently will speak with son on Monday (per pt) 5. Neuropsych: This patient is capable of making decisions on her own behalf.  6. Skin/Wound Care: Routine skin care. Patient multiple bruises from falls  7. History of ileus with nonspecific abdominal pain. Continue Bentyl 10 mg 3 times a day as well as nexium  8. Left wrist fracture April 2015. Continue splint conservative care/weightbearing as tolerated  9. Nausea/GERD---nexium bid  -better 10. AMS--new since last night  -holding methadone and baclofen  -lab work being drawn today, check urine also  -supportive care and observation for now  LOS (Days) 11 A FACE TO FACE EVALUATION WAS PERFORMED  SWARTZ,ZACHARY T 11/01/2013 8:16 AM

## 2013-11-02 ENCOUNTER — Inpatient Hospital Stay (HOSPITAL_COMMUNITY): Payer: Medicare Other | Admitting: Rehabilitation

## 2013-11-02 ENCOUNTER — Inpatient Hospital Stay (HOSPITAL_COMMUNITY): Payer: Medicare Other

## 2013-11-02 DIAGNOSIS — Z515 Encounter for palliative care: Secondary | ICD-10-CM

## 2013-11-02 DIAGNOSIS — R531 Weakness: Secondary | ICD-10-CM

## 2013-11-02 LAB — URINE CULTURE
CULTURE: NO GROWTH
Colony Count: NO GROWTH

## 2013-11-02 MED ORDER — BACLOFEN 5 MG HALF TABLET
5.0000 mg | ORAL_TABLET | Freq: Two times a day (BID) | ORAL | Status: DC
  Administered 2013-11-02 – 2013-11-03 (×3): 5 mg via ORAL
  Filled 2013-11-02 (×5): qty 1

## 2013-11-02 MED ORDER — DIAZEPAM 5 MG PO TABS: 2.5000 mg | ORAL_TABLET | Freq: Two times a day (BID) | ORAL | Status: DC | PRN

## 2013-11-02 NOTE — Consult Note (Signed)
I have reviewed and discussed case with Nurse Practitioner And agree with documentation and plan as noted above   Haillie Radu J. Wladyslawa Disbro D.O.  Palliative Medicine Team at   Team Phone: [phone]    

## 2013-11-02 NOTE — Progress Notes (Signed)
Physical Therapy Session Note  Patient Details  Name: Katherine Potts MRN: [id number] Date of Birth: [birth year]  Today's Date: 11/02/2013 PT Individual Time: 1430-1530 PT Individual Time Calculation (min): 60 min   Short Term Goals: Week 2:  PT Short Term Goal 1 (Week 2): STG=LTGs  Skilled Therapeutic Interventions/Progress Updates:    pt received seated in w/c, agreeable to participate in therapy after using bathroom. Therapist assisted pt w/ SPT w/c>toilet w/ use of grab bars and MinA, max VC's for foot placement. Pt required TotalA to manage clothing before/after toileting. While pt was toileting, pt's son arrived for mobility training and to bring lace up sneakers. Pt transported to hallway outside rehab gym via w/c, therapist fit WalkOn Reaction AFO's into pt's lace up shoes bilaterally. Pt ambulated w/ RW and bilateral AFO's for 25' then 20' w/ standing rest braek in between w/ MinGuard A, mod VC's for RW placement. Noted decreased foot drag w/ use of AFOs. Pt transported to ortho gym, pt and son transferred w/ SPT w/ RW w/c<>car w/ son providing effective and safe MinA. Pt's son negotiated pt's wheelchair up/down ramp in order to simulate pt's home entrance. Pt ambulated 15' w/ RW and bilateral AFOs before reporting cramping in bilateral legs and requesting to sit down. Pt transported back to room via w/c, left seated in w/c w/ son present and all needs within reach.  Therapy Documentation Precautions:  Precautions Precautions: Fall Precaution Comments: Recent increase in numbers of falls Required Braces or Orthoses: Other Brace/Splint Other Brace/Splint: Left wrist cock-up splint while up; resting hand splint @ night to prevent contractures Restrictions Weight Bearing Restrictions: No General:   Vital Signs: Therapy Vitals Temp: 98.2 F (36.8 C) Temp src: Oral Pulse Rate: 76 Resp: 17 BP: 137/85 mmHg Patient Position (if appropriate): Lying Oxygen Therapy SpO2: 99 % O2  Device: None (Room air) Pain:   Mobility:   Locomotion :    Trunk/Postural Assessment :    Balance:   Exercises:   Other Treatments:    See FIM for current functional status  Therapy/Group: Individual Therapy  Hosie Spangle Hosie Spangle, PT, DPT 11/02/2013, 7:51 AM

## 2013-11-02 NOTE — Progress Notes (Signed)
Physical Therapy Session Note  Patient Details  Name: Katherine Potts MRN: [id number] Date of Birth: 06-04-1960  Today's Date: 11/02/2013 PT Individual Time: 1100-1203 PT Individual Time Calculation (min): 63 min   Short Term Goals: Week 2:  PT Short Term Goal 1 (Week 2): STG=LTGs  Skilled Therapeutic Interventions/Progress Updates:   Pt received sitting in w/c in room, having just finished OT session.  Pt with c/o nausea, therefore RN brought meds prior to leaving room.  Assisted pt to/from therapy gym in w/c at total A level.  Skilled session focused on gait in controlled and home environment, bed mobility, functional transfers, car transfers, and gait up/down ramp to simulate home entry.  Performed gait x 30' in controlled/home environment at min A level with min cues for upright posture and positioning inside of RW.  Note did trial L allard AFO, however unable to fit allard in R shoe, so provided ace wrap for DF assist.  Note marked improvement in foot clearance and pt states that it does assist.  Notified PA to write order for braces since tomorrow is D/C day.  Primary PT notified for PM session.  Performed bed mobility at S level in ADL apt to better simulate home.  Performed car transfer with education on w/c set up at S level.  Mod cues for technique as pt was going to attempt to step inside of car rather than turning with RW.  Ended session with gait up ramp to simulate home.  Pt able to ascend at min A level, however due to increased fatigue, was unable to turn and descend, therefore assisted into chair and down ramp.  Pt assisted back to room and note call bell still does not work (nurse aware) therefore left note to call on room phone to nurse or nurse tech and left their numbers.  Pt left with all needs in reach.    Therapy Documentation Precautions:  Precautions Precautions: Fall Precaution Comments: Recent increase in numbers of falls Required Braces or Orthoses: Other  Brace/Splint Other Brace/Splint: Left wrist cock-up splint while up; resting hand splint @ night to prevent contractures Restrictions Weight Bearing Restrictions: No Pain: Pain Assessment Pain Score: 4  Faces Pain Scale: Hurts little more Locomotion : Ambulation Ambulation/Gait Assistance: 4: Min assist   See FIM for current functional status  Therapy/Group: Individual Therapy  Vista Deck 11/02/2013, 12:15 PM

## 2013-11-02 NOTE — Progress Notes (Signed)
Orthopedic Tech Progress Note Patient Details:  Katherine Potts 1960-07-14 [id number] Advanced office closed for the holiday. Order to be called in tomorrow morning.  Patient ID: Katherine Potts, female   DOB: 04-Apr-1960, 53 y.o.   MRN: [id number]   Orie Rout 11/02/2013, 11:33 AM

## 2013-11-02 NOTE — Progress Notes (Signed)
Social Work Patient ID: Katherine Potts, female   DOB: 04-14-1960, 53 y.o.   MRN: [id number]  Pt's son now here and involved in PT session with pt, however, he was a "no show" for the planned ed session with OT this morning.  He apologizes but offers no reason.  Have explained to both that son must complete all education and meet with Palliative Care NP prior to d/c.  Son to stay through the afternoon .  I have conacted Markham Jordan with PC who plans to meet with pt and son this afternoon.  Have scheduled 10am OT session tomorrow and explained to son the importance of him attending this.  Will follow up with pt tomorrow morning to insure all of these meetings have taken place.  PT also working on getting pt an AFO brace prior to d/c.  Zebedee Segundo, LCSW

## 2013-11-02 NOTE — Progress Notes (Signed)
[facility] PHYSICAL MEDICINE & REHABILITATION     PROGRESS NOTE    Subjective/Complaints: Pt up most of night, confused. Still confused, disoriented this morning---this is new---I was not made aware and discovered upon rounds this morning.  Pain remains controlled.   Objective: Vital Signs: Blood pressure 137/85, pulse 76, temperature 98.2 F (36.8 C), temperature source Oral, resp. rate 17, height  (1.626 m), weight 65.772 kg (145 lb), SpO2 99.00%. No results found.  Recent Labs  11/01/13 0730  WBC 8.6  HGB 12.2  HCT 37.0  PLT 176    Recent Labs  11/01/13 0730  NA 140  K 4.2  CL 103  GLUCOSE 83  BUN 31*  CREATININE 1.19*  CALCIUM 9.7   CBG (last 3)  No results found for this basename: GLUCAP,  in the last 72 hours  Wt Readings from Last 3 Encounters:  10/28/13 65.772 kg (145 lb)  10/20/13 63.322 kg (139 lb 9.6 oz)  07/09/13 65.772 kg (145 lb)    Physical Exam:  Gen: confused, discombobulated  HENT: oral mucosa pink/moist  Head: Normocephalic.  Eyes: EOM are normal.  Neck: Normal range of motion. Neck supple. No thyromegaly present.  Cardiovascular: Normal rate and regular rhythm.  Respiratory: Effort normal and breath sounds normal. No respiratory distress.  GI: Soft. Bowel sounds are normal. She exhibits no distension.  Neurological:  Reflex Scores:  Tricep reflexes are 3+ on the right side and 3+ on the left side.  Bicep reflexes are 3+ on the right side and 3+ on the left side.  Brachioradialis reflexes are 3+ on the right side and 3+ on the left side.  Patellar reflexes are 3+ on the right side and 3+ on the left side.  Achilles reflexes are 4+ on the right side and 4+ on the left side.  RUE 5/5 in deltoid Bi, tri grip is 3 to 3+  LUE 3- delt, 4- Bi, tri, 1/5 L WE, FE, 2-FF  RLE 4/5 HF, KE, 2- ankle DF  LLE 4/5 HF KE, ADF   Tr to 1/4 tone in LE's. Heel cords generally just tight Clonus Bilateral ankles ---decreased in intensity Intact PP/LT  in all 4's.  Language of confusion. Drooling out of left side of mouth    Skin: bruise around right eye, a few other scattered bruises on the extremities.  Musc: left wrist/hand splint in place, tight wrist  Psych: confused   Assessment/Plan: 1. 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 Physiatrist is providing close team supervision and 24 hour management of active medical problems listed below. Physiatrist and rehab team continue to assess barriers to discharge/monitor patient progress toward functional and medical goals.     FIM: FIM - Bathing Bathing Steps Patient Completed: Chest;Right Arm;Left Arm;Abdomen;Front perineal area;Right upper leg;Right lower leg (including foot);Left upper leg;Left lower leg (including foot) Bathing: 4: Min-Patient completes 8-9 41f 10 parts or 75+ percent  FIM - Upper Body Dressing/Undressing Upper body dressing/undressing steps patient completed: Thread/unthread right sleeve of pullover shirt/dresss;Thread/unthread left sleeve of pullover shirt/dress Upper body dressing/undressing: 3: Mod-Patient completed 50-74% of tasks FIM - Lower Body Dressing/Undressing Lower body dressing/undressing steps patient completed: Thread/unthread left underwear leg;Thread/unthread left pants leg Lower body dressing/undressing: 1: Total-Patient completed less than 25% of tasks  FIM - Toileting Toileting steps completed by patient: Adjust clothing prior to toileting;Performs perineal hygiene Toileting Assistive Devices: Grab  bar or rail for support Toileting: 1: Total-Patient completed zero steps, helper did all 3  FIM - Diplomatic Services operational officer Devices: Grab bars Toilet Transfers: 4-To toilet/BSC: Min A (steadying Pt. > 75%)  FIM - Banker Devices: Walker;Arm  rests Bed/Chair Transfer: 3: Bed > Chair or W/C: Mod A (lift or lower assist);3: Chair or W/C > Bed: Mod A (lift or lower assist)  FIM - Locomotion: Wheelchair Distance: 120 Locomotion: Wheelchair: 1: Total Assistance/staff pushes wheelchair (Pt<25%) FIM - Locomotion: Ambulation Locomotion: Ambulation Assistive Devices: Walker - Rolling;Orthosis (L carbon fiber AFO) Ambulation/Gait Assistance: 4: Min assist Locomotion: Ambulation: 0: Activity did not occur  Comprehension Comprehension Mode: Auditory Comprehension: 5-Understands basic 90% of the time/requires cueing < 10% of the time  Expression Expression Mode: Verbal Expression: 6-Expresses complex ideas: With extra time/assistive device  Social Interaction Social Interaction: 6-Interacts appropriately with others with medication or extra time (anti-anxiety, antidepressant).  Problem Solving Problem Solving: 5-Solves basic problems: With no assist  Memory Memory: 5-Recognizes or recalls 90% of the time/requires cueing < 10% of the time  Medical Problem List and Plan:  1. 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 Additional deconditioning after medical issues above  2. DVT Prophylaxis/Anticoagulation: Lovenox 40 mg daily. Monitor platelet counts and any signs of  3. Pain Management/chronic pain syndrome: Lyrica 150 mg 2 times a day---decrease to , Methadone 5 mg every 12hr, oxycodone 10 mg every 4 hours as needed, Valium 2.5 mg every 12 hours as needed   -continue lidoderm patch for back  -k pad  -continue decadron  -xrays of c and t spine without acute findings  -continue low dose methadone as above  - baclofen reduced to  bid.  4. Mood/depression: Abilify 10 mg daily, Prozac 20 mg daily, Ativan 2 mg 3 times a day as needed. Provide emotional support   -Palliative Care consult made. Apparently will speak with son on Monday (per pt) 5. Neuropsych: This  patient is capable of making decisions on her own behalf.  6. Skin/Wound Care: Routine skin care. Patient multiple bruises from falls  7. History of ileus with nonspecific abdominal pain. Continue Bentyl 10 mg 3 times a day as well as nexium  8. Left wrist fracture April 2015. Continue splint conservative care/weightbearing as tolerated  9. Nausea/GERD---nexium bid  -better 10. AMS--likely med related---back to baseline today  -methadone reduced to  q12 and baclofen to  q12  -ucx still pending  LOS (Days) 12 A FACE TO FACE EVALUATION WAS PERFORMED  SWARTZ,ZACHARY T 11/02/2013 7:44 AM

## 2013-11-02 NOTE — Progress Notes (Signed)
Occupational Therapy Session Note  Patient Details  Name: Katherine Potts MRN: [id number] Date of Birth: November 24, 1960  Today's Date: 11/02/2013 OT Individual Time: 1000-1100 OT Individual Time Calculation (min): 60 min    Short Term Goals: Week 2:  OT Short Term Goal 1 (Week 2): STG=LTG due to anticiapted discharge within 7 days.  Skilled Therapeutic Interventions/Progress Updates: ADL-retraining with focus on improved sustained attention during tasks, improved FMC of BUE and improved dynamic sitting balance.   Pt received supine in bed asleep but responsive to cues to initiate mobility and perform assisted BADL.   Pt's son was not present as requested for training.   With extra time and copious cues for redirection to complete each task, pt advanced through BADL requiring overall supervision and setup during bathing, min assist with transfers, mod assist with upper body dressing, and max assist with lower body dressing.  Pt demonstrates poor carry-over or recall of her previous conversations/comments while in dialog with OT during assisted BADL and she often repeats the same stories she's shared as if they are new to therapist.   Plan to educate pt's son during final session planned for 11/03/13.     Therapy Documentation Precautions:  Precautions Precautions: Fall Precaution Comments: Recent increase in numbers of falls Required Braces or Orthoses: Other Brace/Splint Other Brace/Splint: Left wrist cock-up splint while up; resting hand splint @ night to prevent contractures Restrictions Weight Bearing Restrictions: No  Vital Signs: Therapy Vitals Temp: 97.5 F (36.4 C) Temp src: Oral Pulse Rate: 77 Resp: 17 BP: 112/81 mmHg Patient Position (if appropriate): Sitting  Pain: 6/10, back (upper)   ADL: ADL ADL Comments: see FIM  See FIM for current functional status  Therapy/Group: Individual Therapy  Anijah Spohr 11/02/2013, 3:24 PM

## 2013-11-02 NOTE — Progress Notes (Signed)
Progress Note from the Palliative Medicine Team at [facility]  Subjective:  -patient is alert and oriented, called to room to meet with her son Casimiro Needle  -continued conversation regarding the natural trajectory and expectations of disease process and the importance of continued conversation regarding GOC and advanced directive decisions  -SW aware of interest in securing HPOA, patient wishes her son Casimiro Needle to be her HPOA     Objective: Allergies  Allergen Reactions  . Bactrim [Sulfamethoxazole-Trimethoprim] Nausea Only  . Ciprofloxacin Nausea And Vomiting   Scheduled Meds: . ARIPiprazole  2 mg Oral Daily  . baclofen  5 mg Oral BID  . dexamethasone  2 mg Oral Daily  . docusate sodium  100 mg Oral BID  . enoxaparin (LOVENOX) injection  40 mg Subcutaneous Q24H  . esomeprazole  40 mg Oral BID  . FLUoxetine  20 mg Oral Daily  . lidocaine  2 patch Transdermal Q24H  . methadone  5 mg Oral Q12H  . pregabalin  50 mg Oral BID  . senna  2 tablet Oral QHS  . sorbitol  30 mL Oral Q2000   Continuous Infusions: . sodium chloride 75 mL/hr at 11/02/13 0402   PRN Meds:.acetaminophen, albuterol, bisacodyl, diazepam, LORazepam, magnesium hydroxide, ondansetron (ZOFRAN) IV, ondansetron, oxyCODONE, sodium phosphate, sorbitol  BP 112/81  Pulse 77  Temp(Src) 97.5 F (36.4 C) (Oral)  Resp 17  Ht  (1.626 m)  Wt 65.772 kg (145 lb)  BMI 24.88 kg/m2  SpO2 99%   PPS:30%  Pain Score:denies presetnly   Intake/Output Summary (Last 24 hours) at 11/02/13 1656 Last data filed at 11/02/13 1200  Gross per 24 hour  Intake    960 ml  Output    400 ml  Net    560 ml        Physical Exam:  General: chronically ill appearing, NAD HEENT:  Moist buccal membranes, no exudate Chest:   CTA CVS: RRR Abdomen:soft NT +BS Ext: LUE in splint, fingers puffy with edema Neuro: oriented X3,  Psych:  with clear insight into her disease, she is able to clearly express her interpretation f "quality of  life" to her son     Labs: CBC    Component Value Date/Time   WBC 8.6 11/01/2013 0730   WBC 8.7 06/24/2013 1240   RBC 3.89 11/01/2013 0730   RBC 4.13 06/24/2013 1240   HGB 12.2 11/01/2013 0730   HCT 37.0 11/01/2013 0730   PLT 176 11/01/2013 0730   MCV 95.1 11/01/2013 0730   MCH 31.4 11/01/2013 0730   MCH 32.9 06/24/2013 1240   MCHC 33.0 11/01/2013 0730   MCHC 34.6 06/24/2013 1240   RDW 13.1 11/01/2013 0730   RDW 13.5 06/24/2013 1240   LYMPHSABS 4.0 10/22/2013 0620   LYMPHSABS 2.0 06/24/2013 1240   MONOABS 0.6 10/22/2013 0620   EOSABS 0.2 10/22/2013 0620   EOSABS 0.1 06/24/2013 1240   BASOSABS 0.0 10/22/2013 0620   BASOSABS 0.0 06/24/2013 1240    BMET    Component Value Date/Time   NA 140 11/01/2013 0730   NA 141 06/24/2013 1240   K 4.2 11/01/2013 0730   CL 103 11/01/2013 0730   CO2 27 11/01/2013 0730   GLUCOSE 83 11/01/2013 0730   GLUCOSE 91 06/24/2013 1240   BUN 31* 11/01/2013 0730   BUN 15 06/24/2013 1240   CREATININE 1.19* 11/01/2013 0730   CALCIUM 9.7 11/01/2013 0730   GFRNONAA 52* 11/01/2013 0730   GFRAA 60* 11/01/2013 0730  CMP     Component Value Date/Time   NA 140 11/01/2013 0730   NA 141 06/24/2013 1240   K 4.2 11/01/2013 0730   CL 103 11/01/2013 0730   CO2 27 11/01/2013 0730   GLUCOSE 83 11/01/2013 0730   GLUCOSE 91 06/24/2013 1240   BUN 31* 11/01/2013 0730   BUN 15 06/24/2013 1240   CREATININE 1.19* 11/01/2013 0730   CALCIUM 9.7 11/01/2013 0730   PROT 6.6 11/01/2013 0730   PROT 6.5 06/24/2013 1240   ALBUMIN 3.5 11/01/2013 0730   AST 17 11/01/2013 0730   ALT 16 11/01/2013 0730   ALKPHOS 52 11/01/2013 0730   BILITOT <0.2* 11/01/2013 0730   GFRNONAA 52* 11/01/2013 0730   GFRAA 60* 11/01/2013 0730    Assessment and Plan: 1. Code Status:  Full code 2. Symptom Control:  Weakness-continue PT/OT, medical management of chronic disease 3. Psycho/Social:  Emotional support offered to both patient and her son.  They is very difficult for both, her son is only 33 yo and taking on full responsibility for his mother and their  household. 4. Spiritual  Strong Catholic faith 5. Disposition:  Home with HH services in place.  Patient Documents Completed or Given: Document Given Completed  Advanced Directives Pkt yes   MOST yes   DNR    Gone from My Sight    Hard Choices      Time In Time Out Total Time Spent with Patient Total Overall Time  1600 1645 45 min 45 min    Greater than 50%  of this time was spent counseling and coordinating care related to the above assessment and plan.  Lorinda Creed NP  Palliative Medicine Team Team Phone # [phone] Pager [phone]   1

## 2013-11-03 ENCOUNTER — Inpatient Hospital Stay (HOSPITAL_COMMUNITY): Payer: Medicare Other | Admitting: Occupational Therapy

## 2013-11-03 MED ORDER — BACLOFEN 5 MG HALF TABLET: 5.0000 mg | ORAL_TABLET | Freq: Two times a day (BID) | ORAL | Status: DC

## 2013-11-03 MED ORDER — LIDOCAINE 5 % EX PTCH: 2.0000 | MEDICATED_PATCH | CUTANEOUS | Status: AC

## 2013-11-03 MED ORDER — DIAZEPAM 5 MG PO TABS: 2.5000 mg | ORAL_TABLET | Freq: Two times a day (BID) | ORAL | Status: DC | PRN

## 2013-11-03 MED ORDER — FLUOXETINE HCL 20 MG PO CAPS: 20.0000 mg | ORAL_CAPSULE | Freq: Every day | ORAL | Status: AC

## 2013-11-03 MED ORDER — OXYCODONE HCL 10 MG PO TABS: 10.0000 mg | ORAL_TABLET | ORAL | Status: DC | PRN

## 2013-11-03 MED ORDER — LORAZEPAM 1 MG PO TABS: 2.0000 mg | ORAL_TABLET | Freq: Three times a day (TID) | ORAL | Status: DC | PRN

## 2013-11-03 MED ORDER — PREGABALIN 50 MG PO CAPS: 50.0000 mg | ORAL_CAPSULE | Freq: Two times a day (BID) | ORAL | Status: DC

## 2013-11-03 MED ORDER — ESOMEPRAZOLE MAGNESIUM 40 MG PO CPDR: 40.0000 mg | DELAYED_RELEASE_CAPSULE | Freq: Two times a day (BID) | ORAL | Status: DC

## 2013-11-03 MED ORDER — METHADONE HCL 5 MG PO TABS: 5.0000 mg | ORAL_TABLET | Freq: Two times a day (BID) | ORAL | Status: DC

## 2013-11-03 MED ORDER — ARIPIPRAZOLE 2 MG PO TABS: 2.0000 mg | ORAL_TABLET | Freq: Every day | ORAL | Status: AC

## 2013-11-03 MED ORDER — DEXAMETHASONE 2 MG PO TABS: 2.0000 mg | ORAL_TABLET | Freq: Every day | ORAL | Status: AC

## 2013-11-03 NOTE — Plan of Care (Signed)
Problem: RH Bed to Chair Transfers Goal: LTG Patient will perform bed/chair transfers w/assist (PT) LTG: Patient will perform bed/chair transfers with assistance, with/without cues (PT).  Outcome: Not Met (add Reason) Pt requires MinGuard A for transfers  Problem: RH Car Transfers Goal: LTG Patient will perform car transfers with assist (PT) LTG: Patient will perform car transfers with assistance (PT).  Outcome: Not Met (add Reason) Pt requires MinGuard A for transfer  Problem: RH Furniture Transfers Goal: LTG Patient will perform furniture transfers w/assist (OT/PT LTG: Patient will perform furniture transfers with assistance (OT/PT).  Outcome: Not Met (add Reason) Pt requires minGuard A for transfer  Problem: RH Ambulation Goal: LTG Patient will ambulate in controlled environment (PT) LTG: Patient will ambulate in a controlled environment, # of feet with assistance (PT).  Outcome: Not Met (add Reason) Pt unable to ambulate 50' due to decreased activity  Problem: RH Wheelchair Mobility Goal: LTG Patient will propel w/c in controlled environment (PT) LTG: Patient will propel wheelchair in controlled environment, # of feet with assist (PT)  Outcome: Not Met (add Reason) Pt unable to propel w/c due to decreased BUE impairment Goal: LTG Patient will propel w/c in home environment (PT) LTG: Patient will propel wheelchair in home environment, # of feet with assistance (PT).  Outcome: Not Met (add Reason) Pt unable to propel w/c due to decreased BUE strength Goal: LTG Patient will propel w/c in community environment (PT) LTG: Patient will propel wheelchair in community environment, # of feet with assist (PT)  Outcome: Not Met (add Reason) Pt unable to propel w/c due to decreased BUE strength  Problem: RH Memory Goal: LTG Patient demonstrate ability for day to day recall (PT) LTG: Patient will demonstrate ability for day to day recall/carryover during mobility activities with assist  (PT)  Outcome: Not Met (add Reason) Pt required mod cueing for day to day recall

## 2013-11-03 NOTE — Discharge Summary (Signed)
Discharge summary job [phone]

## 2013-11-03 NOTE — Discharge Instructions (Signed)
Inpatient Rehab Discharge Instructions  [name] Discharge date and time: No discharge date for patient encounter.   Activities/Precautions/ Functional Status: Activity: activity as tolerated Diet: regular diet Wound Care: none needed Functional status:  ___ No restrictions     ___ Walk up steps independently _x__ 24/7 supervision/assistance   ___ Walk up steps with assistance ___ Intermittent supervision/assistance  ___ Bathe/dress independently ___ Walk with walker     ___ Bathe/dress with assistance ___ Walk Independently    ___ Shower independently _x__ Walk with assistance    ___ Shower with assistance ___ No alcohol     ___ Return to work/school ________     COMMUNITY REFERRALS UPON DISCHARGE:    Home Health:   PT    OT     RN    CNA (bath aide)    SW                   Agency: [facility] Phone: [phone]   Other: Palliative Care support via [facility] @ [phone]  GENERAL COMMUNITY RESOURCES FOR PATIENT/FAMILY:  Support Groups: Available via local ALS chapter and through [facility]    Caregiver Support: same as above    Special Instructions:    My questions have been answered and I understand these instructions. I will adhere to these goals and the provided educational materials after my discharge from the hospital.  Patient/Caregiver Signature _______________________________ Date __________  Clinician Signature _______________________________________ Date __________  Please bring this form and your medication list with you to all your follow-up doctor's appointments.

## 2013-11-03 NOTE — Progress Notes (Signed)
All discharge teaching done by Harvel Ricks PA. Patient denied any questions. Patient son in room during discharge teaching. IV removed from Rt forearm. Patient medicated for pain this morning about 0800. Patient discharged at 1141 this morning with all discharge instructions and belongings. Vitals stable. Patient discharged to home with son.

## 2013-11-03 NOTE — Plan of Care (Signed)
Problem: RH BLADDER ELIMINATION Goal: RH STG MANAGE BLADDER WITH ASSISTANCE STG Manage Bladder With Mod I Assistance  Outcome: Not Met (add Reason) Patient requires min assist

## 2013-11-03 NOTE — Progress Notes (Signed)
Occupational Therapy Session Note  Patient Details  Name: Katherine Potts MRN: [id number] Date of Birth: 02/19/1961  Today's Date: 11/03/2013 OT Individual Time: 1000-1030 OT Individual Time Calculation (min): 30 min   Short Term Goals: Week 2:  OT Short Term Goal 1 (Week 2): STG=LTG due to anticiapted discharge within 7 days.  Skilled Therapeutic Interventions/Progress Updates:  Patient resting in bed upon arrival with RN from another unit who came to visit and her son who had just arrived for caregiver education.  Focus of this session was to be certain that patient's son able to safely care for patient and allow patient to perform some of the tasks she has learned in therapy.  Patient allowing son to perform all tasks and son performing more of the self care session that needed.  Provided vcs to son to be certain that patient's feet are flat on the floor prior to stand.  Son was assisting patient with sit><stand in an awkward position therefore demonstrated a method to assist patient and to protect his back.  Son then return demonstrated this method yet recommend that he practice more when they go home.  Both patient and son stated that this method felt safer.  Patient and son report ready to go home and no questions thor this clinician except son wanted to know how long someone would be in the house for home health.  Reviewed the anticipated expectation and that the SW and [facility] staff can answer that question more effectively.  Therapy Documentation Precautions:  Precautions Precautions: Fall Precaution Comments: Recent increase in numbers of falls Required Braces or Orthoses: Other Brace/Splint Other Brace/Splint: Left wrist cock-up splint while up; resting hand splint @ night to prevent contractures Restrictions Weight Bearing Restrictions: No Pain: No indication of pain ADL: See FIM for current functional status  Therapy/Group: Individual Therapy  Kingston Guiles 11/03/2013,  7:50 AM

## 2013-11-03 NOTE — Progress Notes (Signed)
[place] PHYSICAL MEDICINE & REHABILITATION     PROGRESS NOTE    Subjective/Complaints: Had a good day except for a little nausea last night. Excited to go home today!!! Pain remains controlled.   Objective: Vital Signs: Blood pressure 113/74, pulse 75, temperature 98 F (36.7 C), temperature source Oral, resp. rate 16, height  (1.626 m), weight 65.772 kg (145 lb), SpO2 98.00%. No results found.  Recent Labs  11/01/13 0730  WBC 8.6  HGB 12.2  HCT 37.0  PLT 176    Recent Labs  11/01/13 0730  NA 140  K 4.2  CL 103  GLUCOSE 83  BUN 31*  CREATININE 1.19*  CALCIUM 9.7   CBG (last 3)  No results found for this basename: GLUCAP,  in the last 72 hours  Wt Readings from Last 3 Encounters:  10/28/13 65.772 kg (145 lb)  10/20/13 63.322 kg (139 lb 9.6 oz)  07/09/13 65.772 kg (145 lb)    Physical Exam:  Gen: alert,appropriate  HENT: oral mucosa pink/moist  Head: Normocephalic.  Eyes: EOM are normal.  Neck: Normal range of motion. Neck supple. No thyromegaly present.  Cardiovascular: Normal rate and regular rhythm.  Respiratory: Effort normal and breath sounds normal. No respiratory distress.  GI: Soft. Bowel sounds are normal. She exhibits no distension.  Neurological:  Reflex Scores:  Tricep reflexes are 3+ on the right side and 3+ on the left side.  Bicep reflexes are 3+ on the right side and 3+ on the left side.  Brachioradialis reflexes are 3+ on the right side and 3+ on the left side.  Patellar reflexes are 3+ on the right side and 3+ on the left side.  Achilles reflexes are 4+ on the right side and 4+ on the left side.  RUE 5/5 in deltoid Bi, tri grip is 3 to 3+  LUE 3- delt, 4- Bi, tri, 1/5 L WE, FE, 2-FF  RLE 4/5 HF, KE, 2- ankle DF  LLE 4/5 HF KE, ADF   Tr to 1/4 tone in LE's. Heel cords generally just tight Clonus Bilateral ankles ---decreased in intensity Intact PP/LT in all 4's.  Mentation/memory back to baseline    Skin: bruise around right  eye nearly resolved, .  Musc: non tender. Left wrist still tight Psych: pleasant cooperative   Assessment/Plan: 1. 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 Physiatrist is providing close team supervision and 24 hour management of active medical problems listed below. Physiatrist and rehab team continue to assess barriers to discharge/monitor patient progress toward functional and medical goals.  Dc home today    FIM: FIM - Bathing Bathing Steps Patient Completed: Chest;Right Arm;Left Arm;Abdomen;Front perineal area;Right upper leg;Left upper leg;Right lower leg (including foot);Left lower leg (including foot) Bathing: 4: Min-Patient completes 8-9 10f 10 parts or 75+ percent  FIM - Upper Body Dressing/Undressing Upper body dressing/undressing steps patient completed: Thread/unthread right sleeve of pullover shirt/dresss;Thread/unthread left sleeve of pullover shirt/dress Upper body dressing/undressing: 3: Mod-Patient completed 50-74% of tasks FIM - Lower Body Dressing/Undressing Lower body dressing/undressing steps patient completed: Thread/unthread left underwear leg;Thread/unthread left pants leg Lower body dressing/undressing: 1: Total-Patient completed less than 25% of tasks  FIM - Toileting Toileting steps completed by patient: Adjust clothing after toileting;Performs perineal hygiene Toileting Assistive Devices: Grab bar or rail for support Toileting: 6: Assistive device: No helper  FIM - Diplomatic Services operational officer  Devices: Therapist, music Transfers: 6-More than reasonable amt of time  FIM - Banker Devices: Arm rests;Walker Bed/Chair Transfer: 5: Supine > Sit: Supervision (verbal cues/safety issues);5: Sit > Supine: Supervision (verbal cues/safety issues);5: Bed > Chair or W/C:  Supervision (verbal cues/safety issues);5: Chair or W/C > Bed: Supervision (verbal cues/safety issues)  FIM - Locomotion: Wheelchair Distance: 120 Locomotion: Wheelchair: 1: Total Assistance/staff pushes wheelchair (Pt<25%) FIM - Locomotion: Ambulation Locomotion: Ambulation Assistive Devices: Walker - Rolling;Orthosis Ambulation/Gait Assistance: 4: Min assist Locomotion: Ambulation: 1: Travels less than 50 ft with minimal assistance (Pt.>75%)  Comprehension Comprehension Mode: Auditory Comprehension: 5-Understands basic 90% of the time/requires cueing < 10% of the time  Expression Expression Mode: Verbal Expression: 6-Expresses complex ideas: With extra time/assistive device  Social Interaction Social Interaction: 6-Interacts appropriately with others with medication or extra time (anti-anxiety, antidepressant).  Problem Solving Problem Solving: 5-Solves basic problems: With no assist  Memory Memory: 5-Recognizes or recalls 90% of the time/requires cueing < 10% of the time  Medical Problem List and Plan:  1. 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 Additional deconditioning after medical issues above  2. DVT Prophylaxis/Anticoagulation: Lovenox 40 mg daily. Monitor platelet counts and any signs of  3. Pain Management/chronic pain syndrome: Lyrica 150 mg 2 times a day---decrease to , Methadone 5 mg every 12hr, oxycodone 10 mg every 4 hours as needed, Valium 2.5 mg every 12 hours as needed   -continue lidoderm patch for back  -k pad  -continue decadron  -xrays of c and t spine without acute findings  -continue low dose methadone as above  - baclofen reduced to  bid.  4. Mood/depression: Abilify 10 mg daily, Prozac 20 mg daily, Ativan 2 mg 3 times a day as needed. Provide emotional support   -Palliative Care consult made. Apparently will speak with son on Monday (per pt) 5. Neuropsych: This patient is  capable of making decisions on her own behalf.  6. Skin/Wound Care: Routine skin care. Patient multiple bruises from falls  7. History of ileus with nonspecific abdominal pain. Continue Bentyl 10 mg 3 times a day as well as nexium  8. Left wrist fracture April 2015. Continue splint conservative care/weightbearing as tolerated  9. Nausea/GERD---nexium bid  -better 10. AMS--likely med related---back to baseline today  -methadone reduced to  q12 and baclofen to  q12---would continue these as outpt  -ucx neg  LOS (Days) 13 A FACE TO FACE EVALUATION WAS PERFORMED  SWARTZ,ZACHARY T 11/03/2013 7:29 AM

## 2013-11-03 NOTE — Discharge Summary (Signed)
[name], Katherine Potts            ACCOUNT NO.:  [id number]  MEDICAL RECORD NO.:  [id number]  LOCATION:  4W06C                        FACILITY:  [facility]  PHYSICIAN:  Mariam Dollar, P.A.  DATE OF BIRTH:  28-Jun-1960  DATE OF ADMISSION:  10/21/2013 DATE OF DISCHARGE:  11/03/2013                              DISCHARGE SUMMARY   DISCHARGE DIAGNOSES: 1. Functional deficits secondary to ALS. 2. Subcutaneous Lovenox for DVT prophylaxis. 3. Chronic pain management. 4. Depression. 5. History of ileus. 6. Left wrist fracture in April 2015. 7. Nausea with GERD.  HISTORY OF PRESENT ILLNESS:  This is a 53 year old right-handed female with history of left rib wrist fracture in April 2015; chronic pain syndrome, maintained on methadone as well as ALS followed by Neurology Services, Dr. Anne Hahn, diagnosed in 2014.  The patient lives with her 59- year-old son and assistance as needed.  She used a walker prior to admission.  Admitted on October 19, 2013, with abdominal pain with nausea, diarrhea, vomiting as well as generalized weakness and falls. The patient with progressive decline over the last few months.  CT of abdomen and pelvis, negative.  Subcutaneous Lovenox for DVT prophylaxis. Diet slowly advanced to a regular consistency.  Patient ultimately became severely decondition with limited mobility and was admitted for comprehensive rehab program.  PAST MEDICAL HISTORY:  See discharge diagnoses.  SOCIAL HISTORY:  Lives with 4 year old son and assistance as needed, used a walker prior to admission.  Functional status upon admission to rehab services minimal assist ambulate 65 feet with the rolling walker, minimal assist for general transfers, min to mod assist activities of daily living.  PHYSICAL EXAMINATION:  VITAL SIGNS:  Blood pressure 148/77, pulse 57, temperature 99, respirations 16. GENERAL:  This was an alert female, oriented x3. EYES:  Pupils round and reactive to light. LUNGS:   Clear to auscultation. CARDIAC:  Regular rate and rhythm. ABDOMEN:  Soft, nontender.  Good bowel sounds.  REHABILITATION HOSPITAL COURSE:  Patient was admitted to inpatient rehab services with therapies initiated on a 3-hour daily basis consisting of physical therapy, occupational therapy, and rehabilitation nursing.  The following issues were addressed during the patient's rehabilitation stay.  Pertaining to Ms. Katherine Potts, rehabilitation hospital course related to ALS remained stable.  She would follow up Neurology Services, Dr. Lesia Sago.  She remained on subcutaneous Lovenox for DVT prophylaxis.  Chronic pain management with the use of baclofen 5 mg twice daily, methadone 5 mg every 12 hours, Lyrica 50 mg b.i.d., oxycodone for breakthrough pain, Valium as needed, a Lidoderm patch had been added to her pain managed with good results.  She continued on low- dose Decadron.  X-rays of cervical and thoracic spine without acute fractures.  She would follow up with her ongoing PCP for ongoing pain management.  She remained on Prozac as well as Abilify for history of depression, emotional support provided.  There was talks of palliative care being involved just for lessen burden of care.  She did have a history of ileus.  She had some nonspecific nausea, but overall doing well.  Bowel program well established.  She remained on Nexium twice daily.  The patient with some altered mental status, felt to be  possibly related to her methadone which was decreased to 5 mg every 12 hours as well as a decrease in her baclofen and monitored.  The patient received weekly collaborative interdisciplinary team conferences to discuss estimated length of stay, family teaching, any barriers to discharge. Her overall functional mobility varied.  Ambulated 30 feet in a controlled environment with minimal assistance with some cues.  She was wearing a left AFO brace.  Performed bed mobility at supervision  level and the activities of daily living apartment to better simulate her home.  Occupational Therapy focused on improved sustained attention during tasks.  Patient required overall supervision for set up during bathing minimal assist, transfers moderate assist with upper body dressing, and max assist lower body dressing.  Ongoing education was made with her son who needed some ongoing contact to maintain his sessions with his mother for ongoing education prior to her discharge to home with recommendations of home health physical and occupational therapy.  DISCHARGE MEDICATIONS:  Abilify 2 mg p.o. daily, baclofen 5 mg p.o. b.i.d., Decadron 2 mg p.o. daily, Valium 2.5 mg p.o. every 8 hours as needed muscle spasms, Colace 100 mg p.o. b.i.d., Nexium 40 mg p.o. b.i.d., Prozac 20 mg p.o. daily, Lidoderm patch 2 patches every 12 hours, Ativan 2 mg p.o. t.i.d. as needed anxiety, methadone 5 mg p.o. every 12 hours, oxycodone immediate release 10 mg p.o. every 4 hours as needed moderate pain dispense of #60 tablets, Lyrica 50 mg p.o. b.i.d.  DIET:  Regular.  SPECIAL INSTRUCTIONS:  The patient should follow up Dr. Lesia Sago, Neurology Services ongoing for her history of ALS; Dr. Faith Rogue at the outpatient rehab service office as needed; Dr. Ihor Dow for ongoing medical pain management.  Home health physical and occupational therapy had been arranged.     Mariam Dollar, P.A.     DA/MEDQ  D:  11/03/2013  T:  11/03/2013  Job:  [id number]  cc:   Ranelle Oyster, Dr. Gretel Acre, MD Stephanie Acre, Dr.

## 2013-11-03 NOTE — Progress Notes (Signed)
Physical Therapy Discharge Summary  Patient Details  Name: Katherine Potts MRN: [id number] Date of Birth: Jul 17, 1960  Today's Date: 11/03/2013 PT Individual Time:  -       Patient has met 4 of 12 long term goals due to ability to compensate for deficits.  Patient to discharge at a wheelchair level [place].   Patient's care partner is independent to provide the necessary physical assistance at discharge.  Reasons goals not met: Decreased activity tolerance, decreased strength in BUE/BLE, cognitive deficits, pain  Recommendation:  Patient will benefit from ongoing skilled PT services in home health setting to continue to advance safe functional mobility, address ongoing impairments in pain, strength, functional mobility, and minimize fall risk.  Equipment: Bilateral AFOs  Reasons for discharge: discharge from hospital  Patient/family agrees with progress made and goals achieved: Yes  PT Discharge Precautions/Restrictions Precautions Precautions: Fall Required Braces or Orthoses: Other Brace/Splint Other Brace/Splint: Left wrist cock-up splint while up; resting hand splint @ night to prevent contractures Restrictions Weight Bearing Restrictions: No Vital Signs   Pain   Vision/Perception     Cognition Overall Cognitive Status: Impaired/Different from baseline Arousal/Alertness: Suspect due to medications Sustained Attention: Impaired Selective Attention: Impaired Alternating Attention: Impaired Memory: Impaired Memory Impairment: Decreased recall of new information Sensation Sensation Light Touch: Appears Intact Stereognosis: Appears Intact Hot/Cold: Appears Intact Proprioception: Appears Intact Motor  Motor Motor: Abnormal postural alignment and control;Abnormal tone Motor - Skilled Clinical Observations: increased clonus in bilateral plantarflexors, increased tone in Bilateral quads  Mobility Bed Mobility Bed Mobility:  (Pt overall MinA for bed mobility 2/2  decreased BLE strength) Locomotion  Ambulation Ambulation/Gait Assistance: 4: Min assist Ambulation Distance (Feet): 35 Feet Assistive device: Rolling walker Ambulation/Gait Assistance Details: Pt ambulated w/ bilateral AFOs w/ decreased step length, decreased gait speed  Trunk/Postural Assessment  Cervical Assessment Cervical Assessment: Within Functional Limits Thoracic Assessment Thoracic Assessment: Within Functional Limits Lumbar Assessment Lumbar Assessment: Within Functional Limits Postural Control Postural Control: Deficits on evaluation Righting Reactions: delayed Protective Responses: delayed  Balance Static Sitting Balance Static Sitting - Balance Support: Bilateral upper extremity supported;No upper extremity supported;Feet supported Static Sitting - Level of Assistance: 5: Stand by assistance Static Standing Balance Static Standing - Balance Support: Bilateral upper extremity supported Static Standing - Level of Assistance: 4: Min assist Dynamic Standing Balance Dynamic Standing - Balance Support: Bilateral upper extremity supported Dynamic Standing - Level of Assistance: 4: Min assist Extremity Assessment      RLE Assessment RLE Assessment: Exceptions to [facility] RLE Strength RLE Overall Strength: Deficits RLE Overall Strength Comments: Functionally 3/5 RLE Tone RLE Tone: Hypertonic Hypertonic Details: moderate increased tone in quadriceps RLE Tone Comments: clonus present in plantarflexors LLE Strength LLE Overall Strength: Deficits LLE Overall Strength Comments: Functionally 3/5 LLE Tone LLE Tone: Moderate;Hypertonic Hypertonic Details: moderate increased tone in quadriceps LLE Tone Comments: clonus present in plantarflexors  See FIM for current functional status  Rada Hay Rada Hay, PT, DPT 11/03/2013, 4:48 PM

## 2013-11-03 NOTE — Progress Notes (Signed)
Social Work  Discharge Note  The overall goal for the admission was met for:   Discharge location: Yes - home with son to provide 24/7 assistance  Length of Stay: Yes - 13 days  Discharge activity level: Yes - supervision to light min assist  Home/community participation: Yes  Services provided included: MD, RD, PT, OT, RN, TR, Pharmacy, [employer]: Medicare and Private Insurance: [place]  Follow-up services arranged: Home Health: Therapist, sports, PT, OT, SW, bath aide all via [place], Other: referred to [facility] ([facility]ospice) who will follow post d/c. and Patient/Family request agency HH: [facility], DME: NA  Comments (or additional information):  Patient/Family verbalized understanding of follow-up arrangements: Yes  Individual responsible for coordination of the follow-up plan: patient  Confirmed correct DME delivered: NA    Katherine Potts

## 2013-11-05 NOTE — Progress Notes (Signed)
Occupational Therapy Discharge Summary  Patient Details  Name: Katherine Potts MRN: [id number] Date of Birth: 01/21/1961  Patient has met 10 of 10 long term goals due to improved balance, ability to compensate for deficits and improved awareness.  Patient to discharge at overall Mod Assist level.  Patient's care partner is independent to provide the necessary physical and cognitive assistance at discharge to assist patient with dressing tasks and supervision during functional mobility and transfers.   Pt retrains adaptive eating utensils, braces, and bathroom DME; she plans to have additional grab bars installed in her shower.  Reasons goals not met: n/a, goals downgraded during admission due to decline in BUE coordination and dexterity.  Recommendation:  Patient will benefit from ongoing skilled OT services in home health setting to continue to advance functional skills in the area of BADL.  Equipment: No equipment provided  Reasons for discharge: discharge from hospital  Patient/family agrees with progress made and goals achieved: Yes  OT Discharge Precautions/Restrictions  Precautions Precautions: Fall Required Braces or Orthoses: Other Brace/Splint Other Brace/Splint: Left wrist cock-up splint while up; resting hand splint @ night to prevent contractures Restrictions Weight Bearing Restrictions: No Pain Pain Assessment Pain Assessment: No/denies pain ADL ADL ADL Comments: see FIM Vision/Perception  Vision- History Baseline Vision/History: No visual deficits Patient Visual Report: Blurring of vision;Other (comment) Vision- Assessment Vision Assessment?: Vision impaired- to be further tested in functional context Additional Comments: Pt reports plan for re-assessment of vision s/p discharge Perception Comments: WFL  Cognition Overall Cognitive Status: Impaired/Different from baseline Arousal/Alertness: Awake/alert Orientation Level: Oriented X4 Attention:  Sustained Focused Attention: Appears intact Sustained Attention: Impaired Sustained Attention Impairment: Verbal complex;Functional complex Selective Attention: Impaired Selective Attention Impairment: Verbal complex;Functional complex Alternating Attention: Impaired Alternating Attention Impairment: Verbal complex;Functional complex Memory: Appears intact Awareness: Appears intact Problem Solving: Impaired Problem Solving Impairment: Functional complex Executive Function: Organizing;Sequencing Sequencing: Impaired Sequencing Impairment: Functional complex Organizing: Impaired Organizing Impairment: Functional complex Behaviors: Perseveration Safety/Judgment: Appears intact Sensation Sensation Light Touch: Appears Intact Stereognosis: Appears Intact Hot/Cold: Appears Intact Proprioception: Appears Intact Coordination Gross Motor Movements are Fluid and Coordinated: No Coordination and Movement Description: incoordination BUE with impaired dexterity d/t progressive decline with [facility] Motor  Motor Motor: Abnormal postural alignment and control;Abnormal tone Motor - Skilled Clinical Observations: increased clonus in bilateral plantarflexors, increased tone in Bilateral quads; BUE weakness with decline in [facility] Mobility  Bed Mobility Bed Mobility: Rolling Left;Supine to Sit;Sit to Supine;Sitting - Scoot to [employer] of Bed Rolling Left: 6: Modified independent (Device/Increase time) Supine to Sit: 6: Modified independent (Device/Increase time) Sitting - Scoot to Edge of Bed: 6: Modified independent (Device/Increase time) Sit to Supine: 6: Modified independent (Device/Increase time) Transfers Transfers: Sit to Stand;Stand to Sit Sit to Stand: 5: Supervision Sit to Stand Details: Verbal cues for precautions/safety Stand to Sit: 5: Supervision Stand to Sit Details (indicate cue type and reason): Verbal cues for precautions/safety  Trunk/Postural Assessment  Cervical Assessment Cervical  Assessment: Within Functional Limits Thoracic Assessment Thoracic Assessment: Within Functional Limits Lumbar Assessment Lumbar Assessment: Within Functional Limits Postural Control Postural Control: Deficits on evaluation Righting Reactions: delayed Protective Responses: delayed  Balance Balance Balance Assessed: Yes Static Sitting Balance Static Sitting - Balance Support: Bilateral upper extremity supported;No upper extremity supported;Feet supported Static Sitting - Level of Assistance: 5: Stand by assistance Static Standing Balance Static Standing - Balance Support: Bilateral upper extremity supported Static Standing - Level of Assistance: 4: Min assist Dynamic Standing Balance Dynamic Standing - Balance Support: Bilateral  upper extremity supported Dynamic Standing - Level of Assistance: 4: Min assist Dynamic Standing - Balance Activities: Reaching for objects;Forward lean/weight shifting;Lateral lean/weight shifting Extremity/Trunk Assessment RUE Assessment RUE Assessment: Exceptions to [facility] RUE Strength RUE Overall Strength: Deficits Gross Grasp: Impaired RUE Tone RUE Tone: Mild LUE Assessment LUE Assessment: Exceptions to [facility] LUE Strength LUE Overall Strength: Deficits;Due to premorbid status LUE Tone LUE Tone: Hypertonic Hypertonic Details: imbalanced muscle activation: hypertonic flexion at fingers and thumb  See FIM for current functional status  [place] 11/05/2013, 7:19 AM

## 2013-11-06 ENCOUNTER — Telehealth: Payer: Self-pay

## 2013-11-06 NOTE — Telephone Encounter (Signed)
Jim(OT @ [place]) is requesting a verbal order to extend OT. Attempted to contact Rosanne Ashing. Left a message on a verified voicemail. Verbal given.   Rosanne Ashing is also requesting a order for a wheelchair cushion for the patient. Is that okay?

## 2013-11-09 NOTE — Telephone Encounter (Signed)
Notified Jim.

## 2013-11-09 NOTE — Telephone Encounter (Signed)
Yes to both thanks

## 2013-11-12 ENCOUNTER — Telehealth: Payer: Self-pay

## 2013-11-12 DIAGNOSIS — G1221 Amyotrophic lateral sclerosis: Secondary | ICD-10-CM

## 2013-11-12 DIAGNOSIS — M6281 Muscle weakness (generalized): Secondary | ICD-10-CM

## 2013-11-12 NOTE — Telephone Encounter (Signed)
Jim @ [facility] is requesting a RX be faxed to [facility] @ [phone] for a Gel WC cushion and a right upper extremity resting hand splint. Is that okay? 

## 2013-11-13 NOTE — Telephone Encounter (Signed)
That would be great.

## 2013-11-18 ENCOUNTER — Encounter: Payer: Self-pay | Admitting: Physical Medicine & Rehabilitation

## 2013-11-18 NOTE — Telephone Encounter (Deleted)
Order faxed to [facility].  Rosanne Ashing notified

## 2013-11-18 NOTE — Telephone Encounter (Deleted)
Katherine Potts @ [facility] is requesting a RX be faxed to [facility] @ [phone] for a Gel WC cushion and a right upper extremity resting hand splint. Is that okay?

## 2013-11-18 NOTE — Telephone Encounter (Signed)
Order faxed to [facility] and Rosanne Ashing notified

## 2013-11-18 NOTE — Telephone Encounter (Deleted)
Sent request for prior approval to [facility] via electronic submission on [email or web address]. Awaiting approval

## 2013-11-23 ENCOUNTER — Telehealth: Payer: Self-pay | Admitting: *Deleted

## 2013-11-23 NOTE — Telephone Encounter (Signed)
Social worker - Maryruth Hancock called looking for verbal order for 2 visits to work with patient and family

## 2013-11-23 NOTE — Telephone Encounter (Signed)
Verbal ok to proceed with 2 home visits

## 2013-12-01 ENCOUNTER — Other Ambulatory Visit: Payer: Self-pay | Admitting: *Deleted

## 2013-12-01 MED ORDER — BACLOFEN 5 MG HALF TABLET: 5.0000 mg | ORAL_TABLET | Freq: Two times a day (BID) | ORAL | Status: DC

## 2013-12-01 MED ORDER — PREGABALIN 50 MG PO CAPS: 50.0000 mg | ORAL_CAPSULE | Freq: Two times a day (BID) | ORAL | Status: DC

## 2013-12-01 MED ORDER — ESOMEPRAZOLE MAGNESIUM 40 MG PO CPDR: 40.0000 mg | DELAYED_RELEASE_CAPSULE | Freq: Two times a day (BID) | ORAL | Status: DC

## 2013-12-01 NOTE — Telephone Encounter (Signed)
recvd fax refill request from pharmacy.  Sent in electronically

## 2013-12-07 ENCOUNTER — Telehealth: Payer: Self-pay | Admitting: Neurology

## 2013-12-07 NOTE — Telephone Encounter (Signed)
Olegario MessierKathy with [facility] @ [phone], stated patient's son called and stated patient has gotten worse and he needs help.  Questioning if Dr. Anne HahnWillis feels patient is eligible for Hospice, and if so could he send an order and would he be attending MD with Hospice? Olegario MessierKathy stated [facility] help with assist with management of patient, could make house call, write scripts, etc.  Please call and advise.

## 2013-12-08 ENCOUNTER — Telehealth: Payer: Self-pay | Admitting: *Deleted

## 2013-12-08 NOTE — Telephone Encounter (Signed)
I called Katherine MessierKathy. The patient has not been seen since April of 2015, she did not show for an appointment in July 2015. I'll get an appointment set up for her, and assess the condition at that time. The patient likely is a candidate for hospice.

## 2013-12-08 NOTE — Telephone Encounter (Signed)
Patient has not been seen since April 2015. Dr. Anne HahnWillis needs to follow up with the patient. We ask that she calls and schedule follow up appointment.

## 2013-12-08 NOTE — Telephone Encounter (Signed)
Left message for patient's son to call office and set up an appointment so doctor can evaluate and do referrals as needed.

## 2013-12-15 ENCOUNTER — Emergency Department (HOSPITAL_COMMUNITY): Payer: Medicare Other

## 2013-12-15 ENCOUNTER — Encounter (HOSPITAL_COMMUNITY): Payer: Self-pay | Admitting: Emergency Medicine

## 2013-12-15 ENCOUNTER — Inpatient Hospital Stay (HOSPITAL_COMMUNITY)
Admission: EM | Admit: 2013-12-15 | Discharge: 2013-12-18 | DRG: 391 | Disposition: A | Discharge status: Skilled Nursing Facility | Payer: Medicare Other | Attending: Internal Medicine | Admitting: Internal Medicine

## 2013-12-15 DIAGNOSIS — I129 Hypertensive chronic kidney disease with stage 1 through stage 4 chronic kidney disease, or unspecified chronic kidney disease: Secondary | ICD-10-CM | POA: Diagnosis not present

## 2013-12-15 DIAGNOSIS — F329 Major depressive disorder, single episode, unspecified: Secondary | ICD-10-CM | POA: Diagnosis present

## 2013-12-15 DIAGNOSIS — D638 Anemia in other chronic diseases classified elsewhere: Secondary | ICD-10-CM | POA: Diagnosis present

## 2013-12-15 DIAGNOSIS — Z87891 Personal history of nicotine dependence: Secondary | ICD-10-CM | POA: Diagnosis not present

## 2013-12-15 DIAGNOSIS — I341 Nonrheumatic mitral (valve) prolapse: Secondary | ICD-10-CM | POA: Diagnosis present

## 2013-12-15 DIAGNOSIS — E43 Unspecified severe protein-calorie malnutrition: Secondary | ICD-10-CM | POA: Diagnosis not present

## 2013-12-15 DIAGNOSIS — E860 Dehydration: Secondary | ICD-10-CM | POA: Diagnosis not present

## 2013-12-15 DIAGNOSIS — R532 Functional quadriplegia: Secondary | ICD-10-CM | POA: Diagnosis present

## 2013-12-15 DIAGNOSIS — K219 Gastro-esophageal reflux disease without esophagitis: Secondary | ICD-10-CM | POA: Diagnosis present

## 2013-12-15 DIAGNOSIS — N182 Chronic kidney disease, stage 2 (mild): Secondary | ICD-10-CM | POA: Diagnosis present

## 2013-12-15 DIAGNOSIS — G1221 Amyotrophic lateral sclerosis: Secondary | ICD-10-CM | POA: Diagnosis not present

## 2013-12-15 DIAGNOSIS — D631 Anemia in chronic kidney disease: Secondary | ICD-10-CM | POA: Diagnosis present

## 2013-12-15 DIAGNOSIS — K5900 Constipation, unspecified: Secondary | ICD-10-CM | POA: Diagnosis not present

## 2013-12-15 DIAGNOSIS — F0390 Unspecified dementia without behavioral disturbance: Secondary | ICD-10-CM | POA: Diagnosis present

## 2013-12-15 DIAGNOSIS — F419 Anxiety disorder, unspecified: Secondary | ICD-10-CM | POA: Diagnosis present

## 2013-12-15 DIAGNOSIS — F1290 Cannabis use, unspecified, uncomplicated: Secondary | ICD-10-CM | POA: Diagnosis not present

## 2013-12-15 DIAGNOSIS — Z905 Acquired absence of kidney: Secondary | ICD-10-CM | POA: Diagnosis present

## 2013-12-15 DIAGNOSIS — R531 Weakness: Secondary | ICD-10-CM

## 2013-12-15 DIAGNOSIS — R112 Nausea with vomiting, unspecified: Secondary | ICD-10-CM | POA: Diagnosis present

## 2013-12-15 DIAGNOSIS — R1115 Cyclical vomiting syndrome unrelated to migraine: Secondary | ICD-10-CM

## 2013-12-15 DIAGNOSIS — N179 Acute kidney failure, unspecified: Secondary | ICD-10-CM | POA: Diagnosis not present

## 2013-12-15 DIAGNOSIS — F32A Depression, unspecified: Secondary | ICD-10-CM | POA: Diagnosis present

## 2013-12-15 DIAGNOSIS — R1110 Vomiting, unspecified: Secondary | ICD-10-CM

## 2013-12-15 DIAGNOSIS — R109 Unspecified abdominal pain: Secondary | ICD-10-CM | POA: Diagnosis not present

## 2013-12-15 LAB — URINE MICROSCOPIC-ADD ON

## 2013-12-15 LAB — COMPREHENSIVE METABOLIC PANEL
ALT: 11 U/L (ref 0–35)
ANION GAP: 13 (ref 5–15)
AST: 15 U/L (ref 0–37)
Albumin: 3.4 g/dL — ABNORMAL LOW (ref 3.5–5.2)
Alkaline Phosphatase: 71 U/L (ref 39–117)
BILIRUBIN TOTAL: 0.2 mg/dL — AB (ref 0.3–1.2)
BUN: 24 mg/dL — AB (ref 6–23)
CHLORIDE: 103 meq/L (ref 96–112)
CO2: 25 mEq/L (ref 19–32)
Calcium: 10.8 mg/dL — ABNORMAL HIGH (ref 8.4–10.5)
Creatinine, Ser: 1.17 mg/dL — ABNORMAL HIGH (ref 0.50–1.10)
GFR calc non Af Amer: 53 mL/min — ABNORMAL LOW (ref >90)
GFR, EST AFRICAN AMERICAN: 61 mL/min — AB (ref >90)
GLUCOSE: 87 mg/dL (ref 70–99)
Potassium: 4.2 mEq/L (ref 3.7–5.3)
Sodium: 141 mEq/L (ref 137–147)
TOTAL PROTEIN: 7.2 g/dL (ref 6.0–8.3)

## 2013-12-15 LAB — URINALYSIS, ROUTINE W REFLEX MICROSCOPIC
BILIRUBIN URINE: NEGATIVE
Glucose, UA: NEGATIVE mg/dL
HGB URINE DIPSTICK: NEGATIVE
KETONES UR: NEGATIVE mg/dL
NITRITE: NEGATIVE
PROTEIN: NEGATIVE mg/dL
SPECIFIC GRAVITY, URINE: 1.006 (ref 1.005–1.030)
UROBILINOGEN UA: 0.2 mg/dL (ref 0.0–1.0)
pH: 5.5 (ref 5.0–8.0)

## 2013-12-15 LAB — CBC WITH DIFFERENTIAL/PLATELET
BASOS PCT: 0 % (ref 0–1)
Basophils Absolute: 0 10*3/uL (ref 0.0–0.1)
Eosinophils Absolute: 0.2 10*3/uL (ref 0.0–0.7)
Eosinophils Relative: 3 % (ref 0–5)
HCT: 33.6 % — ABNORMAL LOW (ref 36.0–46.0)
Hemoglobin: 11.2 g/dL — ABNORMAL LOW (ref 12.0–15.0)
LYMPHS ABS: 2.1 10*3/uL (ref 0.7–4.0)
Lymphocytes Relative: 33 % (ref 12–46)
MCH: 30.9 pg (ref 26.0–34.0)
MCHC: 33.3 g/dL (ref 30.0–36.0)
MCV: 92.8 fL (ref 78.0–100.0)
MONOS PCT: 4 % (ref 3–12)
Monocytes Absolute: 0.3 10*3/uL (ref 0.1–1.0)
Neutro Abs: 3.8 10*3/uL (ref 1.7–7.7)
Neutrophils Relative %: 60 % (ref 43–77)
Platelets: 200 10*3/uL (ref 150–400)
RBC: 3.62 MIL/uL — ABNORMAL LOW (ref 3.87–5.11)
RDW: 12.7 % (ref 11.5–15.5)
WBC: 6.4 10*3/uL (ref 4.0–10.5)

## 2013-12-15 LAB — LIPASE, BLOOD: LIPASE: 15 U/L (ref 11–59)

## 2013-12-15 MED ORDER — CALCIUM CARBONATE ANTACID 500 MG PO CHEW
1.0000 | CHEWABLE_TABLET | Freq: Every day | ORAL | Status: DC
  Administered 2013-12-15: 200 mg via ORAL
  Filled 2013-12-15 (×3): qty 1

## 2013-12-15 MED ORDER — PANTOPRAZOLE SODIUM 40 MG PO TBEC
40.0000 mg | DELAYED_RELEASE_TABLET | Freq: Every day | ORAL | Status: DC
  Administered 2013-12-15 – 2013-12-18 (×4): 40 mg via ORAL
  Filled 2013-12-15 (×5): qty 1

## 2013-12-15 MED ORDER — DEXAMETHASONE 2 MG PO TABS
2.0000 mg | ORAL_TABLET | Freq: Every day | ORAL | Status: DC
  Administered 2013-12-15 – 2013-12-18 (×4): 2 mg via ORAL
  Filled 2013-12-15 (×4): qty 1

## 2013-12-15 MED ORDER — ONDANSETRON HCL 4 MG PO TABS
4.0000 mg | ORAL_TABLET | Freq: Four times a day (QID) | ORAL | Status: DC
  Administered 2013-12-15 – 2013-12-18 (×8): 4 mg via ORAL
  Filled 2013-12-15 (×19): qty 1

## 2013-12-15 MED ORDER — IOHEXOL 300 MG/ML  SOLN
100.0000 mL | Freq: Once | INTRAMUSCULAR | Status: AC | PRN
  Administered 2013-12-15: 100 mL via INTRAVENOUS

## 2013-12-15 MED ORDER — FLUOXETINE HCL 20 MG PO CAPS
20.0000 mg | ORAL_CAPSULE | Freq: Every day | ORAL | Status: DC
  Administered 2013-12-16 – 2013-12-18 (×3): 20 mg via ORAL
  Filled 2013-12-15 (×4): qty 1

## 2013-12-15 MED ORDER — DIAZEPAM 5 MG PO TABS
2.5000 mg | ORAL_TABLET | Freq: Two times a day (BID) | ORAL | Status: DC | PRN
  Administered 2013-12-15 – 2013-12-17 (×4): 2.5 mg via ORAL
  Filled 2013-12-15 (×4): qty 1

## 2013-12-15 MED ORDER — ONDANSETRON HCL 4 MG/2ML IJ SOLN
4.0000 mg | Freq: Once | INTRAMUSCULAR | Status: AC
  Administered 2013-12-15: 4 mg via INTRAVENOUS
  Filled 2013-12-15: qty 2

## 2013-12-15 MED ORDER — VITAMIN C 500 MG PO TABS
500.0000 mg | ORAL_TABLET | Freq: Every day | ORAL | Status: DC
  Administered 2013-12-15 – 2013-12-18 (×4): 500 mg via ORAL
  Filled 2013-12-15 (×4): qty 1

## 2013-12-15 MED ORDER — OXYCODONE HCL 5 MG PO TABS
10.0000 mg | ORAL_TABLET | ORAL | Status: DC | PRN
  Administered 2013-12-15 – 2013-12-18 (×10): 10 mg via ORAL
  Filled 2013-12-15 (×10): qty 2

## 2013-12-15 MED ORDER — PROMETHAZINE HCL 25 MG PO TABS
25.0000 mg | ORAL_TABLET | Freq: Four times a day (QID) | ORAL | Status: DC | PRN
  Administered 2013-12-16 – 2013-12-17 (×2): 25 mg via ORAL
  Filled 2013-12-15 (×2): qty 1

## 2013-12-15 MED ORDER — PREGABALIN 50 MG PO CAPS
50.0000 mg | ORAL_CAPSULE | Freq: Two times a day (BID) | ORAL | Status: DC
  Administered 2013-12-16: 50 mg via ORAL
  Filled 2013-12-15 (×6): qty 1

## 2013-12-15 MED ORDER — SODIUM CHLORIDE 0.9 % IV SOLN
INTRAVENOUS | Status: DC
  Administered 2013-12-15 – 2013-12-16 (×2): via INTRAVENOUS

## 2013-12-15 MED ORDER — SODIUM CHLORIDE 0.9 % IV SOLN: INTRAVENOUS | Status: DC

## 2013-12-15 MED ORDER — BACLOFEN 5 MG HALF TABLET
5.0000 mg | ORAL_TABLET | Freq: Two times a day (BID) | ORAL | Status: DC
  Administered 2013-12-15 – 2013-12-18 (×6): 5 mg via ORAL
  Filled 2013-12-15 (×7): qty 1

## 2013-12-15 MED ORDER — HYDROMORPHONE HCL 1 MG/ML IJ SOLN
1.0000 mg | Freq: Once | INTRAMUSCULAR | Status: AC
  Administered 2013-12-15: 1 mg via INTRAVENOUS
  Filled 2013-12-15: qty 1

## 2013-12-15 MED ORDER — METHADONE HCL 5 MG PO TABS
5.0000 mg | ORAL_TABLET | Freq: Two times a day (BID) | ORAL | Status: DC
  Administered 2013-12-15 – 2013-12-18 (×6): 5 mg via ORAL
  Filled 2013-12-15 (×6): qty 1

## 2013-12-15 MED ORDER — SODIUM CHLORIDE 0.9 % IV BOLUS (SEPSIS)
1000.0000 mL | Freq: Once | INTRAVENOUS | Status: AC
  Administered 2013-12-15: 1000 mL via INTRAVENOUS

## 2013-12-15 MED ORDER — LORAZEPAM 1 MG PO TABS
2.0000 mg | ORAL_TABLET | Freq: Three times a day (TID) | ORAL | Status: DC | PRN
  Administered 2013-12-15 – 2013-12-17 (×5): 2 mg via ORAL
  Administered 2013-12-17: 1 mg via ORAL
  Administered 2013-12-18: 2 mg via ORAL
  Administered 2013-12-18: 1 mg via ORAL
  Filled 2013-12-15 (×9): qty 2

## 2013-12-15 MED ORDER — ENOXAPARIN SODIUM 40 MG/0.4ML ~~LOC~~ SOLN
40.0000 mg | SUBCUTANEOUS | Status: DC
  Administered 2013-12-15 – 2013-12-17 (×3): 40 mg via SUBCUTANEOUS
  Filled 2013-12-15 (×4): qty 0.4

## 2013-12-15 MED ORDER — ARIPIPRAZOLE 2 MG PO TABS
2.0000 mg | ORAL_TABLET | Freq: Every day | ORAL | Status: DC
  Administered 2013-12-15: 2 mg via ORAL
  Filled 2013-12-15 (×2): qty 1

## 2013-12-15 MED ORDER — INFLUENZA VAC SPLIT QUAD 0.5 ML IM SUSY
0.5000 mL | PREFILLED_SYRINGE | INTRAMUSCULAR | Status: AC
  Administered 2013-12-16: 0.5 mL via INTRAMUSCULAR
  Filled 2013-12-15 (×2): qty 0.5

## 2013-12-15 NOTE — ED Notes (Signed)
Pt c/o abdominal pain and nausea x 3 days.

## 2013-12-15 NOTE — H&P (Addendum)
[facility] History and Physical  Katherine Potts ZOX:[id number] DOB: 1961/02/23 DOA: 12/15/2013  Referring physician: ED physician PCP: Gretel Acre, MD   Chief Complaint: N/V  HPI:  Pt is 53 yo female with chronic nausea, ALS, progressive deconditioning, on multiple medications for nausea including Zofran, phenergan, valium, PPI's, presenting to [facility] ED with main concern of one week duration of worsening nausea and non bloody vomiting, worse compared to her baseline, unable to keep anything down, associated with poor oral intake and malaise. PT denies fevers, chills, no chest pain or shortness of breath, no blood in urine or stool, no recent sick contacts or exposures.   In ED, pt is hemodynamically stable, VSS, blood work unremarkable. Pt does not feel comfortable going home. [facility] asked to admit for observation.   Assessment and Plan: Acute nausea and vomiting - chronic in nature, CT abd with no specific revealing etiology - admit to medical floor - pt is on multiple sedating medications which can decrease motility of the gut significantly and likely contributing to her nausea - she is on phenergan, valium, ativan, Prozac, methadone, Baclofen, oxycodone, lyrica --> ALL CAN CAUSE N/V - I would not recommend addition of any new medications to her regimen as it will likely not help  - provide supportive care with IVF, analgesia and antiemetics as needed - if electrolytes stable, d/c in AM Anxiety and depression - rather anxious on exam - continue home medical regimen  Acute on chronic renal failure, stage II - Cr at baseline - BMP in AM ALS - progressive decline, requiring inpatient rehab recently but continues to decline clinically - PT eval  Lovenox for DVT prophylaxis   Radiological Exams on Admission: Ct Abdomen Pelvis W Contrast  12/15/2013    1. No acute inflammatory process within abdomen or pelvis.  2. Surgically absent right kidney.  No left hydronephrosis.  3.  No pericecal inflammation.  Normal appendix.  4. Scattered diverticula are noted sigmoid colon. No evidence of acute diverticulitis.   Dg Chest Portable 1 View  12/15/2013  1. Mild right basilar atelectasis.    Code Status: Full Family Communication: Pt at bedside Disposition Plan: Admit for further evaluation     Review of Systems:  Constitutional: Negative for diaphoresis.  HENT: Negative for hearing loss, ear pain, nosebleeds, congestion, sore throat, neck pain, tinnitus and ear discharge.   Eyes: Negative for blurred vision, double vision, photophobia, pain, discharge and redness.  Respiratory: Negative for cough, hemoptysis, sputum production, shortness of breath, wheezing and stridor.   Cardiovascular: Negative for chest pain, palpitations, orthopnea, claudication and leg swelling.  Gastrointestinal: Per HPI  Genitourinary: Negative for dysuria, urgency, frequency, hematuria and flank pain.  Musculoskeletal: Negative for myalgias, back pain, joint pain and falls.  Skin: Negative for itching and rash.  Neurological: Negative for dizziness and weakness.  Endo/Heme/Allergies: Negative for environmental allergies and polydipsia. Does not bruise/bleed easily.  Psychiatric/Behavioral: Negative for suicidal ideas. The patient is not nervous/anxious.      Past Medical History  Diagnosis Date  . GERD (gastroesophageal reflux disease)   . Anxiety   . Depression   . MVP (mitral valve prolapse)   . Cervical spondylosis   . 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 Pneumonia   . Constipation     Past Surgical History  Procedure Laterality Date  . Eye surgery    . Breast surgery    . Vagina surgery      mesh  .  Kidney donation      Right nephrectomy  . Abdominal hernia repair    . Orif wrist fracture  07/09/2013    DR Melvyn NovasTMANN  . Orif wrist fracture Left 07/09/2013    Procedure: OPEN REDUCTION INTERNAL FIXATION (ORIF) LEFT  WRIST FRACTURE;  Surgeon:  Sharma CovertFred W Ortmann, MD;  Location: [facility] OR;  Service: Orthopedics;  Laterality: Left;    Social History:  reports that she has quit smoking. Her smoking use included Cigarettes and Cigars. She smoked 0.40 packs per day. She has never used smokeless tobacco. She reports that she uses illicit drugs (Marijuana). She reports that she does not drink alcohol.  Allergies  Allergen Reactions  . Bactrim [Sulfamethoxazole-Trimethoprim] Nausea Only  . Ciprofloxacin Nausea And Vomiting    Family History  Problem Relation Age of Onset  . Bipolar disorder Brother   . Dementia Mother   . Hypertension Father   . ALS Cousin     Prior to Admission medications   Medication Sig Start Date End Date Taking? Authorizing Provider  ARIPiprazole (ABILIFY) 2 MG tablet Take 1 tablet (2 mg total) by mouth daily. 11/03/13  Yes Daniel J Angiulli, PA-C  baclofen (LIORESAL) 5 mg TABS tablet Take 0.5 tablets (5 mg total) by mouth 2 (two) times daily. 12/01/13  Yes Ranelle OysterZachary T Swartz, MD  calcium carbonate (TUMS - DOSED IN MG ELEMENTAL CALCIUM) 500 MG chewable tablet Chew 1 tablet by mouth daily.   Yes Historical Provider, MD  dexamethasone (DECADRON) 2 MG tablet Take 1 tablet (2 mg total) by mouth daily. 11/03/13  Yes Daniel J Angiulli, PA-C  diazepam (VALIUM) 5 MG tablet Take 0.5 tablets (2.5 mg total) by mouth every 12 (twelve) hours as needed for muscle spasms. 11/03/13  Yes Daniel J Angiulli, PA-C  esomeprazole (NEXIUM) 40 MG capsule Take 1 capsule (40 mg total) by mouth 2 (two) times daily. 12/01/13  Yes Ranelle OysterZachary T Swartz, MD  FLUoxetine (PROZAC) 20 MG capsule Take 1 capsule (20 mg total) by mouth daily. 11/03/13  Yes Daniel J Angiulli, PA-C  LORazepam (ATIVAN) 1 MG tablet Take 2 tablets (2 mg total) by mouth 3 (three) times daily as needed for anxiety. 11/03/13  Yes Daniel J Angiulli, PA-C  methadone (DOLOPHINE) 5 MG tablet Take 1 tablet (5 mg total) by mouth every 12 (twelve) hours. 11/03/13  Yes Daniel J Angiulli, PA-C  ondansetron  (ZOFRAN) 4 MG tablet Take 1 tablet (4 mg total) by mouth every 6 (six) hours. 10/18/13  Yes Monte FantasiaJoseph W Mintz, PA-C  oxyCODONE 10 MG TABS Take 1 tablet (10 mg total) by mouth every 4 (four) hours as needed for moderate pain. 11/03/13  Yes Daniel J Angiulli, PA-C  pregabalin (LYRICA) 50 MG capsule Take 1 capsule (50 mg total) by mouth 2 (two) times daily. 12/01/13  Yes Ranelle OysterZachary T Swartz, MD  promethazine (PHENERGAN) 25 MG tablet Take 25 mg by mouth every 6 (six) hours as needed for nausea or vomiting.   Yes Historical Provider, MD  vitamin C (ASCORBIC ACID) 500 MG tablet Take 1 tablet (500 mg total) by mouth daily. 07/09/13  Yes Sharma CovertFred W Ortmann, MD  lidocaine (LIDODERM) 5 % Place 2 patches onto the skin daily. Remove & Discard patch within 12 hours or as directed by MD 11/03/13   Charlton Amoraniel J Angiulli, PA-C    Physical Exam: Filed Vitals:   12/15/13 1608 12/15/13 1732 12/15/13 1842  BP: 108/71  130/85  Pulse: 70  78  Temp: 98.8 F (37.1 C) 98.1  F (36.7 C) 98.4 F (36.9 C)  TempSrc: Oral Rectal Oral  Resp: 16  18  SpO2: 98%  94%    Physical Exam  Constitutional: Appears well-developed and well-nourished. No distress.  HENT: Normocephalic. External right and left ear normal. Oropharynx is clear and moist.  Eyes: Conjunctivae and EOM are normal. PERRLA, no scleral icterus.  Neck: Normal ROM. Neck supple. No JVD. No tracheal deviation. No thyromegaly.  CVS: RRR, S1/S2 +, no murmurs, no gallops, no carotid bruit.  Pulmonary: Effort and breath sounds normal, no stridor, rhonchi, wheezes, rales.  Abdominal: Soft. BS +,  no distension, tenderness, rebound or guarding.  Musculoskeletal: Normal range of motion. No edema and no tenderness.  Lymphadenopathy: No lymphadenopathy noted, cervical, inguinal. Neuro: Alert. Normal reflexes, muscle tone coordination. No cranial nerve deficit. Skin: Skin is warm and dry. No rash noted. Not diaphoretic. No erythema. No pallor.  Psychiatric: Normal mood and affect.  Behavior, judgment, thought content normal.   Labs on Admission:  Basic Metabolic Panel:  Recent Labs Lab 12/15/13 1746  NA 141  K 4.2  CL 103  CO2 25  GLUCOSE 87  BUN 24*  CREATININE 1.17*  CALCIUM 10.8*   Liver Function Tests:  Recent Labs Lab 12/15/13 1746  AST 15  ALT 11  ALKPHOS 71  BILITOT 0.2*  PROT 7.2  ALBUMIN 3.4*    Recent Labs Lab 12/15/13 1746  LIPASE 15   CBC:  Recent Labs Lab 12/15/13 1746  WBC 6.4  NEUTROABS 3.8  HGB 11.2*  HCT 33.6*  MCV 92.8  PLT 200   EKG: Normal sinus rhythm, no ST/T wave changes  Debbora PrestoMAGICK-Branston Halsted, MD  [facility] Pager [phone]  If 7PM-7AM, please contact night-coverage [email or web address] Password [email or web address] 12/15/2013, 8:48 PM

## 2013-12-15 NOTE — ED Provider Notes (Signed)
CSN: [id number]     Arrival date & time 12/15/13  1605 History   First MD Initiated Contact with Patient 12/15/13 1633     Chief Complaint  Patient presents with  . Abdominal Pain  . Nausea     (Consider location/radiation/quality/duration/timing/severity/associated sxs/prior Treatment) HPI 53 year old female presents with abdominal pain and nausea and vomiting over the past 3 days. States she's felt warm but has not taken her temperature. Denies any dysuria. The pain is in her left upper quadrant. She denies having similar symptoms the chart review shows she's had pain and nausea at this before. She's been unable to keep down any of her chronic pain medicines due to the nausea and vomiting. Denies any diarrhea or constipation. States she's had a little bit of a cough and some shortness of breath. Denies any chest pain. States she's having back pain that has her chronic back pain made worse by not having her pain medicines.  Past Medical History  Diagnosis Date  . GERD (gastroesophageal reflux disease)   . Anxiety   . Depression   . MVP (mitral valve prolapse)   . Cervical spondylosis   . 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 Pneumonia   . Constipation    Past Surgical History  Procedure Laterality Date  . Eye surgery    . Breast surgery    . Vagina surgery      mesh  . Kidney donation      Right nephrectomy  . Abdominal hernia repair    . Orif wrist fracture  07/09/2013    DR Melvyn NovasTMANN  . Orif wrist fracture Left 07/09/2013    Procedure: OPEN REDUCTION INTERNAL FIXATION (ORIF) LEFT  WRIST FRACTURE;  Surgeon: Sharma CovertFred W Ortmann, MD;  Location: [facility] OR;  Service: Orthopedics;  Laterality: Left;   Family History  Problem Relation Age of Onset  . Bipolar disorder Brother   . Dementia Mother   . Hypertension Father   . ALS Cousin    History  Substance Use Topics  . Smoking status: Former Smoker -- 0.40 packs/day    Types: Cigarettes, Cigars  .  Smokeless tobacco: Never Used  . Alcohol Use: No     Comment: none   OB History   Grav Para Term Preterm Abortions TAB SAB Ect Mult Living                 Review of Systems  Constitutional: Positive for fever.  Respiratory: Positive for cough and shortness of breath.   Cardiovascular: Negative for chest pain.  Gastrointestinal: Positive for nausea, vomiting and abdominal pain. Negative for diarrhea and constipation.  Genitourinary: Negative for dysuria.  Musculoskeletal: Positive for back pain.  All other systems reviewed and are negative.     Allergies  Bactrim and Ciprofloxacin  Home Medications   Prior to Admission medications   Medication Sig Start Date End Date Taking? Authorizing Provider  ARIPiprazole (ABILIFY) 2 MG tablet Take 1 tablet (2 mg total) by mouth daily. 11/03/13  Yes Daniel J Angiulli, PA-C  baclofen (LIORESAL) 5 mg TABS tablet Take 0.5 tablets (5 mg total) by mouth 2 (two) times daily. 12/01/13  Yes Ranelle OysterZachary T Swartz, MD  calcium carbonate (TUMS - DOSED IN MG ELEMENTAL CALCIUM) 500 MG chewable tablet Chew 1 tablet by mouth daily.   Yes Historical Provider, MD  dexamethasone (DECADRON) 2 MG tablet Take 1 tablet (2 mg total) by mouth daily. 11/03/13  Yes Mcarthur Rossettianiel J Angiulli, PA-C  diazepam (VALIUM) 5 MG tablet Take 0.5 tablets (2.5 mg total) by mouth every 12 (twelve) hours as needed for muscle spasms. 11/03/13  Yes Daniel J Angiulli, PA-C  esomeprazole (NEXIUM) 40 MG capsule Take 1 capsule (40 mg total) by mouth 2 (two) times daily. 12/01/13  Yes Ranelle OysterZachary T Swartz, MD  FLUoxetine (PROZAC) 20 MG capsule Take 1 capsule (20 mg total) by mouth daily. 11/03/13  Yes Daniel J Angiulli, PA-C  LORazepam (ATIVAN) 1 MG tablet Take 2 tablets (2 mg total) by mouth 3 (three) times daily as needed for anxiety. 11/03/13  Yes Daniel J Angiulli, PA-C  methadone (DOLOPHINE) 5 MG tablet Take 1 tablet (5 mg total) by mouth every 12 (twelve) hours. 11/03/13  Yes Daniel J Angiulli, PA-C  ondansetron  (ZOFRAN) 4 MG tablet Take 1 tablet (4 mg total) by mouth every 6 (six) hours. 10/18/13  Yes Monte FantasiaJoseph W Mintz, PA-C  oxyCODONE 10 MG TABS Take 1 tablet (10 mg total) by mouth every 4 (four) hours as needed for moderate pain. 11/03/13  Yes Daniel J Angiulli, PA-C  pregabalin (LYRICA) 50 MG capsule Take 1 capsule (50 mg total) by mouth 2 (two) times daily. 12/01/13  Yes Ranelle OysterZachary T Swartz, MD  promethazine (PHENERGAN) 25 MG tablet Take 25 mg by mouth every 6 (six) hours as needed for nausea or vomiting.   Yes Historical Provider, MD  vitamin C (ASCORBIC ACID) 500 MG tablet Take 1 tablet (500 mg total) by mouth daily. 07/09/13  Yes Sharma CovertFred W Ortmann, MD  lidocaine (LIDODERM) 5 % Place 2 patches onto the skin daily. Remove & Discard patch within 12 hours or as directed by MD 11/03/13   Mcarthur Rossettianiel J Angiulli, PA-C   BP 108/71  Pulse 70  Temp(Src) 98.8 F (37.1 C) (Oral)  Resp 16  SpO2 98% Physical Exam  Nursing note and vitals reviewed. Constitutional: She is oriented to person, place, and time. She appears well-developed and well-nourished.  HENT:  Head: Normocephalic and atraumatic.  Right Ear: External ear normal.  Left Ear: External ear normal.  Nose: Nose normal.  Eyes: Right eye exhibits no discharge. Left eye exhibits no discharge.  Cardiovascular: Normal rate, regular rhythm and normal heart sounds.   Pulmonary/Chest: Effort normal and breath sounds normal.  Abdominal: Soft. She exhibits no distension. There is tenderness in the left upper quadrant. There is no rigidity, no rebound and no guarding.  Neurological: She is alert and oriented to person, place, and time.  Skin: Skin is warm and dry.    ED Course  Procedures (including critical care time) Labs Review Labs Reviewed  CBC WITH DIFFERENTIAL - Abnormal; Notable for the following:    RBC 3.62 (*)    Hemoglobin 11.2 (*)    HCT 33.6 (*)    All other components within normal limits  COMPREHENSIVE METABOLIC PANEL - Abnormal; Notable for the  following:    BUN 24 (*)    Creatinine, Ser 1.17 (*)    Calcium 10.8 (*)    Albumin 3.4 (*)    Total Bilirubin 0.2 (*)    GFR calc non Af Amer 53 (*)    GFR calc Af Amer 61 (*)    All other components within normal limits  URINALYSIS, ROUTINE W REFLEX MICROSCOPIC - Abnormal; Notable for the following:    APPearance CLOUDY (*)    Leukocytes, UA TRACE (*)    All other components within normal limits  LIPASE, BLOOD  URINE MICROSCOPIC-ADD ON  BASIC METABOLIC PANEL  CBC    Imaging Review Ct Abdomen Pelvis W Contrast  12/15/2013   CLINICAL DATA:  Upper abdominal pain left greater than right, nausea for 3 days, status post abdominal hernia repair, status post right nephrectomy (donor)  EXAM: CT ABDOMEN AND PELVIS WITH CONTRAST  TECHNIQUE: Multidetector CT imaging of the abdomen and pelvis was performed using the standard protocol following bolus administration of intravenous contrast.  CONTRAST:  OMNIPAQUE IOHEXOL 300 MG/ML  SOLN  COMPARISON:  10/19/2013  FINDINGS: Lung bases are unremarkable. Sagittal images of the spine are unremarkable. Liver, spleen, pancreas and adrenal glands are unremarkable. No calcified gallstones are noted within gallbladder.  Left kidney shows normal enhancement. No hydronephrosis or hydroureter. The right kidney surgically absent. Mild atherosclerotic calcifications of distal abdominal aorta and iliac arteries. Tiny umbilical hernia containing fat without evidence of acute complication.  There is no small bowel obstruction. No pericecal inflammation. Normal appendix partially visualized in axial image 46.  The terminal ileum is unremarkable.  No ascites or free air. No adenopathy. The uterus and adnexa are unremarkable. Mild distended urinary bladder. Scattered diverticula are noted sigmoid colon. No evidence of acute diverticulitis.  IMPRESSION: 1. No acute inflammatory process within abdomen or pelvis. 2. Surgically absent right kidney.  No left hydronephrosis. 3.  No pericecal inflammation.  Normal appendix. 4. Scattered diverticula are noted sigmoid colon. No evidence of acute diverticulitis.   Electronically Signed   By: Natasha Mead M.D.   On: 12/15/2013 19:20   Dg Chest Portable 1 View  12/15/2013   CLINICAL DATA:  Nausea, history of mitral valve prolapse  EXAM: PORTABLE CHEST - 1 VIEW  COMPARISON:  06/29/2013  FINDINGS: Cardiac shadow is stable. The lungs are well aerated bilaterally. Minimal right basilar atelectasis is seen. Bilateral breast implants are again noted. No bony abnormality is seen.  IMPRESSION: Mild right basilar atelectasis.   Electronically Signed   By: Alcide Clever M.D.   On: 12/15/2013 17:13     EKG Interpretation None      MDM   Final diagnoses:  Intractable vomiting with nausea, vomiting of unspecified type  Dehydration    Patient with intractable nausea with uncontrolled abdominal pain. Patient has had this before with uncertain etiology. At this point there is no surgical intervention. Given that she has poorly controlled nausea and has likely dehydration, will admit to the hospitalist for overnight observation with fluid rehydration and nausea control.    Audree Camel, MD 12/16/13 [phone]

## 2013-12-15 NOTE — ED Notes (Signed)
Bed: WA04 Expected date:  Expected time:  Means of arrival:  Comments: ems 

## 2013-12-16 DIAGNOSIS — R112 Nausea with vomiting, unspecified: Secondary | ICD-10-CM | POA: Diagnosis not present

## 2013-12-16 LAB — BASIC METABOLIC PANEL
ANION GAP: 12 (ref 5–15)
BUN: 22 mg/dL (ref 6–23)
CHLORIDE: 109 meq/L (ref 96–112)
CO2: 24 meq/L (ref 19–32)
CREATININE: 1.32 mg/dL — AB (ref 0.50–1.10)
Calcium: 9.9 mg/dL (ref 8.4–10.5)
GFR calc Af Amer: 53 mL/min — ABNORMAL LOW (ref >90)
GFR calc non Af Amer: 45 mL/min — ABNORMAL LOW (ref >90)
Glucose, Bld: 116 mg/dL — ABNORMAL HIGH (ref 70–99)
POTASSIUM: 4.7 meq/L (ref 3.7–5.3)
SODIUM: 145 meq/L (ref 137–147)

## 2013-12-16 LAB — CBC
HCT: 31.4 % — ABNORMAL LOW (ref 36.0–46.0)
HEMOGLOBIN: 10.5 g/dL — AB (ref 12.0–15.0)
MCH: 31.1 pg (ref 26.0–34.0)
MCHC: 33.4 g/dL (ref 30.0–36.0)
MCV: 92.9 fL (ref 78.0–100.0)
Platelets: 179 10*3/uL (ref 150–400)
RBC: 3.38 MIL/uL — ABNORMAL LOW (ref 3.87–5.11)
RDW: 12.9 % (ref 11.5–15.5)
WBC: 4.7 10*3/uL (ref 4.0–10.5)

## 2013-12-16 MED ORDER — CALCIUM CARBONATE ANTACID 500 MG PO CHEW
1.0000 | CHEWABLE_TABLET | Freq: Every day | ORAL | Status: DC
  Administered 2013-12-16: 200 mg via ORAL
  Filled 2013-12-16 (×4): qty 1

## 2013-12-16 MED ORDER — ARIPIPRAZOLE 2 MG PO TABS
2.0000 mg | ORAL_TABLET | Freq: Every day | ORAL | Status: DC
  Administered 2013-12-16 – 2013-12-17 (×2): 2 mg via ORAL
  Filled 2013-12-16 (×3): qty 1

## 2013-12-16 NOTE — Evaluation (Signed)
Physical Therapy Evaluation Patient Details Name: Katherine Potts MRN: [id number] DOB: 08-13-1960 Today's Date: 12/16/2013   History of Present Illness  Pt is 53 yo female with chronic nausea, ALS, progressive deconditioning, on multiple medications for nausea including Zofran, phenergan, valium, PPI's, [facility] ED with main concern of one week duration of worsening nausea and non bloody vomiting, worse compared to her baseline, unable to keep anything down, associated with poor oral intake and malaise  Clinical Impression  Pt currently requiring mod assist for performance of all mobility tasks and essentially nonambulatory with great difficulty initiating shuffling step.  Pt reports son has been assisting 24/7 and her goal is to return home. Recommend same if family is able to continue to supply level of assist needed.    Follow Up Recommendations Home health PT    Equipment Recommendations  None recommended by PT    Recommendations for Other Services OT consult     Precautions / Restrictions Precautions Precautions: Fall Restrictions Weight Bearing Restrictions: No      Mobility  Bed Mobility Overal bed mobility: Needs Assistance Bed Mobility: Supine to Sit     Supine to sit: Mod assist     General bed mobility comments: pt in chair  Transfers Overall transfer level: Needs assistance Equipment used: Rolling walker (2 wheeled) Transfers: Sit to/from Stand Sit to Stand: Min assist;Mod assist Stand pivot transfers: Mod assist       General transfer comment: pt sitting in chair, but uncomfortable.  Spent much of OT session repositioning and helping pt get comfortable.  Pt is [place]- explained dietician order placed.  Pt is not able hold phone and needs A making calls .    Ambulation/Gait Ambulation/Gait assistance: +2 physical assistance;Mod assist Ambulation Distance (Feet): 1 Feet Assistive device: Rolling walker (2 wheeled) Gait Pattern/deviations:  Shuffle;Step-to pattern Gait velocity: decr   General Gait Details: Pt demonstrating difficulty initiating step (L > R).  Ltd use of hands limiting ability to utilize Sara LeeW  Stairs            Wheelchair Mobility    Modified Rankin (Stroke Patients Only)       Balance                                             Pertinent Vitals/Pain Pain Assessment: 0-10 Pain Score: 7  Pain Location: back Pain Descriptors / Indicators: Aching Pain Intervention(s): Monitored during session;Repositioned;Patient requesting pain meds-RN notified    Home Living Family/patient expects to be discharged to:: Private residence Living Arrangements: Children Available Help at Discharge: Family;Available 24 hours/day Type of Home: House Home Access: Ramped entrance     Home Layout: One level Home Equipment: Walker - 2 wheels;Bedside commode;Transport chair;Shower seat;Adaptive equipment;Other (comment) (hoyer lift)      Prior Function Level of Independence: Needs assistance   Gait / Transfers Assistance Needed: Pt states she is nonambulatory beyond transferring bed to chair with assist of son.     Comments: Son helps with bathing, dressing, and cooking meals     Hand Dominance   Dominant Hand: Right    Extremity/Trunk Assessment   Upper Extremity Assessment: RUE deficits/detail;LUE deficits/detail RUE Deficits / Details: limited AROM due to ALS.  Decreased functional use BUE. Pt would benefit from post acute OT to address positioning.  Pt may benefit from positioning splints.  will continue to evaluate.  Lower Extremity Assessment: RLE deficits/detail;LLE deficits/detail RLE Deficits / Details: Strength 3/5 but with decreased motor control  LLE Deficits / Details: ~3/5 strength with decreased motor control     Communication   Communication: Expressive difficulties;Other (comment) (hard to understand at times)  Cognition Arousal/Alertness:  Awake/alert Behavior During Therapy: WFL for tasks assessed/performed Overall Cognitive Status: Within Functional Limits for tasks assessed                      General Comments      Exercises        Assessment/Plan    PT Assessment Patient needs continued PT services  PT Diagnosis Difficulty walking;Generalized weakness   PT Problem List Decreased strength;Decreased activity tolerance;Decreased balance;Decreased mobility;Decreased knowledge of use of DME;Obesity;Pain  PT Treatment Interventions DME instruction;Gait training;Functional mobility training;Therapeutic activities;Therapeutic exercise;Patient/family education   PT Goals (Current goals can be found in the Care Plan section) Acute Rehab PT Goals Patient Stated Goal: less pain in back PT Goal Formulation: With patient Time For Goal Achievement: 12/23/13 Potential to Achieve Goals: Fair    Frequency Min 3X/week   Barriers to discharge        Co-evaluation               End of Session Equipment Utilized During Treatment: Gait belt Activity Tolerance: Patient tolerated treatment well;Patient limited by pain Patient left: in chair;with call bell/phone within reach Nurse Communication: Mobility status         Time: [id number] PT Time Calculation (min): 32 min   Charges:   PT Evaluation $Initial PT Evaluation Tier I: 1 Procedure PT Treatments $Therapeutic Activity: 23-37 mins   PT G Codes:          Katherine Potts 12/16/2013, 12:46 PM

## 2013-12-16 NOTE — Progress Notes (Signed)
INITIAL NUTRITION ASSESSMENT  DOCUMENTATION CODES Per approved criteria  -Not Applicable   INTERVENTION:  Consider swallow eval with SLP  Recommend diet advancement.  Dysphagia 3 thin liquids or per SLP.  Discussed with RN swallowing concerns and recommended not using a straw.  Paged MD with above  Will order Ensure Complete when diet advanced  RD to follow  NUTRITION DIAGNOSIS: Inadequate oral intake related to clear liquid diet as evidenced by clear liquid diet.   Goal: Tolerate diet advancement with intake of meals and supplements to meet >90% estimated needs.  Monitor:  Intake, labs, weight trend  Reason for Assessment: Consult for assessment of status and needs  53 y.o. female  Admitting Dx: <principal problem not specified>  ASSESSMENT: Patient admitted with chronic nausea.  Hx includes progressive deconditioning, ALS.    Patient reported nausea for the past 3 days.  States prior to this that she ate very well.  UBW 146 lbs- stable.  Tolerated Clear liquid diet well and wants it advanced to her usual Mechanical soft diet.  Wants to try Ensure or Boost.   Asked for a sip of soda.  Provided to patient through a straw.  Patient coughed and stated that this has been happening more frequently with a straw.  Gave a sip from a cup.  She tucked her chin and tolerated this much better.  Height: Ht Readings from Last 1 Encounters:  12/15/13 5\' 4"  (1.626 m)    Weight: Wt Readings from Last 1 Encounters:  12/15/13 148 lb 5.9 oz (67.3 kg)    Ideal Body Weight: 120 lbs  % Ideal Body Weight: 123  Wt Readings from Last 10 Encounters:  12/15/13 148 lb 5.9 oz (67.3 kg)  10/28/13 145 lb (65.772 kg)  10/20/13 139 lb 9.6 oz (63.322 kg)  07/09/13 145 lb (65.772 kg)  07/09/13 145 lb (65.772 kg)  06/29/13 145 lb (65.772 kg)  06/24/13 141 lb (63.957 kg)  04/10/13 148 lb (67.132 kg)  12/31/12 152 lb 8 oz (69.174 kg)  12/26/12 153 lb 12.8 oz (69.763 kg)    Usual Body  Weight: 146  % Usual Body Weight: 101  BMI:  Body mass index is 25.46 kg/(m^2).  Estimated Nutritional Needs: Kcal: 1700-1800 Protein: 65-75 gm Fluid: >1.7L daily  Skin: intact  Diet Order: Clear Liquid  EDUCATION NEEDS: -No education needs identified at this time  No intake or output data in the 24 hours ending 12/16/13 1540   Labs:   Recent Labs Lab 12/15/13 1746 12/16/13 0516  NA 141 145  K 4.2 4.7  CL 103 109  CO2 25 24  BUN 24* 22  CREATININE 1.17* 1.32*  CALCIUM 10.8* 9.9  GLUCOSE 87 116*    CBG (last 3)  No results found for this basename: GLUCAP,  in the last 72 hours  Scheduled Meds: . ARIPiprazole  2 mg Oral QHS  . baclofen  5 mg Oral BID  . calcium carbonate  1 tablet Oral Daily  . dexamethasone  2 mg Oral Daily  . enoxaparin (LOVENOX) injection  40 mg Subcutaneous Q24H  . FLUoxetine  20 mg Oral Daily  . methadone  5 mg Oral Q12H  . ondansetron  4 mg Oral Q6H  . pantoprazole  40 mg Oral Daily  . pregabalin  50 mg Oral BID  . vitamin C  500 mg Oral Daily    Continuous Infusions: . sodium chloride 75 mL/hr at 12/15/13 2149    Past Medical History  Diagnosis  Date  . GERD (gastroesophageal reflux disease)   . Anxiety   . Depression   . MVP (mitral valve prolapse)   . Cervical spondylosis   . 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 Pneumonia   . Constipation     Past Surgical History  Procedure Laterality Date  . Eye surgery    . Breast surgery    . Vagina surgery      mesh  . Kidney donation      Right nephrectomy  . Abdominal hernia repair    . Orif wrist fracture  07/09/2013    DR Melvyn NovasTMANN  . Orif wrist fracture Left 07/09/2013    Procedure: OPEN REDUCTION INTERNAL FIXATION (ORIF) LEFT  WRIST FRACTURE;  Surgeon: Sharma CovertFred W Ortmann, MD;  Location: [facility] OR;  Service: Orthopedics;  Laterality: Left;    Oran ReinLaura Marletta Bousquet, RD, LDN Clinical Inpatient Dietitian Pager:  [phone] Weekend and after hours pager:   [phone]

## 2013-12-16 NOTE — Care Management Note (Signed)
    Page 1 of 1   12/16/2013     2:07:40 PM CARE MANAGEMENT NOTE 12/16/2013  Patient:  [name],Ailyn A   Account Number:  [id number]  Date Initiated:  12/16/2013  Documentation initiated by:  [facility]FF[facility],Ji Fairburn  Subjective/Objective Assessment:   adm: Acute intractable nausea and vomiting  (ALS)     Action/Plan:   SNFvs Home   Anticipated DC Date:  12/17/2013   Anticipated DC Plan:  SKILLED NURSING FACILITY      DC Planning Services  CM consult      Choice offered to / List presented to:             Status of service:  Completed, signed off Medicare Important Message given?   (If response is "NO", the following Medicare IM given date fields will be blank) Date Medicare IM given:   Medicare IM given by:   Date Additional Medicare IM given:   Additional Medicare IM given by:    Discharge Disposition:  SKILLED NURSING FACILITY  Per UR Regulation:    If discussed at Long Length of Stay Meetings, dates discussed:    Comments:  12/16/13 13:25 CM received call from RN stating son, Fuller MandrilMichael Tokunaga, who is sole caretaker would like me to call him. CM went to pt's room to ask permission to speak to her son and pt said, yes.  Cm called Casimiro NeedleMichael who states his mother has declined to the point he cannot adequately care for her and an alternative living situation must be considered.  CM has referred him to CSW, Jeanice LimHolly who states she will call him today.  No other CM needs were communicated.  Freddy JakschSarah Kennice Finnie, BSN, CM [phone].

## 2013-12-16 NOTE — Progress Notes (Signed)
Physical Therapy Treatment Patient Details Name: Katherine Potts MRN: [id number] DOB: 06-Oct-1960 Today's Date: 12/16/2013    History of Present Illness Pt is 53 yo female with chronic nausea, ALS, progressive deconditioning, on multiple medications for nausea including Zofran, phenergan, valium, PPI's, [facility] with main concern of one week duration of worsening nausea and non bloody vomiting, worse compared to her baseline, unable to keep anything down, associated with poor oral intake and malaise    PT Comments      Follow Up Recommendations  Home health PT     Equipment Recommendations  None recommended by PT    Recommendations for Other Services OT consult     Precautions / Restrictions Precautions Precautions: Fall Restrictions Weight Bearing Restrictions: No    Mobility  Bed Mobility Overal bed mobility: Needs Assistance Bed Mobility: Supine to Sit;Sit to Supine     Supine to sit: Mod assist Sit to supine: Mod assist   General bed mobility comments: Assist to manage LEs and to control trunk  Transfers Overall transfer level: Needs assistance Equipment used: None Transfers: Sit to/from Stand;Stand Pivot Transfers Sit to Stand: Min assist;Mod assist Stand pivot transfers: Mod assist       General transfer comment: [facility]  Ambulation/Gait                 Stairs            Wheelchair Mobility    Modified Rankin (Stroke Patients Only)       Balance                                    Cognition Arousal/Alertness: Awake/alert Behavior During Therapy: WFL for tasks assessed/performed Overall Cognitive Status: Within Functional Limits for tasks assessed                      Exercises      General Comments        Pertinent Vitals/Pain Pain Assessment: 0-10 Pain Score: 6  Pain Location: back Pain Descriptors / Indicators: Aching Pain Intervention(s):  Limited activity within patient's tolerance;Monitored during session;Premedicated before session    Home Living                      Prior Function            PT Goals (current goals can now be found in the care plan section) Acute Rehab PT Goals Patient Stated Goal: less pain in back PT Goal Formulation: With patient Time For Goal Achievement: 12/23/13 Potential to Achieve Goals: Fair Progress towards PT goals: Progressing toward goals    Frequency  Min 3X/week    PT Plan Current plan remains appropriate    Co-evaluation             End of Session Equipment Utilized During Treatment: Gait belt Activity Tolerance: Patient tolerated treatment well;Patient limited by pain Patient left: in chair;with call bell/phone within reach     Time: 1310-1327 PT Time Calculation (min): 17 min  Charges:  $Therapeutic Activity: 8-22 mins                    G Codes:      Katherine Potts 12/16/2013, 5:21 PM

## 2013-12-16 NOTE — Progress Notes (Addendum)
Patient ID: Katherine Potts, female   DOB: 1960-12-02, 53 y.o.   MRN: [id number]  [facility] PROGRESS NOTE  Katherine Josephsheresa A Sillas YQM:[id number] DOB: 1960-12-02 DOA: 12/15/2013 PCP: Gretel AcreNNODI, ADAKU, MD  Brief narrative: Pt is 53 yo female with chronic nausea, ALS, progressive deconditioning, on multiple medications for nausea including Zofran, phenergan, valium, PPI's, [facility] with main concern of one week duration of worsening nausea and non bloody vomiting, worse compared to her baseline, unable to keep anything down, associated with poor oral intake and malaise. PT denies fevers, chills, no chest pain or shortness of breath, no blood in urine or stool, no recent sick contacts or exposures.   In ED, pt is hemodynamically stable, VSS, blood work unremarkable. Pt does not feel comfortable going home. [facility] asked to admit for observation.   Assessment and Plan:   Principal Problem: Acute intractable nausea and vomiting   Chronic in nature, CT abd with no specific revealing etiology   Pt is on multiple sedating medications which can decrease motility of the gut significantly and likely contributing to her nausea   She is on phenergan, valium, ativan, Prozac, methadone, Baclofen, oxycodone, lyrica --> ALL CAN CAUSE N/V   Continue to provide supportive care with IVF, analgesia and antiemetics as needed   Advance diet as pt able to tolerate  Active Problems: Anxiety and depression   Continue home medical regimen  Acute on chronic renal failure, stage II   Cr is up from admission, continue IVF and repeat BMP in AM  Advance diet as pt able to tolerate  ALS  Progressive decline, requiring inpatient rehab recently but continues to decline clinically    PT/OT evaluation requested Severe PCM      Secondary to progressive nature of ALS and persistent nausea   Nutrition consultation requested  Anemia of chronic disease  Slight drop in Hg since admission likely  dilutional  Repeat CBC in AM  DVT prophylaxis:  Lovenox subQ   Code Status: Full.  Family Communication:  plan of care discussed with the patient Disposition Plan: Home when stable.    IV Access:   Peripheral IV Procedures and diagnostic studies:   Radiological Exams on Admission:  Ct Abdomen Pelvis W Contrast 12/15/2013  1. No acute inflammatory process within abdomen or pelvis.  2. Surgically absent right kidney. No left hydronephrosis.  3. No pericecal inflammation. Normal appendix.  4. Scattered diverticula are noted sigmoid colon. No evidence of acute diverticulitis.  Dg Chest Portable 1 View 12/15/2013  1. Mild right basilar atelectasis.  Medical Consultants:   None Other Consultants:   PT/OT Nutrition  Anti-Infectives:   None   Manson PasseyEVINE, Keneth Borg, MD  [facility] Pager [phone]  If 7PM-7AM, please contact night-coverage [email or web address] Password [email or web address] 12/16/2013, 9:31 AM   LOS: 1 day    HPI/Subjective: No acute overnight events.  Objective: Filed Vitals:   12/15/13 1842 12/15/13 2109 12/15/13 2132 12/16/13 0552  BP: 130/85 138/85 122/87 108/61  Pulse: 78 92 80 75  Temp: 98.4 F (36.9 C) 98.5 F (36.9 C) 98.3 F (36.8 C) 98.3 F (36.8 C)  TempSrc: Oral Oral Oral Oral  Resp: 18 14 18 16   Height:   5\' 4"  (1.626 m)   Weight:   67.3 kg (148 lb 5.9 oz)   SpO2: 94% 98% 98% 97%   No intake or output data in the 24 hours ending 12/16/13 0931  Exam:   General:  Pt is alert, follows commands appropriately, not in  acute distress; sitting in a chair.  Cardiovascular: Regular rate and rhythm, S1/S2, no murmurs  Respiratory: Clear to auscultation bilaterally, no wheezing, no crackles, no rhonchi  Abdomen: Soft, non tender, non distended, bowel sounds present  Extremities: +1 non pitting edema, pulses DP and PT palpable bilaterally  Neuro: Grossly nonfocal  Data Reviewed: Basic Metabolic Panel:  Recent Labs Lab 12/15/13 1746 12/16/13 0516  NA  141 145  K 4.2 4.7  CL 103 109  CO2 25 24  GLUCOSE 87 116*  BUN 24* 22  CREATININE 1.17* 1.32*  CALCIUM 10.8* 9.9   Liver Function Tests:  Recent Labs Lab 12/15/13 1746  AST 15  ALT 11  ALKPHOS 71  BILITOT 0.2*  PROT 7.2  ALBUMIN 3.4*    Recent Labs Lab 12/15/13 1746  LIPASE 15   CBC:  Recent Labs Lab 12/15/13 1746 12/16/13 0516  WBC 6.4 4.7  NEUTROABS 3.8  --   HGB 11.2* 10.5*  HCT 33.6* 31.4*  MCV 92.8 92.9  PLT 200 179    Scheduled Meds: . ARIPiprazole  2 mg Oral Daily  . baclofen  5 mg Oral BID  . calcium carbonate  1 tablet Oral Daily  . dexamethasone  2 mg Oral Daily  . enoxaparin (LOVENOX) injection  40 mg Subcutaneous Q24H  . FLUoxetine  20 mg Oral Daily  . Influenza vac split quadrivalent PF  0.5 mL Intramuscular Tomorrow-1000  . methadone  5 mg Oral Q12H  . ondansetron  4 mg Oral Q6H  . pantoprazole  40 mg Oral Daily  . pregabalin  50 mg Oral BID  . vitamin C  500 mg Oral Daily   Continuous Infusions: . sodium chloride 75 mL/hr at 12/15/13 2149

## 2013-12-16 NOTE — Progress Notes (Addendum)
Clinical Social Work Department CLINICAL SOCIAL WORK PLACEMENT NOTE 12/16/2013  Patient:  Katherine Potts,Katherine Potts  Account Number:  [id number] Admit date:  12/15/2013  Clinical Social Worker:  Unk LightningHOLLY Jabes Primo, LCSW  Date/time:  12/16/2013 03:30 PM  Clinical Social Work is seeking post-discharge placement for this patient at the following level of care:   SKILLED NURSING   (*CSW will update this form in Epic as items are completed)   12/16/2013  Patient/family provided with [facility] Department of Clinical Social Work's list of facilities offering this level of care within the geographic area requested by the patient (or if unable, by the patient's family).  12/16/2013  Patient/family informed of their freedom to choose among providers that offer the needed level of care, that participate in Medicare, Medicaid or managed care program needed by the patient, have an available bed and are willing to accept the patient.  12/16/2013  Patient/family informed of [facility]' ownership interest in [facility], as well as of the fact that they are under no obligation to receive care at this facility.  PASARR submitted to EDS on 12/16/2013 PASARR number received on 12/16/2013  FL2 transmitted to all facilities in geographic area requested by pt/family on  12/16/2013 FL2 transmitted to all facilities within larger geographic area on   Patient informed that his/her managed care company has contracts with or will negotiate with  certain facilities, including the following:     Patient/family informed of bed offers received:  12/17/13 [facility] Physician recommends and patient chooses bed at    [facility]  [facility]   Patient to be transferred to facility by  Patient and family notified of transfer on  Name of family member notified:    The following physician request were entered in  Epic:   Additional Comments:

## 2013-12-16 NOTE — Progress Notes (Signed)
Clinical Social Work Department BRIEF PSYCHOSOCIAL ASSESSMENT 12/16/2013  Patient:  Katherine Potts, Katherine Potts     Account Number:  [id number]     Admit date:  12/15/2013  Clinical Social Worker:  Earlie Server  Date/Time:  12/16/2013 03:00 PM  Referred by:  Physician  Date Referred:  12/16/2013 Referred for  SNF Placement   Other Referral:   Interview type:  Patient Other interview type:    PSYCHOSOCIAL DATA Living Status:  FAMILY Admitted from facility:   Level of care:   Primary support name:  Legrand Como Primary support relationship to patient:  CHILD, ADULT Degree of support available:   Strong    CURRENT CONCERNS Current Concerns  Post-Acute Placement   Other Concerns:    SOCIAL WORK ASSESSMENT / PLAN CSW received referral in order to assist with DC planning. CSW reviewed chart and met with patient at bedside. CSW introduced myself and explained role.    Patient reports she has been living at home but that she is now requiring more care. Patient reports that she has [facility] but needs more assistance and is interested in SNF. CSW explained SNF process along with Medicare coverage. CSW spoke with patient about LT care but patient reports her plan is to have ST SNF and then return home with son. CSW encouraged patient to apply for Medicaid to assist with LT placement if patient was interested. Patient agreeable to [facility] search and for CSW to contact son.    CSW spoke with son who reports he was a teenager when patient was diagnosed with ALS. Son reports he is 69 and is patient's primary caregiver. Son aware of SNF information and agreeable to placement.    CSW completed FL2 and faxed out. CSW will follow up with bed offers.   Assessment/plan status:  Psychosocial Support/Ongoing Assessment of Needs Other assessment/ plan:   Information/referral to community resources:   SNF list    PATIENT'S/FAMILY'S RESPONSE TO PLAN OF CARE: Patient alert and oriented. Patient reports  she is familiar with healthcare system and is aware of SNF process. Patient agreeable and is hopeful to find somewhere close to home. Patient's son reports he has been trying to manage patient at home but that he is only 52 years old and it is difficult for him to provide all of patient's care. Patient son is aware of insurance coverage and Medicaid as well.       [place], [place] [phone]

## 2013-12-16 NOTE — Evaluation (Signed)
Occupational Therapy Evaluation Patient Details Name: Laurell Josephsheresa A Freimuth MRN: [id number] DOB: 1960-11-25 Today's Date: 12/16/2013    History of Present Illness Pt is 53 yo female with chronic nausea, ALS, progressive deconditioning, on multiple medications for nausea including Zofran, phenergan, valium, PPI's, [facility] with main concern of one week duration of worsening nausea and non bloody vomiting, worse compared to her baseline, unable to keep anything down, associated with poor oral intake and malaise   Clinical Impression   Pt admitted with dehaydration. Pt currently with functional limitations due to the deficits listed below (see OT Problem List). Pt will benefit from skilled OT to increase their safety and independence with ADL and functional mobility for ADL to facilitate discharge to venue listed below.      Follow Up Recommendations  SNF    Equipment Recommendations  None recommended by OT    Recommendations for Other Services   PT     Precautions / Restrictions Precautions Precautions: Fall      Mobility Bed Mobility Overal bed mobility: Needs Assistance             General bed mobility comments: pt in chair  Transfers Overall transfer level: Needs assistance               General transfer comment: pt sitting in chair, but uncomfortable.  Spent much of OT session repositioning and helping pt get comfortable.  Pt is Guineahungary- explained dietician order placed.  Pt is not able hold phone and needs A making calls .           ADL Overall ADL's : Needs assistance/impaired Eating/Feeding: Maximal assistance;Sitting   Grooming: Maximal assistance;Sitting                                 General ADL Comments: Pt very lmiited in ADL activity due to decreased use of BUE/hands.  OT spent majority of OT session repositioning BUE including hands, wrist and fingers.  Fingers in a flexed positions, as well as wrist.  Edema noted in  fingers. Educated pt on proper positioning of fingers.  Spoke with  pt about HHOT  and pt agreed.                 Pertinent Vitals/Pain Pain Assessment: 0-10 Pain Score: 7  Pain Location: back Pain Descriptors / Indicators: Aching Pain Intervention(s): Monitored during session;Repositioned;Patient requesting pain meds-RN notified     Hand Dominance     Extremity/Trunk Assessment Upper Extremity Assessment Upper Extremity Assessment: RUE deficits/detail;LUE deficits/detail RUE Deficits / Details: limited AROM due to ALS.  Decreased functional use BUE. Pt would benefit from post acute OT to address positioning.  Pt may benefit from positioning splints.  will continue to evaluate.           Communication Communication Communication: Expressive difficulties;Other (comment) (hard to understand at times)   Cognition Arousal/Alertness: Awake/alert Behavior During Therapy: WFL for tasks assessed/performed Overall Cognitive Status: Within Functional Limits for tasks assessed                     General Comments   son called during OT session and ask to speak to CM as he cant care for pt anymore            Home Living Family/patient expects to be discharged to:: Private residence Living Arrangements: Children Available Help at Discharge: Family;Available 24 hours/day Type of  Home: House Home Access: Ramped entrance     Home Layout: One level     Bathroom Shower/Tub: Producer, television/film/videoWalk-in shower   Bathroom Toilet: Standard     Home Equipment: Environmental consultantWalker - 2 wheels;Bedside commode;Transport chair;Shower seat;Adaptive equipment;Other (comment) (hoyer lift)          Prior Functioning/Environment Level of Independence: Needs assistance             OT Diagnosis: Generalized weakness;Acute pain   OT Problem List: Decreased strength;Decreased range of motion;Decreased activity tolerance;Pain;Impaired balance (sitting and/or standing)   OT Treatment/Interventions:  Self-care/ADL training;Patient/family education    OT Goals(Current goals can be found in the care plan section) Acute Rehab OT Goals Patient Stated Goal: less pain in back OT Goal Formulation: With patient Time For Goal Achievement: 12/30/13 Potential to Achieve Goals: Good  OT Frequency: Min 2X/week   Barriers to D/C: Decreased caregiver support  son called and he can not care for pt at home- pt is not yet aware of this          End of Session Nurse Communication: Mobility status  Activity Tolerance: Patient limited by pain Patient left: in chair;with call bell/phone within reach;with chair alarm set   Time: 1120-1205 OT Time Calculation (min): 45 min Charges:  OT General Charges $OT Visit: 1 Procedure OT Evaluation $Initial OT Evaluation Tier I: 1 Procedure OT Treatments $Self Care/Home Management : 23-37 mins G-Codes:    Alba CoryEDDING, Rashed Edler D 12/16/2013, 12:31 PM

## 2013-12-17 LAB — CBC
HCT: 29.4 % — ABNORMAL LOW (ref 36.0–46.0)
Hemoglobin: 10.1 g/dL — ABNORMAL LOW (ref 12.0–15.0)
MCH: 31.9 pg (ref 26.0–34.0)
MCHC: 34.4 g/dL (ref 30.0–36.0)
MCV: 92.7 fL (ref 78.0–100.0)
Platelets: 179 10*3/uL (ref 150–400)
RBC: 3.17 MIL/uL — AB (ref 3.87–5.11)
RDW: 12.9 % (ref 11.5–15.5)
WBC: 5.4 10*3/uL (ref 4.0–10.5)

## 2013-12-17 LAB — BASIC METABOLIC PANEL
Anion gap: 11 (ref 5–15)
BUN: 18 mg/dL (ref 6–23)
CHLORIDE: 106 meq/L (ref 96–112)
CO2: 24 meq/L (ref 19–32)
Calcium: 9.2 mg/dL (ref 8.4–10.5)
Creatinine, Ser: 1.5 mg/dL — ABNORMAL HIGH (ref 0.50–1.10)
GFR calc Af Amer: 45 mL/min — ABNORMAL LOW (ref >90)
GFR calc non Af Amer: 39 mL/min — ABNORMAL LOW (ref >90)
GLUCOSE: 135 mg/dL — AB (ref 70–99)
POTASSIUM: 3.5 meq/L — AB (ref 3.7–5.3)
Sodium: 141 mEq/L (ref 137–147)

## 2013-12-17 NOTE — Progress Notes (Addendum)
Patient ID: Laurell Josephsheresa A Gregg, female   DOB: [birth year], 53 y.o.   MRN: [id number]  [facility] PROGRESS NOTE  Laurell Josephsheresa A Ganesh HQI:[id number] DOB: [birth year] DOA: 12/15/2013 PCP: Gretel AcreNNODI, ADAKU, MD  Brief narrative:  Pt is 53 yo female with chronic nausea, ALS, progressive deconditioning, on multiple medications for nausea including Zofran, phenergan, valium, PPI's, [facility] with main concern of one week duration of worsening nausea and non bloody vomiting, worse compared to her baseline, unable to keep anything down, associated with poor oral intake and malaise. PT denies fevers, chills, no chest pain or shortness of breath, no blood in urine or stool, no recent sick contacts or exposures.   In ED, pt is hemodynamically stable, VSS, blood work unremarkable. Pt does not feel comfortable going home. [facility] asked to admit for observation.   Assessment and Plan:  Principal Problem:  Acute intractable nausea and vomiting  Chronic in nature, CT abd with no specific revealing etiology  Pt is on multiple sedating medications which can decrease motility of the gut significantly and ? contribute to nausea  She is on phenergan, Valium, Ativan, Prozac, Methadone, Baclofen, Oxycodone, Lyrica Discussed with pt reasons for avoiding IV analgesia including worsening N/V, constipation, ileus  Will allow only home medical regimen as pt is responding well to conservative management Appreciate nutritionist input, recommendation is to proceed with SLP evaluation given swallowing concerns  Active Problems:  Anxiety and depression  Continue ativan as needed  Acute on chronic renal failure, stage II  Cr is up from admission despite providing IVF, BMP pending this AM  Pt tolerating current dys III thin liquid diet  ALS  Progressive decline, requiring inpatient rehab recently but continues to decline clinically  PT/OT evaluation requested and recommending home health PT continue valium for muscle  spasms as needed per home medical regimen  Severe PCM  Secondary to progressive nature of ALS and persistent nausea, swallowing difficulties  Nutrition consultation requested and recommendation is to proceed with SLP evaluation Added nutritional supplements  Anemia of chronic disease  Slight drop in Hg since admission likely dilutional  CBC pending this AM Repeat CBC in AM  Functional quadriplegia  Secondary to progressive nature of ALS  Appreciate input form PT/OT specialist  HH PT recommended and orders placed DVT prophylaxis:  Lovenox subQ   Code Status: Full.  Family Communication: plan of care discussed with the patient  Disposition Plan: Home when stable.   IV Access:   Peripheral IV Procedures and diagnostic studies:    Ct Abdomen Pelvis W Contrast 12/15/2013 No acute inflammatory process within abdomen or pelvis. Surgically absent right kidney. No left hydronephrosis. No pericecal inflammation. Normal appendix. Scattered diverticula are noted sigmoid colon. No evidence of acute diverticulitis.   Dg Chest Portable 1 View 12/15/2013 Mild right basilar atelectasis.  Medical Consultants:   None Other Consultants:   PT/OT  Nutritionist SLP Anti-Infectives:   None   Manson PasseyEVINE, Roneisha Stern, MD  [facility] Pager [phone]  If 7PM-7AM, please contact night-coverage [email or web address] Password [email or web address] 12/17/2013, 9:52 AM   LOS: 2 days    HPI/Subjective: No acute overnight events.Pt reports feeling better, still poor oral intake.  Objective: Filed Vitals:   12/16/13 0552 12/16/13 1355 12/16/13 2227 12/17/13 0641  BP: 108/61 106/74 121/67 94/56  Pulse: 75 77 84 77  Temp: 98.3 F (36.8 C) 98.4 F (36.9 C) 99.1 F (37.3 C) 98.1 F (36.7 C)  TempSrc: Oral Oral Oral Oral  Resp: 16 18 20  20  Height:      Weight:      SpO2: 97% 98% 97% 95%    Intake/Output Summary (Last 24 hours) at 12/17/13 0952 Last data filed at 12/17/13 [id number]  Gross per 24 hour  Intake 2556.25  ml  Output      0 ml  Net 2556.25 ml    Exam:   General:  Pt is alert, follows commands appropriately, not in acute distress  Cardiovascular: Regular rate and rhythm, S1/S2, no murmurs  Respiratory: Clear to auscultation bilaterally, no wheezing, no crackles, no rhonchi  Abdomen: Soft, non tender, non distended, bowel sounds present  Extremities: mild non pitting edema, pulses DP and PT palpable bilaterally  Neuro: Grossly nonfocal  Data Reviewed: Basic Metabolic Panel:  Recent Labs Lab 12/15/13 1746 12/16/13 0516  NA 141 145  K 4.2 4.7  CL 103 109  CO2 25 24  GLUCOSE 87 116*  BUN 24* 22  CREATININE 1.17* 1.32*  CALCIUM 10.8* 9.9   Liver Function Tests:  Recent Labs Lab 12/15/13 1746  AST 15  ALT 11  ALKPHOS 71  BILITOT 0.2*  PROT 7.2  ALBUMIN 3.4*    Recent Labs Lab 12/15/13 1746  LIPASE 15   CBC:  Recent Labs Lab 12/15/13 1746 12/16/13 0516  WBC 6.4 4.7  NEUTROABS 3.8  --   HGB 11.2* 10.5*  HCT 33.6* 31.4*  MCV 92.8 92.9  PLT 200 179   Scheduled Meds: . ARIPiprazole  2 mg Oral QHS  . baclofen  5 mg Oral BID  . calcium carbonate  1 tablet Oral QHS  . dexamethasone  2 mg Oral Daily  . enoxaparin (LOVENOX) injection  40 mg Subcutaneous Q24H  . FLUoxetine  20 mg Oral Daily  . methadone  5 mg Oral Q12H  . ondansetron  4 mg Oral Q6H  . pantoprazole  40 mg Oral Daily  . pregabalin  50 mg Oral BID  . vitamin C  500 mg Oral Daily   Continuous Infusions: . sodium chloride 75 mL/hr at 12/16/13 1500

## 2013-12-17 NOTE — Progress Notes (Signed)
Occupational Therapy Treatment Patient Details Name: Laurell Josephsheresa A Kuras MRN: [id number] DOB: 03/23/1960 Today's Date: 12/17/2013    History of present illness Pt is 53 yo female with chronic nausea, ALS, progressive deconditioning, on multiple medications for nausea including Zofran, phenergan, valium, PPI's, [facility] with main concern of one week duration of worsening nausea and non bloody vomiting, worse compared to her baseline, unable to keep anything down, associated with poor oral intake and malaise   OT comments  Pt able to feed self with min - mod A with use of adapted equipment.  She is able to operate remote control/nursing call button, and dial phone with use of AE.  Splints fitted and appear to be fitting well.  Splint schedule established and nsg informed/instructed.   Follow Up Recommendations  SNF    Equipment Recommendations  None recommended by OT    Recommendations for Other Services      Precautions / Restrictions Precautions Precautions: Fall Restrictions Weight Bearing Restrictions: No       Mobility Bed Mobility                  Transfers                      Balance                                   ADL   Eating/Feeding: Moderate assistance;With adaptive utensils;Sitting                                     General ADL Comments: Pt provided with 2 universal cuffs.  A pencil eraser end was placed in one, which allowed her to operate her remote control, nursing call button, and dial telephone with supervsion.  Pt, however, requires mod A to hold phone to ear.  She was provided with lidded mug and was able to drink from mug modified independently.  A universal cuff was also provided to be used with utensils and with simulated self feeding, pt able to perform with min - mod A.   RN and CNA were instructed in AE provided and it's use.   Pt also able to instruct caregiver.  Splints were on upon  therapist's entrance and appeared to fit well.  Pt denies pain.  Splint schedule established for nighttime wear.  Sign posted over bed and RN and CNA were instructed. Investigated options for hands free phone, or modifications to phone without success at this time.       Vision                     Perception     Praxis      Cognition   Behavior During Therapy: [facility] for tasks assessed/performed;Anxious (tearful - mood fluctuated) Overall Cognitive Status: Within Functional Limits for tasks assessed                       Extremity/Trunk Assessment               Exercises     Shoulder Instructions       General Comments      Pertinent Vitals/ Pain       Pain Assessment: No/denies pain (no complaint during OT session)  Home Living  Prior Functioning/Environment              Frequency Min 2X/week     Progress Toward Goals  OT Goals(current goals can now be found in the care plan section)  Progress towards OT goals: Progressing toward goals  ADL Goals Pt Will Perform Eating: with mod assist;sitting Pt Will Perform Grooming: with mod assist;sitting Additional ADL Goal #1: Pt will verbalize to caregiver proper positioning of BUE for support of joints and edema control at mod I level Additional ADL Goal #2: Pt will tolerate splint wear bil. UEs  Plan Discharge plan remains appropriate    Co-evaluation                 End of Session Equipment Utilized During Treatment: Other (comment) (AE)   Activity Tolerance Patient tolerated treatment well   Patient Left in chair;with call bell/phone within reach   Nurse Communication Other (comment) (splint schedule and use of AE)        Time: 1531-1610 OT Time Calculation (min): 39 min  Charges:    Jeani Hawkingonarpe, Alwilda Gilland M 12/17/2013, 9:12 PM

## 2013-12-17 NOTE — Telephone Encounter (Deleted)
Auth for lidoderm patches

## 2013-12-17 NOTE — Progress Notes (Signed)
Occupational Therapy Note:  Recommend bil resting hand splints bil. UEs to prevent contracture/deformity and further loss of funciton.  Pt agreeable, spoke with MD and splints ordered.  PROM and soft tissu mobilization performed.  She will benefit from use of adapted equipment to increase independence with self feeding.  Pt intermittently tearful re: SNF placement.   12/17/13 1125  OT Visit Information  Last OT Received On 12/17/13  Assistance Needed +2  History of Present Illness Pt is 53 yo female with chronic nausea, ALS, progressive deconditioning, on multiple medications for nausea including Zofran, phenergan, valium, PPI's, [facility] with main concern of one week duration of worsening nausea and non bloody vomiting, worse compared to her baseline, unable to keep anything down, associated with poor oral intake and malaise  OT Time Calculation  OT Start Time 1136  OT Stop Time 1200  OT Time Calculation (min) 24 min  Precautions  Precautions Fall  Pain Assessment  Pain Assessment Faces  Faces Pain Scale 4  Pain Location back  Pain Descriptors / Indicators Aching  Pain Intervention(s) Monitored during session  Cognition  Arousal/Alertness Awake/alert  Behavior During Therapy WFL for tasks assessed/performed;Anxious (fluctuates throughout session)  Overall Cognitive Status Within Functional Limits for tasks assessed  ADL  General ADL Comments Pt assessed for splinting needs.   Pt with tightness at end range PIP joints both hands.  He has minimal PIP extension (intrinsic minus) and maintains claw position both hands.   PROMO performed bil.UEs.  Spoke with MD and bil. resting hand splints were ordered and to be fitted by [employer].  She will benefit from use of universal cuffs.  She reports [facility].  Pt would benefit from seating assessment, but will defer to ALS clinic.    Exercises  Exercises Hand exercises  Hand Exercises  Digit  Composite Flexion PROM;Right;Left;10 reps;Supine  Composite Extension PROM;Left;Right;Both;10 reps;Supine  Other Exercises  Other Exercises gentle soft tissue mobilization performed bil hands.   OT - End of Session  Activity Tolerance Patient tolerated treatment well  Patient left in bed;with call bell/phone within reach;with nursing/sitter in room  OT Assessment/Plan  OT Plan Discharge plan remains appropriate  OT Frequency Min 2X/week  Follow Up Recommendations SNF  OT Equipment None recommended by OT  OT Goal Progression  Progress towards OT goals (Goals added)  Acute Rehab OT Goals  Patient Stated Goal improved use of hands  OT Goal Formulation With patient  Time For Goal Achievement 12/30/13  Potential to Achieve Goals Good  ADL Goals  Pt Will Perform Eating with mod assist;sitting  Pt Will Perform Grooming with mod assist;sitting  Additional ADL Goal #1 Pt will verbalize to caregiver proper positioning of BUE for support of joints and edema control at mod I level  OT General Charges  $OT Visit 1 Procedure  OT Treatments  $Therapeutic Activity 23-37 mins  Reynolds AmericanWendi Mareena Cavan, OTR/L [phone]

## 2013-12-17 NOTE — Progress Notes (Signed)
Clinical Social Work  CSW met with patient at bedside and provided bed offers. Patient chose [facility] and reports it is close to home. Patient agreeable for CSW to contact her son and update him on DC plans as well. CSW informed Legrand Como [phone]) of DC plans and he is agreeable and requests that [facility] provide transportation. CSW informed SNF of patient's choice and will continue to follow.  [place], [place] [phone]

## 2013-12-18 DIAGNOSIS — F419 Anxiety disorder, unspecified: Secondary | ICD-10-CM | POA: Diagnosis present

## 2013-12-18 DIAGNOSIS — N182 Chronic kidney disease, stage 2 (mild): Secondary | ICD-10-CM | POA: Diagnosis present

## 2013-12-18 DIAGNOSIS — G1221 Amyotrophic lateral sclerosis: Secondary | ICD-10-CM

## 2013-12-18 DIAGNOSIS — E860 Dehydration: Secondary | ICD-10-CM

## 2013-12-18 DIAGNOSIS — E43 Unspecified severe protein-calorie malnutrition: Secondary | ICD-10-CM | POA: Diagnosis present

## 2013-12-18 DIAGNOSIS — R532 Functional quadriplegia: Secondary | ICD-10-CM | POA: Diagnosis present

## 2013-12-18 DIAGNOSIS — R531 Weakness: Secondary | ICD-10-CM

## 2013-12-18 DIAGNOSIS — D638 Anemia in other chronic diseases classified elsewhere: Secondary | ICD-10-CM | POA: Diagnosis present

## 2013-12-18 DIAGNOSIS — G43A1 Cyclical vomiting, intractable: Secondary | ICD-10-CM

## 2013-12-18 LAB — CBC
HCT: 28.8 % — ABNORMAL LOW (ref 36.0–46.0)
HEMOGLOBIN: 9.7 g/dL — AB (ref 12.0–15.0)
MCH: 31.5 pg (ref 26.0–34.0)
MCHC: 33.7 g/dL (ref 30.0–36.0)
MCV: 93.5 fL (ref 78.0–100.0)
Platelets: 175 10*3/uL (ref 150–400)
RBC: 3.08 MIL/uL — ABNORMAL LOW (ref 3.87–5.11)
RDW: 12.9 % (ref 11.5–15.5)
WBC: 6.6 10*3/uL (ref 4.0–10.5)

## 2013-12-18 LAB — BASIC METABOLIC PANEL
Anion gap: 11 (ref 5–15)
BUN: 21 mg/dL (ref 6–23)
CHLORIDE: 105 meq/L (ref 96–112)
CO2: 26 mEq/L (ref 19–32)
CREATININE: 1.51 mg/dL — AB (ref 0.50–1.10)
Calcium: 9.3 mg/dL (ref 8.4–10.5)
GFR, EST AFRICAN AMERICAN: 45 mL/min — AB (ref >90)
GFR, EST NON AFRICAN AMERICAN: 39 mL/min — AB (ref >90)
GLUCOSE: 88 mg/dL (ref 70–99)
POTASSIUM: 4.1 meq/L (ref 3.7–5.3)
Sodium: 142 mEq/L (ref 137–147)

## 2013-12-18 MED ORDER — LORAZEPAM 1 MG PO TABS: 1.0000 mg | ORAL_TABLET | Freq: Three times a day (TID) | ORAL | Status: AC | PRN

## 2013-12-18 MED ORDER — OXYCODONE HCL 10 MG PO TABS: 10.0000 mg | ORAL_TABLET | ORAL | Status: DC | PRN

## 2013-12-18 MED ORDER — METHADONE HCL 5 MG PO TABS: 5.0000 mg | ORAL_TABLET | Freq: Two times a day (BID) | ORAL | Status: DC

## 2013-12-18 MED ORDER — DIAZEPAM 5 MG PO TABS: 2.5000 mg | ORAL_TABLET | Freq: Two times a day (BID) | ORAL | Status: AC | PRN

## 2013-12-18 MED ORDER — LORAZEPAM 1 MG PO TABS: 2.0000 mg | ORAL_TABLET | Freq: Three times a day (TID) | ORAL | Status: DC | PRN

## 2013-12-18 MED ORDER — BACLOFEN 5 MG HALF TABLET: 5.0000 mg | ORAL_TABLET | Freq: Four times a day (QID) | ORAL | Status: DC

## 2013-12-18 NOTE — Evaluation (Signed)
Clinical/Bedside Swallow Evaluation Patient Details  Name: Katherine Potts MRN: [id number] Date of Birth: 09-07-1960  Today's Date: 12/18/2013 Time: 1010-1039 SLP Time Calculation (min): 29 min  Past Medical History:  Past Medical History  Diagnosis Date  . GERD (gastroesophageal reflux disease)   . Anxiety   . Depression   . MVP (mitral valve prolapse)   . Cervical spondylosis   . 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 Pneumonia   . Constipation    Past Surgical History:  Past Surgical History  Procedure Laterality Date  . Eye surgery    . Breast surgery    . Vagina surgery      mesh  . Kidney donation      Right nephrectomy  . Abdominal hernia repair    . Orif wrist fracture  07/09/2013    DR Melvyn NovasTMANN  . Orif wrist fracture Left 07/09/2013    Procedure: OPEN REDUCTION INTERNAL FIXATION (ORIF) LEFT  WRIST FRACTURE;  Surgeon: Sharma CovertFred W Ortmann, MD;  Location: [facility] OR;  Service: Orthopedics;  Laterality: Left;   HPI:  53 [facility] after having n/v at home prior to admission.  Pt diagnosed with ALS in November 2014.  Swallow evaluation ordered due to pt coughing observed by dietician with liquid via straw.  Soft diet ordered with good tolerance.     Assessment / Plan / Recommendation Clinical Impression  Pt presents with mild dysphagia due to her ALS for which she is adequately compensating.   Speech is mildly dysarthric - ? lingual fasciulations - that impacts oral transiting/control.  Pt admits that softer foods are tolerated better and denies choking or sensation of residuals with intake.  Xerostomia also noted by pt, which she attributes to medications.  Observed pt consuming liquid and medication with liquids without symptoms of dysphagia or aspiration.    Pt is a retired Engineer, civil (consulting)nurse and SLP educated her to symptoms of dysphagia, compensation strategies and possible diet modifications that may be indicated in the future as  her ALS progresses.    She inquired re: cough assist for ALS pts and reports [facility]  Recommend continue soft/thin diet with full assistance, no SLP follow up indicated as all education completed.      Aspiration Risk  Mild    Diet Recommendation Dysphagia 3 (Mechanical Soft);Thin liquid   Liquid Administration via: Cup;No straw Medication Administration: Whole meds with liquid (with applesauce or puree if problematic) Supervision: Full supervision/cueing for compensatory strategies;Staff to assist with self feeding Compensations: Slow rate;Small sips/bites Postural Changes and/or Swallow Maneuvers: Seated upright 90 degrees;Upright 30-60 min after meal    Other  Recommendations   n/a  Follow Up Recommendations  None    Frequency and Duration   n/a     Pertinent Vitals/Pain Afebrile, decreased     Swallow Study Prior Functional Status   see HHX    General Date of Onset: 12/18/13 HPI: 53 [facility] after having n/v at home prior to admission.  Pt diagnosed with ALS in November 2014.  Swallow evaluation ordered due to pt coughing observed by dietician with liquid via straw.  Soft diet ordered with good tolerance.   Type of Study: Bedside swallow evaluation Diet Prior to this Study: Dysphagia 3 (soft);Thin liquids Temperature Spikes Noted: No Respiratory Status: Room air History of Recent Intubation: No Behavior/Cognition: Alert;Cooperative;Pleasant mood Oral Cavity - Dentition:  Adequate natural dentition Self-Feeding Abilities: Needs assist Patient Positioning: Upright in bed Baseline Vocal Quality: Clear Volitional Cough: Weak Volitional Swallow: Able to elicit    Oral/Motor/Sensory Function Overall Oral Motor/Sensory Function:  (? lingual fasciulations, dysarthric) Labial Strength: Reduced Lingual Strength: Reduced Velum: Within Functional Limits Mandible: Within Functional Limits   Ice Chips  Ice chips: Not tested   Thin Liquid Thin Liquid: Within functional limits Presentation: Cup;Self Fed    Nectar Thick Nectar Thick Liquid: Not tested   Honey Thick Honey Thick Liquid: Not tested   Puree Puree: Not tested   Solid   GO    Solid: Within functional limits Other Comments: pills whole with liquid       Katherine Burnetamara Zyia Kaneko, MS [facility] SLP [phone]

## 2013-12-18 NOTE — Telephone Encounter (Signed)
Spoke to patient's son Casimiro NeedleMichael, and [facility], and he is going to work with social workers to see if they will then transfer her to a skilled nursing facility.  She can only move her arms and torso now, and is breathing and speaking only.  He relayed that she doesn't qualify for hospice, something about ALS and 6 months.  He made need Dr. Clarisa KindredWillis's input for future, but he relayed it is almost impossible to get her in for an appointment now.

## 2013-12-18 NOTE — Progress Notes (Signed)
Clinical Social Work Department CLINICAL SOCIAL WORK PLACEMENT NOTE 12/18/2013  Patient:  [name],Kamalani A  Account Number:  [id number] Admit date:  12/15/2013  Clinical Social Worker:  Unk LightningHOLLY GERBER, LCSW  Date/time:  12/16/2013 03:30 PM  Clinical Social Work is seeking post-discharge placement for this patient at the following level of care:   SKILLED NURSING   (*CSW will update this form in Epic as items are completed)   12/16/2013  Patient/family provided with [facility] of Clinical Social Work's list of facilities offering this level of care within the geographic area requested by the patient (or if unable, by the patient's family).  12/16/2013  Patient/family informed of their freedom to choose among providers that offer the needed level of care, that participate in Medicare, Medicaid or managed care program needed by the patient, have an available bed and are willing to accept the patient.  12/16/2013  Patient/family informed of [facility]' ownership interest in [facility], as well as of the fact that they are under no obligation to receive care at this facility.  PASARR submitted to EDS on 12/16/2013 PASARR number received on 12/16/2013  FL2 transmitted to all facilities in geographic area requested by pt/family on  12/16/2013 FL2 transmitted to all facilities within larger geographic area on   Patient informed that his/her managed care company has contracts with or will negotiate with  certain facilities, including the following:     Patient/family informed of bed offers received:  12/17/2013 [facility] [place] Physician recommends and patient chooses bed at    [facility] [place] [facility]  12/18/2013 Patient to be transferred to facility by [facility] Patient and family notified of transfer on 12/18/2013 Name of family member notified:  SON  The following physician request were  entered in Epic:   Additional Comments: Pt / son are in agreement with d/c to SNF today. [facility] transport required. NSG reviewed d/c summary, scripts, avs. Scripts included in d/c packet.  Cori RazorJamie Boleslaw Borghi LCSW [phone]

## 2013-12-18 NOTE — Progress Notes (Signed)
Occupational Therapy Treatment Patient Details Name: Laurell Josephsheresa A Swindle MRN: [id number] DOB: [birth year] Today's Date: 12/18/2013    History of present illness Pt is 53 yo female with chronic nausea, ALS, progressive deconditioning, on multiple medications for nausea including Zofran, phenergan, valium, PPI's, [facility] with main concern of one week duration of worsening nausea and non bloody vomiting, worse compared to her baseline, unable to keep anything down, associated with poor oral intake and malaise   OT comments  Pt is very motivated.  Pt with bil UE tightness and fatique.    Follow Up Recommendations  SNF    Equipment Recommendations  None recommended by OT    Recommendations for Other Services      Precautions / Restrictions Precautions Precautions: Fall       Mobility Bed Mobility                  Transfers                      Balance                                   ADL   Eating/Feeding:  (see below)                                     General ADL Comments: Pt wanted nursing to assist her with self feeding this am due to tightness. She is able to drink beverage from lidded cup at mod I level when positioned within reach.   Returned after breakfast and performed PROM/AAROM/mobilization from chair.  Simulated self-feeding with universal cuff after stretching:  pt was able to scoop and bring spoon to mouth with extra effort.  C/O tightening in back when using arm.  May be able to position on pillows to use more elbow motion to decrease energy expenditure and tightness.  Left universal cuff on pt to operate TV control.  Positioned UEs on pillows.  Pt loves resting hand splints:  only L one was on when I first arrived this am.  She is tolerating well, although wrist pulls up from base of splint due to tightness.      Vision                     Perception     Praxis      Cognition   Behavior  During Therapy: [facility] for tasks assessed/performed;Anxious Overall Cognitive Status: Within Functional Limits for tasks assessed                       Extremity/Trunk Assessment               Exercises Other Exercises Other Exercises: PROM/AAROM and gentle stretch mobilization from fingers to shoulders of bil UEs.  LUE tighter throughout including finger flexion.  RUE finger flexion is WFLs.  Pt is in intrinsic minus position at rest      Shoulder Instructions       General Comments      Pertinent Vitals/ Pain       Faces Pain Scale: Hurts little more (initially none; back pain when using UEs) Pain Intervention(s): Monitored during session;Limited activity within patient's tolerance  Home Living  Prior Functioning/Environment              Frequency Min 2X/week     Progress Toward Goals  OT Goals(current goals can now be found in the care plan section)  Progress towards OT goals: Progressing toward goals  Acute Rehab OT Goals Patient Stated Goal: be able to feed myself   Plan      Co-evaluation                 End of Session     Activity Tolerance Patient tolerated treatment well   Patient Left in chair;with call bell/phone within reach   Nurse Communication          Time: [phone] and [phone] OT Time Calculation (min): 34 min  Charges: OT General Charges $OT Visit: 1 Procedure OT Treatments $Self Care/Home Management : 8-22 mins $Therapeutic Activity: 8-22 mins  Jayden Rudge 12/18/2013, 9:59 AM  Marica OtterMaryellen Areli Frary, OTR/L [phone] 12/18/2013

## 2013-12-18 NOTE — Progress Notes (Signed)
[facility]ave [facility]nessa [facility].  Erick Blinksuchman, Kamarii Buren D, RN

## 2013-12-18 NOTE — Discharge Instructions (Signed)
Amyotrophic Lateral Sclerosis Amyotrophic lateral sclerosis (ALS), also called Lou Gehrig disease, is a nervous system disease. ALS causes a gradual loss of the nerve cells (neurons) that move your voluntary muscles. Voluntary muscles are the muscles you can control, such as the muscles in your arms, legs, and lungs. In ALS, the loss of neurons causes the muscles to eventually stop working and waste away (atrophy). ALS does not affect the muscles that control your heart, digestive system, bladder, or bowels. ALS cannot be passed from one person to another. There is no cure for ALS, but treatment can help you live longer and improve your quality of life. CAUSES  The cause of most cases of ALS is not known. A small number of cases are caused by a gene. RISK FACTORS  Having a family history of the disease. A very small number of people with ALS have a family history of the disease. Your risk of developing ALS may be increased if you inherit genes for the disorder.  Being a man. Men are affected slightly more than women.  Being between the age of 60 and 69. Anyone at any age can get ALS, but people are at greatest risk between these ages. SIGNS AND SYMPTOMS  Signs and symptoms of ALS start slowly. The first symptoms may include:   Cramps.  Muscle twitches.  Clumsiness.  Stiffness. Over time, signs and symptoms become more noticeable. These may include:  Inability to walk.  Slurred speech.  Trouble swallowing.  Inability to move arms or legs.  Muscle atrophy.  Involuntary laughing or crying.  Trouble breathing.  Anxiety and depression. DIAGNOSIS  Your health care provider may diagnose ALS based on your symptoms and a physical exam. Your health care provider may also do some tests to help make a diagnosis. These may include:   Nerve conduction studies. These record muscle activity and check how well your muscle nerves send signals.  Genetic testing. You may have this test to  check for a genetic cause that may require genetic counseling.  Imaging studies of the brain and spinal cord, such as an MRI or CT scan. You may have these to rule out other causes for your symptoms. You may be diagnosed with ALS if your signs and symptoms get worse over time and involve both the neurons in the brain (upper neurons) and those in the spinal cord (lower neurons).  TREATMENT  There is no cure for ALS, but treatment can slow down the progression of the disease and improve your quality of life. Treatment may include:  Medicines. These may be taken to:  Reduce damage to neurons.  Reduce muscle cramps or spasms.  Relieve anxiety or depression.  Occupational and physical therapy to improve quality of life.  Speech therapy to improve speech and the ability to swallow.  Devices to help you breathe more easily. These may include:  Intermittent or positive pressure ventilation.  A diaphragm pacing system. You may need to work with a team of health care providers that includes doctors, therapists, and nutrition specialists. Together you will come up with a plan for home care to meet your needs. These needs may change over time.  HOME CARE INSTRUCTIONS  Do not smoke.  Only take medicines as directed by your health care provider.  Eat small meals often. Work with a nutrition specialist to maintain a healthy diet.  Exercise daily if you are able. Work with a physical therapist to come up with an exercise program that includes stretching   and range-of-motion exercises. Aerobic exercises like swimming or walking can help strengthen muscles that are not affected by ALS.  Make your home safe and easy for you to get around.  An occupational therapist can show you how to use ramps, braces, or a walker.  These devices can help you conserve energy.  Make sure you have a strong support system at home.  Work with a mental health caregiver if you are struggling with symptoms of  anxiety or depression.  Work with a social worker to get the support you need. Your needs may change over time. Work with your health care providers to make sure your needs are being met. SEEK MEDICAL CARE IF:  You have a fever.  You are struggling with any part of home care.  You are struggling with anxiety, depression, or lack of support at home. SEEK IMMEDIATE MEDICAL CARE IF:  You have a fever for more than 3 days.  You cannot swallow food or liquids.  You choke on foods or liquids.  You have chest pain.  You have trouble breathing. MAKE SURE YOU:  Understand these instructions.  Will watch your condition.  Will get help right away if you are not doing well or get worse. Document Released: 02/06/2001 Document Revised: 02/17/2013 Document Reviewed: 01/26/2013 ExitCare Patient Information 2015 [employer]. This information is not intended to replace advice given to you by your health care provider. Make sure you discuss any questions you have with your health care provider.  

## 2013-12-18 NOTE — Discharge Summary (Signed)
Physician Discharge Summary  Katherine Potts ZOX:[id number] DOB: [birth year] DOA: 12/15/2013  PCP: Gretel AcreNNODI, ADAKU, MD  Admit date: 12/15/2013 Discharge date: 12/18/2013   Recommendations for Outpatient Follow-Up:   1. The patient [facility]. 2. Please ensure she has an air mattress overlay to prevent skin breakdown. 3. Repeat BMET in 2-3 days to follow up on elevated creatinine.   Discharge Diagnosis:   Principal Problem:    Nausea and vomiting Active Problems:    GERD (gastroesophageal reflux disease)    Depression    ALS (amyotrophic lateral sclerosis)    Weakness generalized    Dehydration    Anxiety    Functional quadriplegia    Severe protein-calorie malnutrition    CKD (chronic kidney disease), stage II    Anemia of chronic disease   Discharge Condition: Stable.  Diet recommendation: Dysphagia 3.  Regular.   History of Present Illness:   Pt is 53 yo female with chronic nausea, ALS, progressive deconditioning, on multiple medications for nausea including Zofran, phenergan, valium, PPI's, [facility] with main concern of one week duration of worsening nausea and non bloody vomiting, worse compared to her baseline, unable to keep anything down, associated with poor oral intake and malaise. PT denies fevers, chills, no chest pain or shortness of breath, no blood in urine or stool, no recent sick contacts or exposures.   Hospital Course by Problem:   Principal Problem:  Acute intractable nausea and vomiting   Chronic in nature, CT abd with no specific revealing etiology   Pt is on multiple sedating medications which can decrease motility of the gut significantly and ? contribute to nausea   Resolved with supportive care  Active Problems:  Anxiety and depression   Continue Abilify and ativan as needed   Acute on chronic renal failure, stage II   Cr is up from admission despite providing  IVF  Clinically euvolemic, recommend close follow up.  ALS   Progressive decline, requiring inpatient rehab recently but continues to decline clinically   SNF for rehab planned  Continue valium and baclofen for muscle spasms as needed per home medical regimen   Severe PCM   Secondary to progressive nature of ALS and persistent nausea, swallowing difficulties   Continue nutritional supplements  Anemia of chronic disease   Slight drop in Hg since admission likely dilutional   Functional quadriplegia   Secondary to progressive nature of ALS   For SNF placement   Medical Consultants:    None.   Discharge Exam:   Filed Vitals:   12/18/13 0948  BP: 91/60  Pulse: 62  Temp: 98.2 F (36.8 C)  Resp: 18   Filed Vitals:   12/17/13 1416 12/17/13 2040 12/18/13 0518 12/18/13 0948  BP: 95/52 115/65 116/71 91/60  Pulse: 89 66 70 62  Temp: 98.2 F (36.8 C) 98.3 F (36.8 C) 97.8 F (36.6 C) 98.2 F (36.8 C)  TempSrc: Oral Oral Oral Oral  Resp: 18 18 18 18   Height:      Weight:      SpO2: 95% 95% 97% 97%    Gen:  NAD Cardiovascular:  RRR, No M/R/G Respiratory: Lungs CTAB Gastrointestinal: Abdomen soft, NT/ND with normal active bowel sounds. Extremities: No C/E/C   The results of significant diagnostics from this hospitalization (including imaging, microbiology, ancillary and laboratory) are listed below for reference.     Procedures and Diagnostic Studies:   Ct Abdomen Pelvis W Contrast 12/15/2013  No acute inflammatory process within abdomen or pelvis. Surgically absent right kidney. No left hydronephrosis. No pericecal inflammation. Normal appendix. Scattered diverticula are noted sigmoid colon. No evidence of acute diverticulitis.   Dg Chest Portable 1 View 12/15/2013 Mild right basilar atelectasis.     Labs:   Basic Metabolic Panel:  Recent Labs Lab 12/15/13 1746 12/16/13 0516 12/17/13 1055 12/18/13 0423  NA 141 145 141 142  K 4.2 4.7 3.5* 4.1   CL 103 109 106 105  CO2 25 24 24 26   GLUCOSE 87 116* 135* 88  BUN 24* 22 18 21   CREATININE 1.17* 1.32* 1.50* 1.51*  CALCIUM 10.8* 9.9 9.2 9.3   GFR Estimated Creatinine Clearance: 41.1 ml/min (by C-G formula based on Cr of 1.51). Liver Function Tests:  Recent Labs Lab 12/15/13 1746  AST 15  ALT 11  ALKPHOS 71  BILITOT 0.2*  PROT 7.2  ALBUMIN 3.4*    Recent Labs Lab 12/15/13 1746  LIPASE 15   CBC:  Recent Labs Lab 12/15/13 1746 12/16/13 0516 12/17/13 1055 12/18/13 0423  WBC 6.4 4.7 5.4 6.6  NEUTROABS 3.8  --   --   --   HGB 11.2* 10.5* 10.1* 9.7*  HCT 33.6* 31.4* 29.4* 28.8*  MCV 92.8 92.9 92.7 93.5  PLT 200 179 179 175     Discharge Instructions:       Discharge Instructions   Call MD for:  extreme fatigue    Complete by:  As directed      Call MD for:  persistant nausea and vomiting    Complete by:  As directed      Call MD for:  temperature >100.4    Complete by:  As directed      Diet general    Complete by:  As directed   Dysphagia 3, no straws.     Discharge instructions    Complete by:  As directed   You were cared for by Dr. Hillery Aldohristina Sarahanne Novakowski  (a hospitalist) during your hospital stay. If you have any questions about your discharge medications or the care you received while you were in the hospital after you are discharged, you can call the unit and ask to speak with the hospitalist on call if the hospitalist that took care of you is not available. Once you are discharged, your primary care physician will handle any further medical issues. Please note that NO REFILLS for any discharge medications will be authorized once you are discharged, as it is imperative that you return to your primary care physician (or establish a relationship with a primary care physician if you do not have one) for your aftercare needs so that they can reassess your need for medications and monitor your lab values.  Any outstanding tests can be reviewed by your PCP at your  follow up visit.  It is also important to review any medicine changes with your PCP.  Please bring these d/c instructions with you to your next visit so your physician can review these changes with you.     Increase activity slowly    Complete by:  As directed      Walk with assistance    Complete by:  As directed      Walker     Complete by:  As directed             Medication List    STOP taking these medications       pregabalin 50 MG capsule  Commonly  known as:  LYRICA      TAKE these medications       ARIPiprazole 2 MG tablet  Commonly known as:  ABILIFY  Take 1 tablet (2 mg total) by mouth daily.     baclofen 5 mg Tabs tablet  Commonly known as:  LIORESAL  Take 0.5 tablets (5 mg total) by mouth 4 (four) times daily.     calcium carbonate 500 MG chewable tablet  Commonly known as:  TUMS - dosed in mg elemental calcium  Chew 1 tablet by mouth daily.     dexamethasone 2 MG tablet  Commonly known as:  DECADRON  Take 1 tablet (2 mg total) by mouth daily.     diazepam 5 MG tablet  Commonly known as:  VALIUM  Take 0.5 tablets (2.5 mg total) by mouth every 12 (twelve) hours as needed for muscle spasms.     esomeprazole 40 MG capsule  Commonly known as:  NEXIUM  Take 1 capsule (40 mg total) by mouth 2 (two) times daily.     FLUoxetine 20 MG capsule  Commonly known as:  PROZAC  Take 1 capsule (20 mg total) by mouth daily.     lidocaine 5 %  Commonly known as:  LIDODERM  Place 2 patches onto the skin daily. Remove & Discard patch within 12 hours or as directed by MD     LORazepam 1 MG tablet  Commonly known as:  ATIVAN  Take 1-2 tablets (1-2 mg total) by mouth 3 (three) times daily as needed for anxiety.     methadone 5 MG tablet  Commonly known as:  DOLOPHINE  Take 1 tablet (5 mg total) by mouth every 12 (twelve) hours.     ondansetron 4 MG tablet  Commonly known as:  ZOFRAN  Take 1 tablet (4 mg total) by mouth every 6 (six) hours.     Oxycodone HCl 10 MG  Tabs  Take 1 tablet (10 mg total) by mouth every 4 (four) hours as needed.     promethazine 25 MG tablet  Commonly known as:  PHENERGAN  Take 25 mg by mouth every 6 (six) hours as needed for nausea or vomiting.     vitamin C 500 MG tablet  Commonly known as:  ASCORBIC ACID  Take 1 tablet (500 mg total) by mouth daily.          Time coordinating discharge: 35 minutes.  Signed:  Ziair Penson  Pager [phone] [facility] 12/18/2013, 12:31 PM

## 2013-12-18 NOTE — Progress Notes (Signed)
[facility] if pt is ready for d/c.   Cori RazorJamie Charley Miske LCSW [phone]

## 2013-12-19 NOTE — Telephone Encounter (Signed)
Noted, she may not be a hospice candidate if she is going to an ECF.

## 2013-12-21 ENCOUNTER — Other Ambulatory Visit: Payer: Self-pay | Admitting: *Deleted

## 2013-12-21 MED ORDER — OXYCODONE HCL 10 MG PO TABS: 10.0000 mg | ORAL_TABLET | ORAL | Status: DC | PRN

## 2013-12-21 MED ORDER — OXYCODONE HCL 10 MG PO TABS: 10.0000 mg | ORAL_TABLET | ORAL | Status: AC | PRN

## 2013-12-21 MED ORDER — METHADONE HCL 5 MG PO TABS: 5.0000 mg | ORAL_TABLET | Freq: Two times a day (BID) | ORAL | Status: AC

## 2013-12-21 NOTE — Telephone Encounter (Signed)
[facility]

## 2013-12-21 NOTE — Telephone Encounter (Signed)
[facility] 

## 2013-12-22 ENCOUNTER — Non-Acute Institutional Stay (SKILLED_NURSING_FACILITY): Payer: Medicare Other | Admitting: Internal Medicine

## 2013-12-22 DIAGNOSIS — N182 Chronic kidney disease, stage 2 (mild): Secondary | ICD-10-CM

## 2013-12-22 DIAGNOSIS — R1110 Vomiting, unspecified: Secondary | ICD-10-CM

## 2013-12-22 DIAGNOSIS — F329 Major depressive disorder, single episode, unspecified: Secondary | ICD-10-CM

## 2013-12-22 DIAGNOSIS — E43 Unspecified severe protein-calorie malnutrition: Secondary | ICD-10-CM

## 2013-12-22 DIAGNOSIS — F03918 Unspecified dementia, unspecified severity, with other behavioral disturbance: Secondary | ICD-10-CM

## 2013-12-22 DIAGNOSIS — G1221 Amyotrophic lateral sclerosis: Secondary | ICD-10-CM

## 2013-12-22 DIAGNOSIS — R532 Functional quadriplegia: Secondary | ICD-10-CM

## 2013-12-22 DIAGNOSIS — F0391 Unspecified dementia with behavioral disturbance: Secondary | ICD-10-CM

## 2013-12-22 DIAGNOSIS — F32A Depression, unspecified: Secondary | ICD-10-CM

## 2013-12-22 NOTE — Progress Notes (Signed)
MRN: [id number] Name: Katherine Potts  Sex: female Age: 53 y.o. DOB: March 07, 1960  PSC #: Ronni Rumble Facility/Room:111B Level Of Care: SNF Provider: Merrilee Seashore D Emergency Contacts: Extended Emergency Contact Information Primary Emergency Contact: Wilson,William  [place] Home Phone: [phone] Relation: Friend Secondary Emergency Contact: Sondra Come [place] Home Phone: [phone] Relation: Son  Code Status:   Allergies: Bactrim and Ciprofloxacin  Chief Complaint  Patient presents with  . New Admit To SNF    HPI: Patient is 53 y.o. 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  Past Medical History  Diagnosis Date  . GERD (gastroesophageal reflux disease)   . Anxiety   . Depression   . MVP (mitral valve prolapse)   . Cervical spondylosis   . 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 Pneumonia   . Constipation     Past Surgical History  Procedure Laterality Date  . Eye surgery    . Breast surgery    . Vagina surgery      mesh  . Kidney donation      Right nephrectomy  . Abdominal hernia repair    . Orif wrist fracture  07/09/2013    DR Melvyn Novas  . Orif wrist fracture Left 07/09/2013    Procedure: OPEN REDUCTION INTERNAL FIXATION (ORIF) LEFT  WRIST FRACTURE;  Surgeon: Sharma Covert, MD;  Location: [facility] OR;  Service: Orthopedics;  Laterality: Left;      Medication List       This list is accurate as of: 12/22/13 11:59 PM.  Always use your most recent med list.               ARIPiprazole 2 MG tablet  Commonly known as:  ABILIFY  Take 1 tablet (2 mg total) by mouth daily.     baclofen 5 mg Tabs tablet  Commonly known as:  LIORESAL  Take 0.5 tablets (5 mg total) by mouth 4 (four) times daily.     calcium carbonate 500 MG chewable tablet  Commonly known as:  TUMS - dosed in mg elemental calcium  Chew 1 tablet by mouth  daily.     dexamethasone 2 MG tablet  Commonly known as:  DECADRON  Take 1 tablet (2 mg total) by mouth daily.     diazepam 5 MG tablet  Commonly known as:  VALIUM  Take 0.5 tablets (2.5 mg total) by mouth every 12 (twelve) hours as needed for muscle spasms.     esomeprazole 40 MG capsule  Commonly known as:  NEXIUM  Take 1 capsule (40 mg total) by mouth 2 (two) times daily.     FLUoxetine 20 MG capsule  Commonly known as:  PROZAC  Take 1 capsule (20 mg total) by mouth daily.     lidocaine 5 %  Commonly known as:  LIDODERM  Place 2 patches onto the skin daily. Remove & Discard patch within 12 hours or as directed by MD     LORazepam 1 MG tablet  Commonly known as:  ATIVAN  Take 1-2 tablets (1-2 mg total) by mouth 3 (three) times daily as needed for anxiety.     methadone 5 MG tablet  Commonly known as:  DOLOPHINE  Take 1 tablet (5 mg total) by mouth every 12 (twelve) hours.     ondansetron 4 MG tablet  Commonly known as:  ZOFRAN  Take 1 tablet (4 mg total) by  mouth every 6 (six) hours.     Oxycodone HCl 10 MG Tabs  Take 1 tablet (10 mg total) by mouth every 4 (four) hours as needed. For pain     promethazine 25 MG tablet  Commonly known as:  PHENERGAN  Take 25 mg by mouth every 6 (six) hours as needed for nausea or vomiting.     vitamin C 500 MG tablet  Commonly known as:  ASCORBIC ACID  Take 1 tablet (500 mg total) by mouth daily.        No orders of the defined types were placed in this encounter.    Immunization History  Administered Date(s) Administered  . Influenza,inj,Quad PF,36+ Mos 12/16/2013    History  Substance Use Topics  . Smoking status: Former Smoker -- 0.40 packs/day    Types: Cigarettes, Cigars  . Smokeless tobacco: Never Used  . Alcohol Use: No     Comment: none    Family history is noncontributory    Review of Systems  UTO 2/2 pt dementia; nursing voiced many concerns last night over phone, pt was screaming-in pain? Anxiety?,  better today    Filed Vitals:   12/22/13 2136  BP: 112/70  Pulse: 60  Temp: 97.1 F (36.2 C)  Resp: 18    Physical Exam  GENERAL APPEARANCE: Alert, nonconversant,  No acute distress.  SKIN: No diaphoresis rash HEAD: Normocephalic, atraumatic  EYES: Conjunctiva/lids clear. Pupils round, reactive. EOMs intact.  EARS: External exam WNL, canals clear. Hearing grossly normal.  NOSE: No deformity or discharge.  MOUTH/THROAT: Lips w/o lesions  RESPIRATORY: Breathing is even, unlabored. Lung sounds are clear   CARDIOVASCULAR: Heart RRR no murmurs, rubs or gallops. No peripheral edema.   GASTROINTESTINAL: Abdomen is soft, non-tender, not distended w/ normal bowel sounds. GENITOURINARY: Bladder non tender, not distended  MUSCULOSKELETAL: diffuse contractures NEUROLOGIC:  Cranial nerves 2-12 grossly intact; quadriplegia  PSYCHIATRIC: dementia obvious, but not advertised, behavoirs that have quieted some with fentanyl, some with Ativan  Patient Active Problem List   Diagnosis Date Noted  . Functional quadriplegia 12/18/2013  . Severe protein-calorie malnutrition 12/18/2013  . CKD (chronic kidney disease), stage II 12/18/2013  . Anemia of chronic disease 12/18/2013  . Anxiety   . Dehydration 12/15/2013  . Nausea and vomiting 12/15/2013  . Palliative care encounter 11/02/2013  . Weakness generalized 11/02/2013  . Closed fracture of left distal radius 07/09/2013  . 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 Weakness of hand 12/31/2012  . Incisional hernia, without obstruction or gangrene 07/02/2012  . GERD (gastroesophageal reflux disease) 03/06/2012  . Depression 03/06/2012    CBC    Component Value Date/Time   WBC 6.6 12/18/2013 0423   WBC 8.7 06/24/2013 1240   RBC 3.08* 12/18/2013 0423   RBC 4.13 06/24/2013 1240   HGB 9.7* 12/18/2013 0423   HCT 28.8* 12/18/2013 0423   PLT 175 12/18/2013 0423   MCV 93.5 12/18/2013 0423   LYMPHSABS 2.1 12/15/2013 1746   LYMPHSABS  2.0 06/24/2013 1240   MONOABS 0.3 12/15/2013 1746   EOSABS 0.2 12/15/2013 1746   EOSABS 0.1 06/24/2013 1240   BASOSABS 0.0 12/15/2013 1746   BASOSABS 0.0 06/24/2013 1240    CMP     Component Value Date/Time   NA 142 12/18/2013 0423   NA 141 06/24/2013 1240   K 4.1 12/18/2013 0423   CL 105 12/18/2013 0423   CO2 26 12/18/2013 0423   GLUCOSE 88 12/18/2013 0423   GLUCOSE 91 06/24/2013 1240  BUN 21 12/18/2013 0423   BUN 15 06/24/2013 1240   CREATININE 1.51* 12/18/2013 0423   CALCIUM 9.3 12/18/2013 0423   PROT 7.2 12/15/2013 1746   PROT 6.5 06/24/2013 1240   ALBUMIN 3.4* 12/15/2013 1746   AST 15 12/15/2013 1746   ALT 11 12/15/2013 1746   ALKPHOS 71 12/15/2013 1746   BILITOT 0.2* 12/15/2013 1746   GFRNONAA 39* 12/18/2013 0423   GFRAA 45* 12/18/2013 0423    Assessment and Plan  Nausea and vomiting  Chronic in nature, CT abd with no specific revealing etiology   Pt is on multiple sedating medications which can decrease motility of the gut significantly and ? contribute to nausea   Resolved with supportive care  Depression  Continue Abilify and ativan as needed  CKD (chronic kidney disease), stage II  Cr is up from admission despite providing IVF  Clinically euvolemic, recommend close follow up  ALS (amyotrophic lateral sclerosis)  Progressive decline, requiring inpatient rehab recently but continues to decline clinically   SNF for rehab planned  Continue valium and baclofen for muscle spasms as needed per home medical regimen  Severe protein-calorie malnutrition  Secondary to progressive nature of ALS and persistent nausea, swallowing difficulties   Continue nutritional supplements  Functional quadriplegia  Secondary to progressive nature of ALS   For SNF placement    Margit HanksALEXANDER, Deanza Upperman D, MD

## 2013-12-24 ENCOUNTER — Other Ambulatory Visit: Payer: Self-pay | Admitting: *Deleted

## 2013-12-24 MED ORDER — LORAZEPAM 2 MG PO TABS: ORAL_TABLET | ORAL | Status: DC

## 2013-12-24 MED ORDER — FENTANYL 25 MCG/HR TD PT72: 25.0000 ug | MEDICATED_PATCH | TRANSDERMAL | Status: DC

## 2013-12-24 NOTE — Telephone Encounter (Signed)
[facility] 

## 2013-12-27 ENCOUNTER — Encounter: Payer: Self-pay | Admitting: Internal Medicine

## 2013-12-27 DIAGNOSIS — F03918 Unspecified dementia, unspecified severity, with other behavioral disturbance: Secondary | ICD-10-CM | POA: Insufficient documentation

## 2013-12-27 DIAGNOSIS — F0391 Unspecified dementia with behavioral disturbance: Secondary | ICD-10-CM | POA: Insufficient documentation

## 2013-12-27 NOTE — Assessment & Plan Note (Signed)
   Secondary to progressive nature of ALS and persistent nausea, swallowing difficulties   Continue nutritional supplements

## 2013-12-27 NOTE — Assessment & Plan Note (Signed)
Reported that pt screamed and screamed first night in SNF, slighly better after increased pain med, huge doses of anxiety meds given and lyrica was restarted because that was the only med she had been on that was d/c, therefore thought to be part of the change in psych status; Psych is being consulted.

## 2013-12-27 NOTE — Assessment & Plan Note (Signed)
   Chronic in nature, CT abd with no specific revealing etiology   Pt is on multiple sedating medications which can decrease motility of the gut significantly and ? contribute to nausea   Resolved with supportive care

## 2013-12-27 NOTE — Assessment & Plan Note (Signed)
   Progressive decline, requiring inpatient rehab recently but continues to decline clinically   SNF for rehab planned  Continue valium and baclofen for muscle spasms as needed per home medical regimen

## 2013-12-27 NOTE — Assessment & Plan Note (Signed)
   Continue Abilify and ativan as needed

## 2013-12-27 NOTE — Assessment & Plan Note (Signed)
   Cr is up from admission despite providing IVF  Clinically euvolemic, recommend close follow up

## 2013-12-27 NOTE — Assessment & Plan Note (Signed)
   Secondary to progressive nature of ALS   For SNF placement

## 2014-01-03 ENCOUNTER — Non-Acute Institutional Stay (SKILLED_NURSING_FACILITY): Payer: Medicare Other | Admitting: Internal Medicine

## 2014-01-03 DIAGNOSIS — G1221 Amyotrophic lateral sclerosis: Secondary | ICD-10-CM

## 2014-01-03 DIAGNOSIS — R062 Wheezing: Secondary | ICD-10-CM

## 2014-01-03 DIAGNOSIS — F419 Anxiety disorder, unspecified: Secondary | ICD-10-CM

## 2014-01-03 DIAGNOSIS — K5901 Slow transit constipation: Secondary | ICD-10-CM

## 2014-01-03 NOTE — Progress Notes (Signed)
MRN: [id number] Name: Katherine Potts  Sex: female Age: 53 y.o. DOB: 01/11/1961  PSC #: Ronni Rumble Facility/Room: 111 Level Of Care: SNF Provider: Merrilee Seashore D Emergency Contacts: Extended Emergency Contact Information Primary Emergency Contact: Wilson,William  [place] Home Phone: [phone] Relation: Friend Secondary Emergency Contact: Sondra Come [place] Home Phone: [phone] Relation: Son     Allergies: Bactrim and Ciprofloxacin  Chief Complaint  Patient presents with  . Medical Management of Chronic Issues    HPI: Patient is 53 y.o. female who wanted to see me to discuss a list of  concerns.  Past Medical History  Diagnosis Date  . GERD (gastroesophageal reflux disease)   . Anxiety   . Depression   . MVP (mitral valve prolapse)   . Cervical spondylosis   . 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 Pneumonia   . Constipation   . Wheezing     Past Surgical History  Procedure Laterality Date  . Eye surgery    . Breast surgery    . Vagina surgery      mesh  . Kidney donation      Right nephrectomy  . Abdominal hernia repair    . Orif wrist fracture  07/09/2013    DR Melvyn Novas  . Orif wrist fracture Left 07/09/2013    Procedure: OPEN REDUCTION INTERNAL FIXATION (ORIF) LEFT  WRIST FRACTURE;  Surgeon: Sharma Covert, MD;  Location: [facility] OR;  Service: Orthopedics;  Laterality: Left;      Medication List       This list is accurate as of: 01/03/14 11:59 PM.  Always use your most recent med list.               ARIPiprazole 2 MG tablet  Commonly known as:  ABILIFY  Take 1 tablet (2 mg total) by mouth daily.     baclofen 5 mg Tabs tablet  Commonly known as:  LIORESAL  Take 0.5 tablets (5 mg total) by mouth 4 (four) times daily.     calcium carbonate 500 MG chewable tablet  Commonly known as:  TUMS - dosed in mg elemental calcium  Chew 1 tablet by mouth daily.     dexamethasone 2 MG tablet  Commonly known as:  DECADRON  Take 1 tablet (2 mg total) by mouth daily.     diazepam 5 MG tablet  Commonly known as:  VALIUM  Take 0.5 tablets (2.5 mg total) by mouth every 12 (twelve) hours as needed for muscle spasms.     esomeprazole 40 MG capsule  Commonly known as:  NEXIUM  Take 1 capsule (40 mg total) by mouth 2 (two) times daily.     fentaNYL 25 MCG/HR patch  Commonly known as:  DURAGESIC - dosed mcg/hr  Place 1 patch (25 mcg total) onto the skin every 3 (three) days.     FLUoxetine 20 MG capsule  Commonly known as:  PROZAC  Take 1 capsule (20 mg total) by mouth daily.     lidocaine 5 %  Commonly known as:  LIDODERM  Place 2 patches onto the skin daily. Remove & Discard patch within 12 hours or as directed by MD     LORazepam 1 MG tablet  Commonly known as:  ATIVAN  Take 1-2 tablets (1-2 mg total) by mouth 3 (three) times daily as needed for anxiety.     methadone 5 MG tablet  Commonly known as:  DOLOPHINE  Take  1 tablet (5 mg total) by mouth every 12 (twelve) hours.     ondansetron 4 MG tablet  Commonly known as:  ZOFRAN  Take 1 tablet (4 mg total) by mouth every 6 (six) hours.     Oxycodone HCl 10 MG Tabs  Take 1 tablet (10 mg total) by mouth every 4 (four) hours as needed. For pain     promethazine 25 MG tablet  Commonly known as:  PHENERGAN  Take 25 mg by mouth every 6 (six) hours as needed for nausea or vomiting.     vitamin C 500 MG tablet  Commonly known as:  ASCORBIC ACID  Take 1 tablet (500 mg total) by mouth daily.        No orders of the defined types were placed in this encounter.    Immunization History  Administered Date(s) Administered  . Influenza,inj,Quad PF,36+ Mos 12/16/2013    History  Substance Use Topics  . Smoking status: Former Smoker -- 0.40 packs/day    Types: Cigarettes, Cigars  . Smokeless tobacco: Never Used  . Alcohol Use: No     Comment: none    Review of Systems  DATA OBTAINED: from  patient GENERAL:  no fevers, fatigue, appetite changes SKIN: No itching, rash HEENT: No complaint RESPIRATORY: No cough, some wheezing, SOB CARDIAC: No chest pain, palpitations, lower extremity edema  GI: No abdominal pain, No N/V/D;+ constipation, No heartburn or reflux  GU: No dysuria, frequency or urgency, or incontinence  MUSCULOSKELETAL: No unrelieved bone/joint pain NEUROLOGIC: No headache, dizziness  PSYCHIATRIC: anxiety  Filed Vitals:   01/03/14 2121  BP: 112/78  Pulse: 60  Temp: 97.1 F (36.2 C)  Resp: 18    Physical Exam  GENERAL APPEARANCE: Alert, conversant, No acute distress SKIN: No diaphoresis rash HEENT: Unremarkable RESPIRATORY: Breathing is even, unlabored. Lung sounds are clear   CARDIOVASCULAR: Heart RRR no murmurs, rubs or gallops. No peripheral edema  GASTROINTESTINAL: Abdomen is soft, non-tender, not distended w/ normal bowel sounds.  GENITOURINARY: Bladder non tender, not distended  MUSCULOSKELETAL: No abnormal joints or musculature NEUROLOGIC: Cranial nerves 2-12 grossly intact;functional quad PSYCHIATRIC: calm, reasonable, no behavioral issues  Patient Active Problem List   Diagnosis Date Noted  . Constipation 01/10/2014  . Wheezing   . Dementia with behavioral disturbance 12/27/2013  . Functional quadriplegia 12/18/2013  . Severe protein-calorie malnutrition 12/18/2013  . CKD (chronic kidney disease), stage II 12/18/2013  . Anemia of chronic disease 12/18/2013  . Anxiety   . Dehydration 12/15/2013  . Nausea and vomiting 12/15/2013  . Palliative care encounter 11/02/2013  . Weakness generalized 11/02/2013  . Closed fracture of left distal radius 07/09/2013  . 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 Weakness of hand 12/31/2012  . Incisional hernia, without obstruction or gangrene 07/02/2012  . GERD (gastroesophageal reflux disease) 03/06/2012  . Depression 03/06/2012    CBC    Component Value Date/Time   WBC 6.6 12/18/2013  0423   WBC 8.7 06/24/2013 1240   RBC 3.08* 12/18/2013 0423   RBC 4.13 06/24/2013 1240   HGB 9.7* 12/18/2013 0423   HCT 28.8* 12/18/2013 0423   PLT 175 12/18/2013 0423   MCV 93.5 12/18/2013 0423   LYMPHSABS 2.1 12/15/2013 1746   LYMPHSABS 2.0 06/24/2013 1240   MONOABS 0.3 12/15/2013 1746   EOSABS 0.2 12/15/2013 1746   EOSABS 0.1 06/24/2013 1240   BASOSABS 0.0 12/15/2013 1746   BASOSABS 0.0 06/24/2013 1240    CMP     Component Value Date/Time  NA 142 12/18/2013 0423   NA 141 06/24/2013 1240   K 4.1 12/18/2013 0423   CL 105 12/18/2013 0423   CO2 26 12/18/2013 0423   GLUCOSE 88 12/18/2013 0423   GLUCOSE 91 06/24/2013 1240   BUN 21 12/18/2013 0423   BUN 15 06/24/2013 1240   CREATININE 1.51* 12/18/2013 0423   CALCIUM 9.3 12/18/2013 0423   PROT 7.2 12/15/2013 1746   PROT 6.5 06/24/2013 1240   ALBUMIN 3.4* 12/15/2013 1746   AST 15 12/15/2013 1746   ALT 11 12/15/2013 1746   ALKPHOS 71 12/15/2013 1746   BILITOT 0.2* 12/15/2013 1746   GFRNONAA 39* 12/18/2013 0423   GFRAA 45* 12/18/2013 0423    Assessment and Plan  ALS (amyotrophic lateral sclerosis) Pt does not want valium. She would like baclofen, not prn but regularly at noon, 4p and 8P so this has been changed  Constipation Pt has asked for lactulose nightly and senakot BID prn and these orders have been written  Wheezing Pt has asked for xopenex regularly to keep her from wheezing;this has been done  Anxiety Again pt is not interested in valium but does want her ativan scheduled 2mg  at night; 1 mg q 6 prn, sometimes uses 2 p, sometimes more or lessills a day   Date of service 12/29/2013 Margit HanksALEXANDER, Maryon Kemnitz D, MD

## 2014-01-04 DIAGNOSIS — G121 Other inherited spinal muscular atrophy: Secondary | ICD-10-CM

## 2014-01-04 DIAGNOSIS — S52502S Unspecified fracture of the lower end of left radius, sequela: Secondary | ICD-10-CM

## 2014-01-04 DIAGNOSIS — F329 Major depressive disorder, single episode, unspecified: Secondary | ICD-10-CM

## 2014-01-04 DIAGNOSIS — M6289 Other specified disorders of muscle: Secondary | ICD-10-CM

## 2014-01-04 DIAGNOSIS — R531 Weakness: Secondary | ICD-10-CM

## 2014-01-10 ENCOUNTER — Encounter: Payer: Self-pay | Admitting: Internal Medicine

## 2014-01-10 DIAGNOSIS — R062 Wheezing: Secondary | ICD-10-CM | POA: Insufficient documentation

## 2014-01-10 DIAGNOSIS — K5900 Constipation, unspecified: Secondary | ICD-10-CM | POA: Insufficient documentation

## 2014-01-10 NOTE — Assessment & Plan Note (Signed)
Again pt is not interested in valium but does want her ativan scheduled 2mg  at night; 1 mg q 6 prn, sometimes uses 2 p, sometimes more or lessills a day

## 2014-01-10 NOTE — Assessment & Plan Note (Signed)
Pt has asked for lactulose nightly and senakot BID prn and these orders have been written

## 2014-01-10 NOTE — Assessment & Plan Note (Signed)
Pt does not want valium. She would like baclofen, not prn but regularly at noon, 4p and 8P so this has been changed

## 2014-01-10 NOTE — Assessment & Plan Note (Signed)
Pt has asked for xopenex regularly to keep her from wheezing;this has been done

## 2014-01-13 ENCOUNTER — Other Ambulatory Visit: Payer: Self-pay | Admitting: *Deleted

## 2014-01-13 MED ORDER — FENTANYL 25 MCG/HR TD PT72: 25.0000 ug | MEDICATED_PATCH | TRANSDERMAL | Status: DC

## 2014-01-13 NOTE — Telephone Encounter (Signed)
[facility] 

## 2014-02-01 ENCOUNTER — Non-Acute Institutional Stay (SKILLED_NURSING_FACILITY): Payer: Medicare Other | Admitting: Adult Health

## 2014-02-01 DIAGNOSIS — K5901 Slow transit constipation: Secondary | ICD-10-CM

## 2014-02-01 DIAGNOSIS — R062 Wheezing: Secondary | ICD-10-CM

## 2014-02-01 DIAGNOSIS — G1221 Amyotrophic lateral sclerosis: Secondary | ICD-10-CM

## 2014-02-01 DIAGNOSIS — K219 Gastro-esophageal reflux disease without esophagitis: Secondary | ICD-10-CM

## 2014-02-01 DIAGNOSIS — R532 Functional quadriplegia: Secondary | ICD-10-CM

## 2014-02-01 DIAGNOSIS — F419 Anxiety disorder, unspecified: Secondary | ICD-10-CM

## 2014-02-07 ENCOUNTER — Encounter: Payer: Self-pay | Admitting: Adult Health

## 2014-02-07 MED ORDER — SENNA 8.6 MG PO TABS: 1.0000 | ORAL_TABLET | Freq: Two times a day (BID) | ORAL | Status: AC

## 2014-02-07 MED ORDER — BACLOFEN 5 MG HALF TABLET: 10.0000 mg | ORAL_TABLET | Freq: Three times a day (TID) | ORAL | Status: AC

## 2014-02-07 NOTE — Progress Notes (Signed)
Patient ID: Katherine Potts, female   DOB: June 01, 1960, 53 y.o.   MRN: [id number]  [facility]     Allergies  Allergen Reactions  . Bactrim [Sulfamethoxazole-Trimethoprim] Nausea Only  . Ciprofloxacin Nausea And Vomiting       Chief Complaint  Patient presents with  . Medical Management of Chronic Issues    HPI:    Past Medical History  Diagnosis Date  . GERD (gastroesophageal reflux disease)   . Anxiety   . Depression   . MVP (mitral valve prolapse)   . Cervical spondylosis   . 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 Pneumonia   . Constipation   . Wheezing     Past Surgical History  Procedure Laterality Date  . Eye surgery    . Breast surgery    . Vagina surgery      mesh  . Kidney donation      Right nephrectomy  . Abdominal hernia repair    . Orif wrist fracture  07/09/2013    DR Melvyn NovasTMANN  . Orif wrist fracture Left 07/09/2013    Procedure: OPEN REDUCTION INTERNAL FIXATION (ORIF) LEFT  WRIST FRACTURE;  Surgeon: Sharma CovertFred W Ortmann, MD;  Location: [facility] OR;  Service: Orthopedics;  Laterality: Left;    VITAL SIGNS BP 91/49 mmHg  Pulse 109  Ht 5\' 4"  (1.626 m)  Wt 142 lb (64.411 kg)  BMI 24.36 kg/m2   Outpatient Encounter Prescriptions as of 02/01/2014  Medication Sig  . ARIPiprazole (ABILIFY) 2 MG tablet Take 1 tablet (2 mg total) by mouth daily.  . baclofen (LIORESAL) 5 mg TABS tablet Take 0.5 tablets (5 mg total) by mouth 4 (four) times daily. (Patient taking differently: Take 5 mg by mouth 3 (three) times daily. )  . calcium carbonate (TUMS - DOSED IN MG ELEMENTAL CALCIUM) 500 MG chewable tablet Chew 1 tablet by mouth daily.  Marland Kitchen. dexamethasone (DECADRON) 2 MG tablet Take 1 tablet (2 mg total) by mouth daily.  . diazepam (VALIUM) 5 MG tablet Take 0.5 tablets (2.5 mg total) by mouth every 12 (twelve) hours as needed for muscle spasms. (Patient taking differently: Take 2.5 mg by mouth every 8 (eight) hours as needed for muscle spasms.  )  . fentaNYL (DURAGESIC - DOSED MCG/HR) 25 MCG/HR patch Place 1 patch (25 mcg total) onto the skin every 3 (three) days.  Marland Kitchen. FLUoxetine (PROZAC) 20 MG capsule Take 1 capsule (20 mg total) by mouth daily. (Patient taking differently: Take 30 mg by mouth daily. )  . lactulose (CHRONULAC) 10 GM/15ML solution Take 10 g by mouth daily.  Marland Kitchen. levalbuterol (XOPENEX) 0.63 MG/3ML nebulizer solution Take 0.63 mg by nebulization every 8 (eight) hours as needed for wheezing or shortness of breath.  . lidocaine (LIDODERM) 5 % Place 2 patches onto the skin daily. Remove & Discard patch within 12 hours or as directed by MD (Patient taking differently: Place 2 patches onto the skin daily. Remove & Discard patch within 12 hours to back)  . LORazepam (ATIVAN) 1 MG tablet Take 1-2 tablets (1-2 mg total) by mouth 3 (three) times daily as needed for anxiety. (Patient taking differently: Take 1 mg by mouth 3 (three) times daily. Takes 1 mg twice daily and 2 mg nightly and 1 mg every 6 hours as needed)  . methadone (DOLOPHINE) 5 MG tablet Take 1 tablet (5 mg total) by mouth every 12 (twelve) hours.  Marland Kitchen. omeprazole (PRILOSEC) 40 MG capsule Take 40 mg by mouth  daily.  . ondansetron (ZOFRAN) 4 MG tablet Take 1 tablet (4 mg total) by mouth every 6 (six) hours.  . Oxycodone HCl 10 MG TABS Take 1 tablet (10 mg total) by mouth every 4 (four) hours as needed. For pain (Patient taking differently: Take 15 mg by mouth every 4 (four) hours as needed. For pain)  . promethazine (PHENERGAN) 25 MG tablet Take 25 mg by mouth every 6 (six) hours as needed for nausea or vomiting.  . senna (SENOKOT) 8.6 MG TABS tablet Take 1 tablet by mouth 2 (two) times daily as needed for mild constipation.  . vitamin C (ASCORBIC ACID) 500 MG tablet Take 1 tablet (500 mg total) by mouth daily.  . [DISCONTINUED] esomeprazole (NEXIUM) 40 MG capsule Take 1 capsule (40 mg total) by mouth 2 (two) times daily. (Patient not taking: Reported on 02/07/2014)      SIGNIFICANT DIAGNOSTIC EXAMS  LABS REVIEWED:   12-21-13: glcuose 90; bun 21.4; creat 1.39; k+4.0; na++143 12-23-13: glucose 85; bun 25; creat 1.53; k+4.2; na++143   Review of Systems  Constitutional: Negative for malaise/fatigue.  Respiratory: Positive for shortness of breath. Negative for cough.   Gastrointestinal: Negative for heartburn, abdominal pain and constipation.       Has hard time eating   Musculoskeletal: Positive for myalgias, back pain and joint pain.       Has back pain; and increased spasticity   Skin: Negative.   Psychiatric/Behavioral: Negative for depression. The patient is nervous/anxious.      Physical Exam  Constitutional: No distress.  thin  Neck: Neck supple. No JVD present.  Cardiovascular: Normal rate, regular rhythm and intact distal pulses.   Respiratory: Effort normal and breath sounds normal. No respiratory distress.  GI: Soft. Bowel sounds are normal. She exhibits no distension. There is no tenderness.  Musculoskeletal: She exhibits no edema.  Left hand in brace Able to move lower extremities; but unable to stand Has limited range of motion in right extremities   Neurological: She is alert.  Skin: Skin is warm and dry. She is not diaphoretic.       ASSESSMENT/ PLAN:  1.  Functional quadriplegia: she is having increased spasticity in her extremities: will increase her baclofen to 10 mg three times daily will continue valium 2.5 mg every 8 hour as needed for spasms and will monitor her status.   2. ALS: she has difficulty with vocalization (for volume) will continue her decadron 2 mg daily; for her chronic pain will continue duragesic 25 mcg patch every 3 days;  2 lidoderm patches to her back; methadone 5 mg twice daily and has oxycodone 15 mg every 4 hours as needed for pain will monitor her status.   3. Depression with psychotic features: will continue prozac 30 mg daily; abilify 2 mg daily; takes ativan 1 mg twice daily; 2 mg  nightly; and 1 mg every 6 hours as needed will not make changes and wl monitor her status.   4. GERD: will continue prilosec 40 mg daily; zofran 4 mg every 6 hours for nausea; for her difficulty swallow will have speech therapy see her.   5. Constipation: will change her senna to twice daily   6. Wheezing: will continue xopenex every 8 hours as needed and will have nursing help with I/S four times daily and will monitor her status.      Synthia Innocenteborah Green NP [facility]  Contact [phone] Monday through Friday 8am- 5pm  After hours call [phone]

## 2014-02-09 ENCOUNTER — Other Ambulatory Visit: Payer: Self-pay | Admitting: *Deleted

## 2014-02-09 MED ORDER — FENTANYL 25 MCG/HR TD PT72: MEDICATED_PATCH | TRANSDERMAL | Status: DC

## 2014-02-09 NOTE — Telephone Encounter (Signed)
[facility] 

## 2014-02-10 ENCOUNTER — Telehealth: Payer: Self-pay | Admitting: Neurology

## 2014-02-10 DIAGNOSIS — G1221 Amyotrophic lateral sclerosis: Secondary | ICD-10-CM

## 2014-02-10 NOTE — Telephone Encounter (Signed)
I called the patient. She is coming home from the nursing home this weekend. The patient is bedridden, has quadriparesis. Hospice apparently is not involved, I will try to get this referral going.

## 2014-02-10 NOTE — Telephone Encounter (Signed)
Patient's son is calling to update Dr. Anne HahnWillis that patient has been in a skilled nursing facility for about 10 weeks and will be coming home this week and Dr. Anne HahnWillis may need to write off on patient getting a hospital bed and medication. Patient's son needs to discuss. Please call. Thank you.

## 2014-02-11 NOTE — Telephone Encounter (Signed)
[facility] has been called and the referral is on doctors desk to complete.

## 2014-02-12 ENCOUNTER — Non-Acute Institutional Stay (SKILLED_NURSING_FACILITY): Payer: Medicare Other | Admitting: Adult Health

## 2014-02-12 ENCOUNTER — Other Ambulatory Visit: Payer: Self-pay | Admitting: *Deleted

## 2014-02-12 DIAGNOSIS — F0391 Unspecified dementia with behavioral disturbance: Secondary | ICD-10-CM

## 2014-02-12 DIAGNOSIS — R532 Functional quadriplegia: Secondary | ICD-10-CM

## 2014-02-12 DIAGNOSIS — G1221 Amyotrophic lateral sclerosis: Secondary | ICD-10-CM

## 2014-02-12 DIAGNOSIS — N182 Chronic kidney disease, stage 2 (mild): Secondary | ICD-10-CM

## 2014-02-12 DIAGNOSIS — F03918 Unspecified dementia, unspecified severity, with other behavioral disturbance: Secondary | ICD-10-CM

## 2014-02-12 MED ORDER — FENTANYL 25 MCG/HR TD PT72: MEDICATED_PATCH | TRANSDERMAL | Status: AC

## 2014-02-12 NOTE — Telephone Encounter (Signed)
[facility] 

## 2014-02-15 ENCOUNTER — Telehealth: Payer: Self-pay | Admitting: Neurology

## 2014-02-16 NOTE — Telephone Encounter (Signed)
Spoke to hospice nurse Maura and she relayed that patient needed better pain control and she would have their physician address the pain medications.  She has faxed over the orders and the medication changes made by hospice physician for Dr. Anne HahnWillis to sign.  I notified hospice that the physican will not be back in the office until 02-22-14.

## 2014-02-22 ENCOUNTER — Ambulatory Visit: Payer: Medicare Other | Admitting: Neurology

## 2014-02-22 ENCOUNTER — Telehealth: Payer: Self-pay | Admitting: Neurology

## 2014-02-22 NOTE — Telephone Encounter (Signed)
This patient did not show for a revisit appointment today. The patient has ALS, recently entered hospice, now quadriparetic.

## 2014-02-24 ENCOUNTER — Encounter: Payer: Self-pay | Admitting: Neurology

## 2014-02-24 ENCOUNTER — Encounter: Payer: Self-pay | Admitting: Adult Health

## 2014-02-24 NOTE — Progress Notes (Signed)
Patient ID: Katherine Potts, female   DOB: March 24, 1960, 53 y.o.   MRN: [id number]  [facility]     Allergies  Allergen Reactions  . Bactrim [Sulfamethoxazole-Trimethoprim] Nausea Only  . Ciprofloxacin Nausea And Vomiting       Chief Complaint  Patient presents with  . Discharge Note    HPI:  She is being discharged to home with home health for pt/ot/aid/sw to improve upon her strength; independence; adl care and social service. She will need a hospital bed for pain management and for bed mobility which she cannot achieve through a regular bed. She will need her prescriptions to be written and will need a follow up with her pcp.    Past Medical History  Diagnosis Date  . GERD (gastroesophageal reflux disease)   . Anxiety   . Depression   . MVP (mitral valve prolapse)   . Cervical spondylosis   . 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 Pneumonia   . Constipation   . Wheezing     Past Surgical History  Procedure Laterality Date  . Eye surgery    . Breast surgery    . Vagina surgery      mesh  . Kidney donation      Right nephrectomy  . Abdominal hernia repair    . Orif wrist fracture  07/09/2013    DR Melvyn NovasTMANN  . Orif wrist fracture Left 07/09/2013    Procedure: OPEN REDUCTION INTERNAL FIXATION (ORIF) LEFT  WRIST FRACTURE;  Surgeon: Sharma CovertFred W Ortmann, MD;  Location: [facility] OR;  Service: Orthopedics;  Laterality: Left;    VITAL SIGNS BP 100/54 mmHg  Pulse 100  Ht 5\' 4"  (1.626 m)  Wt 142 lb (64.411 kg)  BMI 24.36 kg/m2   Outpatient Encounter Prescriptions as of 02/12/2014  Medication Sig  . ARIPiprazole (ABILIFY) 2 MG tablet Take 1 tablet (2 mg total) by mouth daily.  . baclofen (LIORESAL) 5 mg TABS tablet Take 1 tablet (10 mg total) by mouth 3 (three) times daily.  . calcium carbonate (TUMS - DOSED IN MG ELEMENTAL CALCIUM) 500 MG chewable tablet Chew 1 tablet by mouth daily.  Marland Kitchen. dexamethasone (DECADRON) 2 MG tablet Take 1 tablet (2 mg  total) by mouth daily.  . diazepam (VALIUM) 5 MG tablet Take 0.5 tablets (2.5 mg total) by mouth every 12 (twelve) hours as needed for muscle spasms. (Patient taking differently: Take 2.5 mg by mouth every 8 (eight) hours as needed for muscle spasms. )  . fentaNYL (DURAGESIC - DOSED MCG/HR) 25 MCG/HR patch Apply one patch topically every 72 hours for pain  . FLUoxetine (PROZAC) 20 MG capsule Take 1 capsule (20 mg total) by mouth daily. (Patient taking differently: Take 30 mg by mouth daily. )  . lactulose (CHRONULAC) 10 GM/15ML solution Take 10 g by mouth daily.  Marland Kitchen. levalbuterol (XOPENEX) 0.63 MG/3ML nebulizer solution Take 0.63 mg by nebulization every 8 (eight) hours as needed for wheezing or shortness of breath.  . lidocaine (LIDODERM) 5 % Place 2 patches onto the skin daily. Remove & Discard patch within 12 hours or as directed by MD (Patient taking differently: Place 2 patches onto the skin daily. Remove & Discard patch within 12 hours to back)  . LORazepam (ATIVAN) 1 MG tablet Take 1-2 tablets (1-2 mg total) by mouth 3 (three) times daily as needed for anxiety. (Patient taking differently: Take 1 mg by mouth 3 (three) times daily. Takes 1 mg twice daily and 2  mg nightly and 1 mg every 6 hours as needed)  . methadone (DOLOPHINE) 5 MG tablet Take 1 tablet (5 mg total) by mouth every 12 (twelve) hours.  Marland Kitchen. omeprazole (PRILOSEC) 40 MG capsule Take 40 mg by mouth daily.  . ondansetron (ZOFRAN) 4 MG tablet Take 1 tablet (4 mg total) by mouth every 6 (six) hours.  . Oxycodone HCl 10 MG TABS Take 1 tablet (10 mg total) by mouth every 4 (four) hours as needed. For pain (Patient taking differently: Take 15 mg by mouth every 4 (four) hours as needed. For pain)  . promethazine (PHENERGAN) 25 MG tablet Take 25 mg by mouth every 6 (six) hours as needed for nausea or vomiting.  . senna (SENOKOT) 8.6 MG TABS tablet Take 1 tablet (8.6 mg total) by mouth 2 (two) times daily.  . vitamin C (ASCORBIC ACID) 500 MG  tablet Take 1 tablet (500 mg total) by mouth daily.     SIGNIFICANT DIAGNOSTIC EXAMS   LABS REVIEWED:   12-21-13: glcuose 90; bun 21.4; creat 1.39; k+4.0; na++143 12-23-13: glucose 85; bun 25; creat 1.53; k+4.2; na++143     ROS  Constitutional: Negative for malaise/fatigue.  Respiratory: Positive for shortness of breath. Negative for cough.   Gastrointestinal: Negative for heartburn, abdominal pain and constipation.       Has hard time eating   Musculoskeletal: Positive for myalgias, back pain and joint pain.       Has back pain; pain is managed   Skin: Negative.   Psychiatric/Behavioral: Negative for depression. The patient not nervous .     Physical Exam Constitutional: No distress.  thin  Neck: Neck supple. No JVD present.  Cardiovascular: Normal rate, regular rhythm and intact distal pulses.   Respiratory: Effort normal and breath sounds normal. No respiratory distress.  GI: Soft. Bowel sounds are normal. She exhibits no distension. There is no tenderness.  Musculoskeletal: She exhibits no edema.  Left hand in brace Able to move lower extremities; but unable to stand Has limited range of motion in right extremities   Neurological: She is alert.  Skin: Skin is warm and dry. She is not diaphoretic.     ASSESSMENT/ PLAN:  Will discharge her to home with home health for pt/ot/aid/social services. She will need a hospital bed. Her prescriptions have been written for a 30 day supply of her medications with ativan 2 mg #30 tabs; 1 mg #90 tabs; methadone 5 mg #60 tabs; valium #15 tabs; oxycodone 15 mg #30 tabs; duragesic 25 mcg #10 patches. She has a follow up with her pcp Lesia Sagokeith Willis 02-22-14 9:45 am  Time spent with patient 45 minutes.    Synthia Innocenteborah Kreg Earhart NP [facility]  Contact [phone] Monday through Friday 8am- 5pm  After hours call [phone]

## 2014-03-01 ENCOUNTER — Telehealth: Payer: Self-pay | Admitting: Neurology

## 2014-03-01 NOTE — Telephone Encounter (Signed)
Maura with Hospice is calling regarding patient. Patient is declining. Please call Sharman Crate and discuss. Thank you.

## 2014-03-01 NOTE — Telephone Encounter (Signed)
I called concerning the patient. The patient has a bladder infection, and his running fevers. The patient is not doing well with oral intake of food and fluids. She is running a fever of 101. She is on Ativan, methadone, oxycodone. She is having some neuromuscular pain. The nurse believes that she will not survive long.

## 2014-03-10 ENCOUNTER — Telehealth: Payer: Self-pay | Admitting: *Deleted

## 2014-03-10 NOTE — Telephone Encounter (Signed)
Certificate of Death at front desk for pickup.

## 2014-03-17 ENCOUNTER — Telehealth: Payer: Self-pay | Admitting: Neurology

## 2014-03-17 NOTE — Telephone Encounter (Signed)
Patient's son wanted to inform Dr. Anne HahnWillis patient passed away on 03/05/14.  FYI

## 2015-04-10 IMAGING — CR DG CERVICAL SPINE 2 OR 3 VIEWS
3 series · 3 of 3 positions shown · non-contrast
Comparison: 12/05/2012

CLINICAL DATA: Back pain, shoulder pain

EXAM:
CERVICAL SPINE - 2-3 VIEW

[w cervical spine lat]
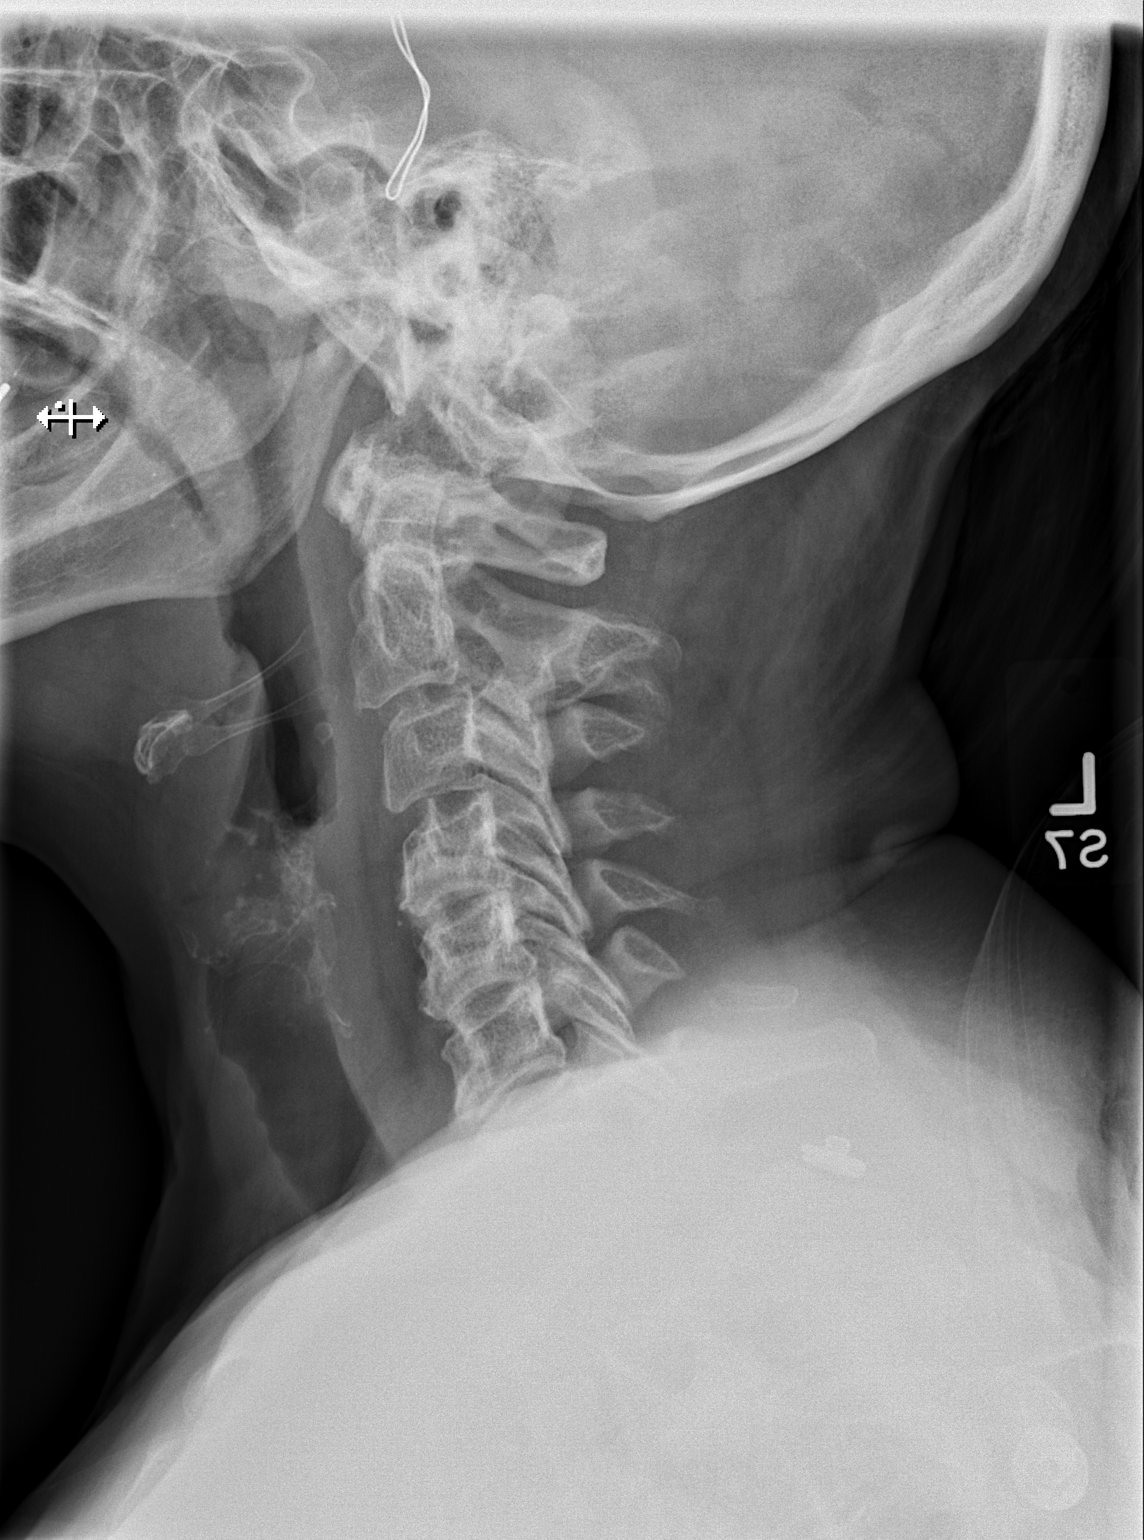

[w cervical swimmers]
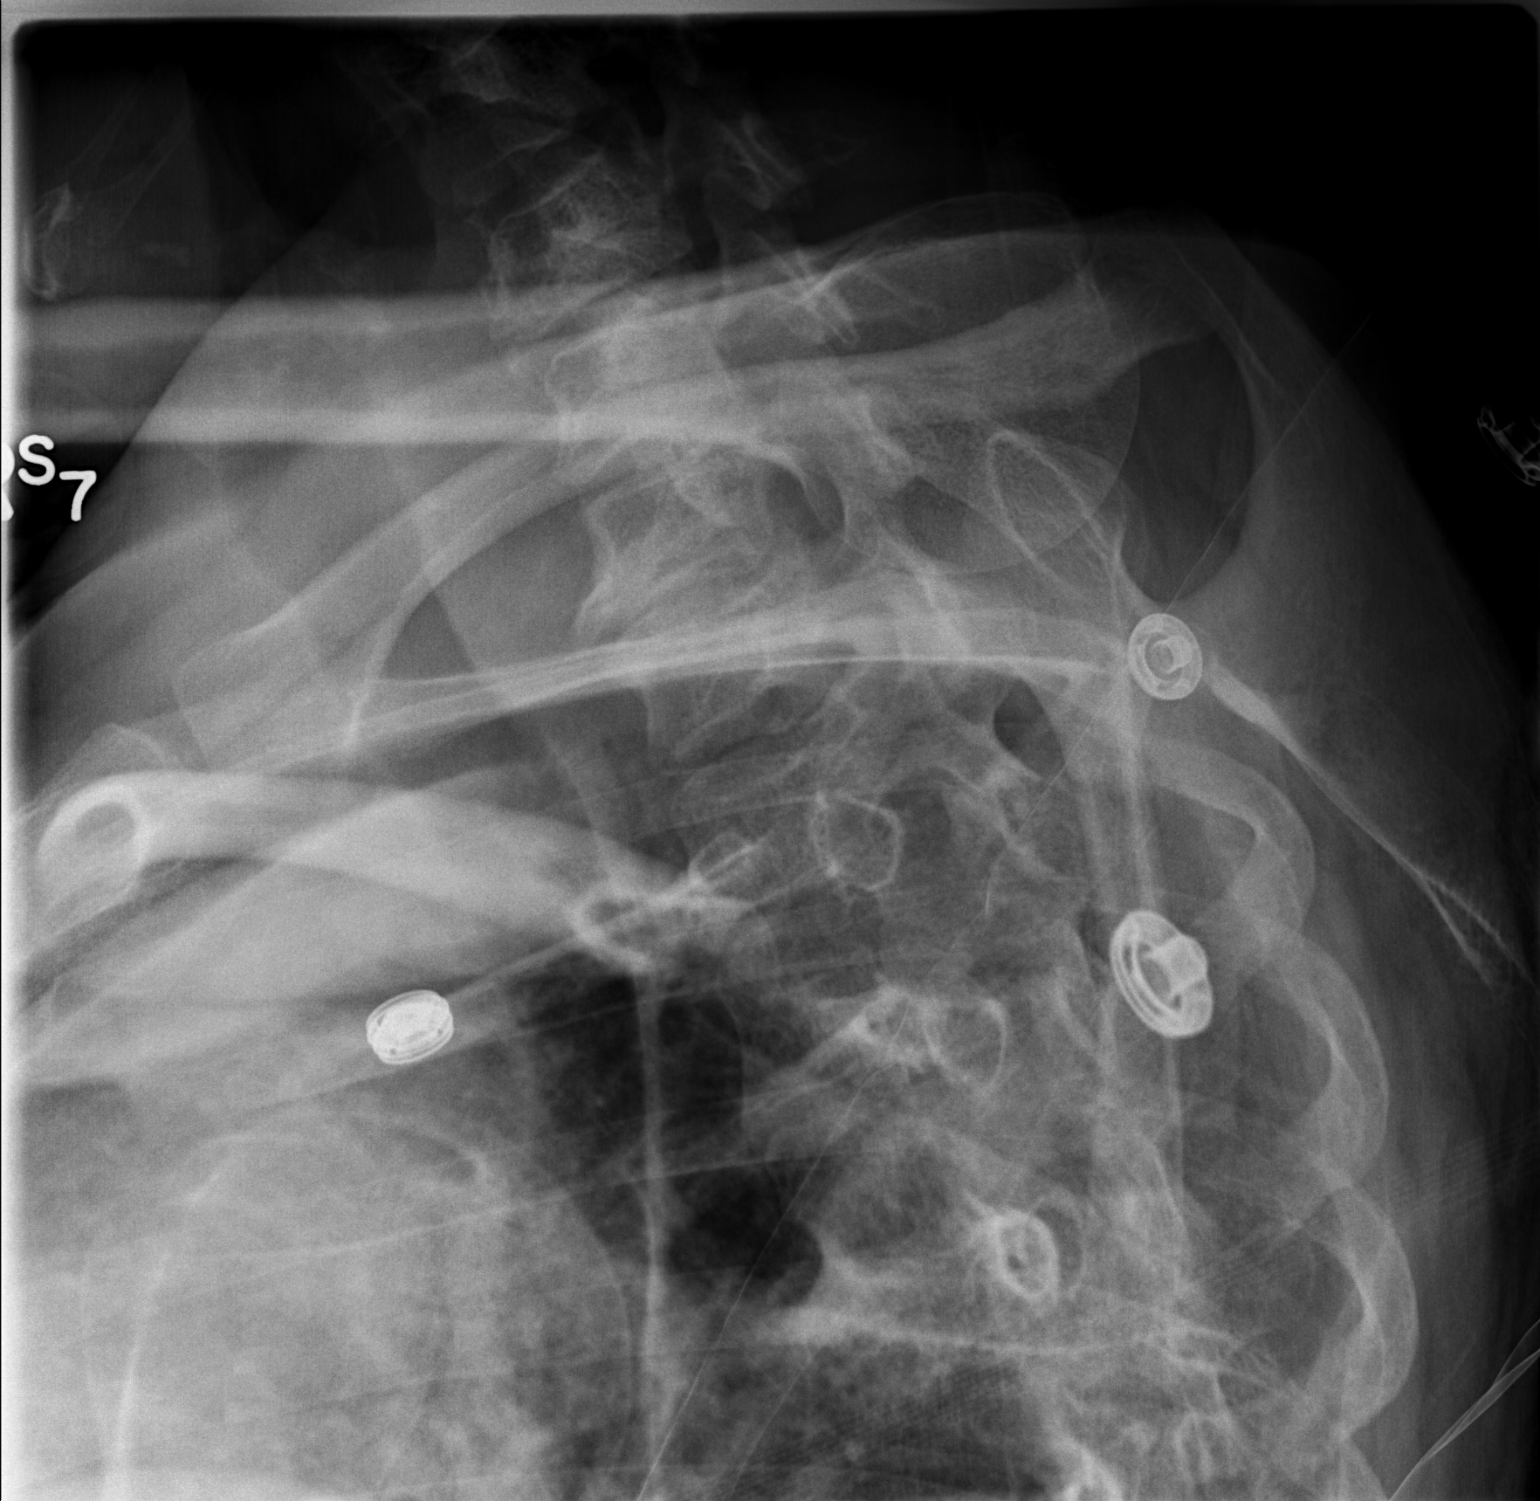

[t cervical spine ap]
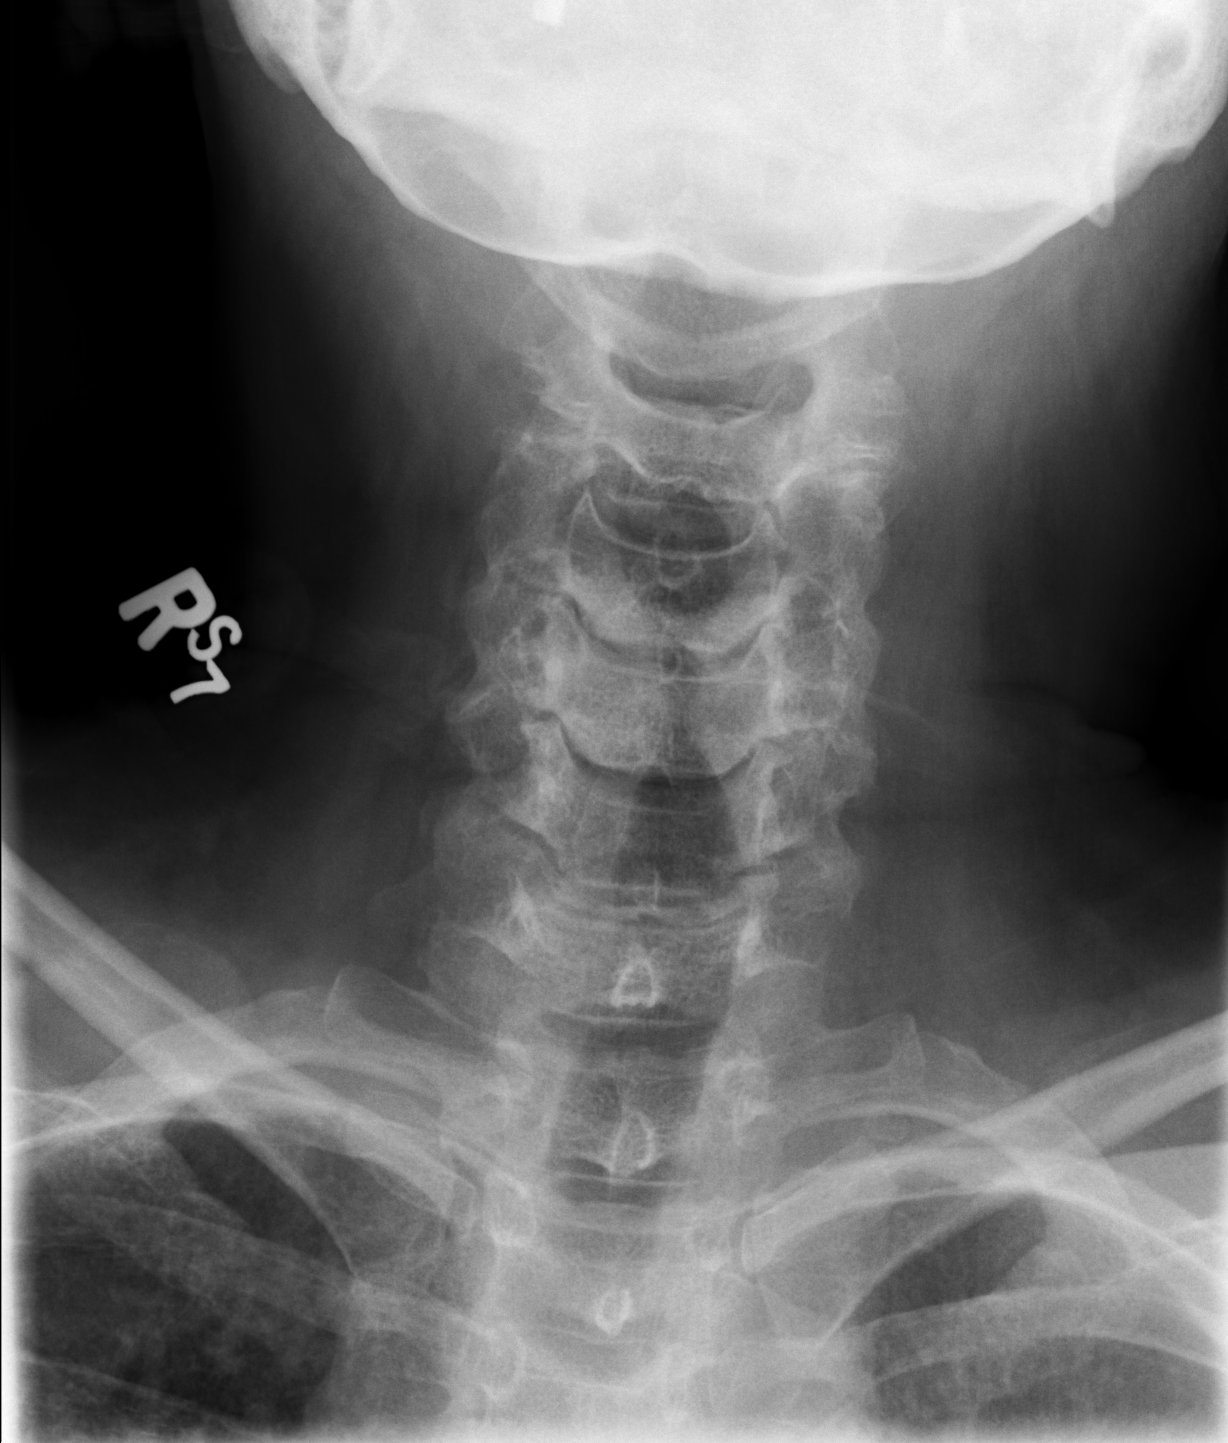

[3 of 3 positions shown; findings below may reference images not displayed]

FINDINGS: Three views of cervical spine submitted. No acute fracture or
subluxation. There is disc space flattening with anterior spurring
at C4-C5, C5-C6 and C6-C7 level. C7 vertebral body is poorly
visualized. No prevertebral soft tissue swelling. Mild degenerative
changes C1-C2 articulation. No prevertebral soft tissue swelling.
Cervical airway is patent.
IMPRESSION: No acute fracture or subluxation. Osteoarthritic changes as
described above.

## 2015-04-10 IMAGING — CR DG THORACIC SPINE 2V
3 series · 3 of 3 positions shown · non-contrast
Comparison: None.

CLINICAL DATA: Back pain, fall

EXAM:
THORACIC SPINE - 2 VIEW

[t thoracic spine ap]
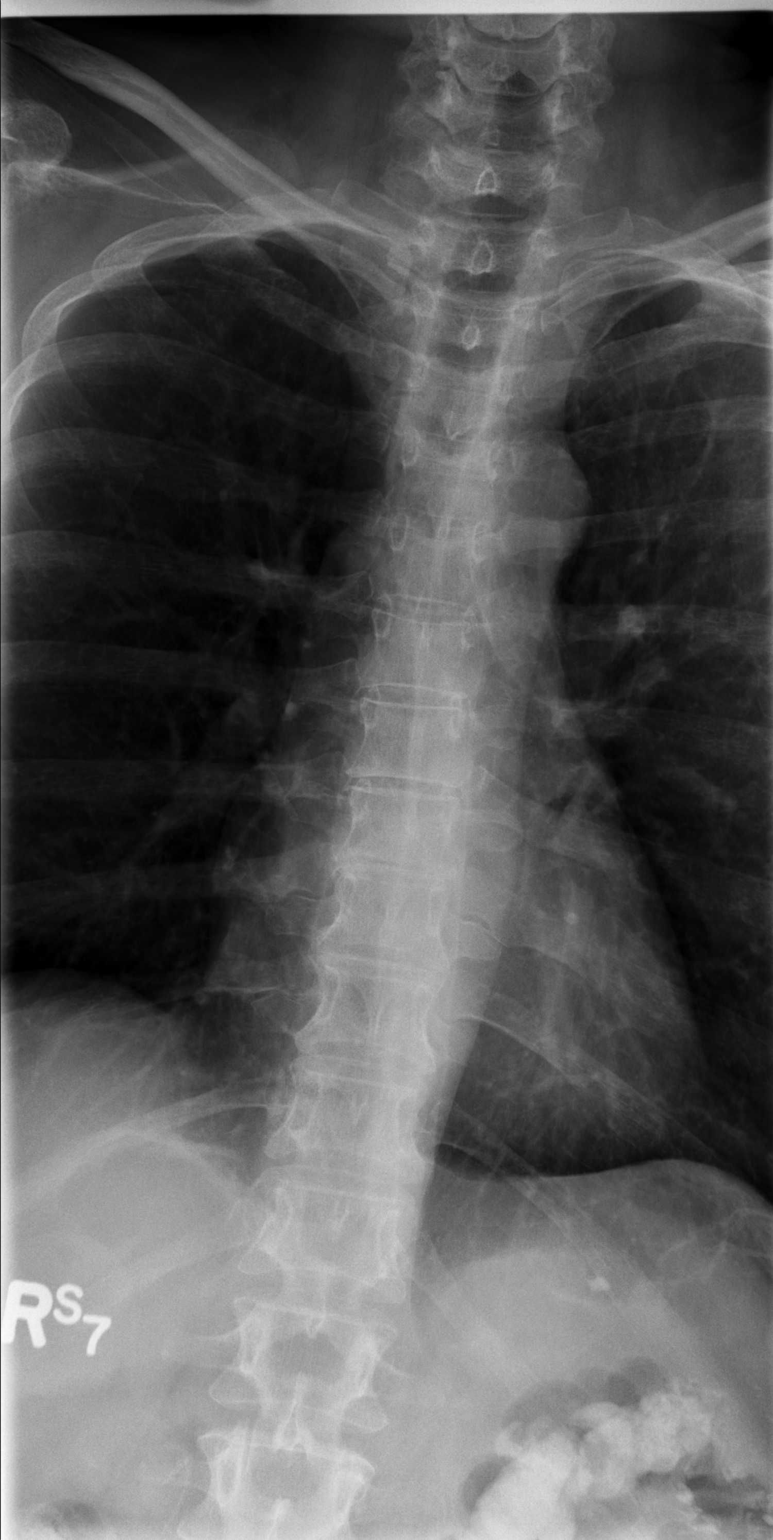

[t thoracic spine lat]
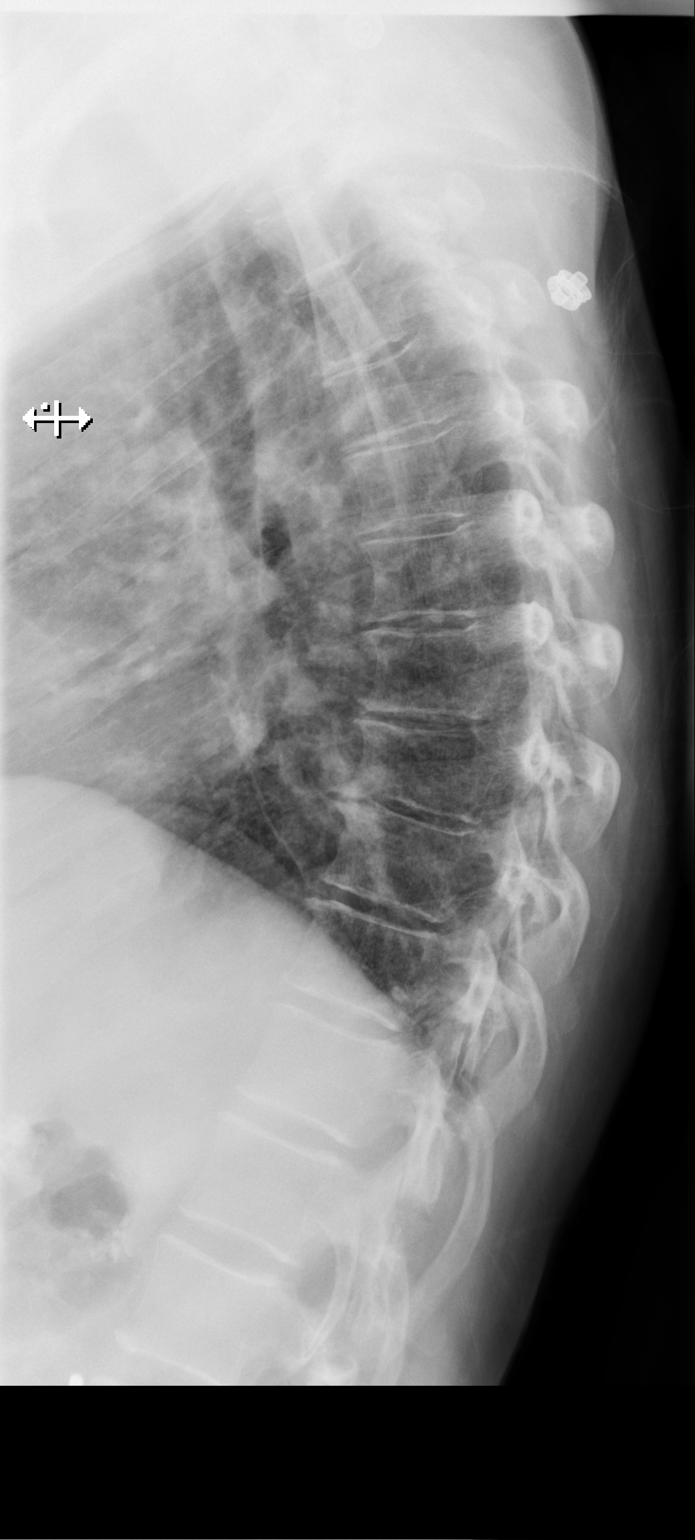

[t thoracic swimmers]
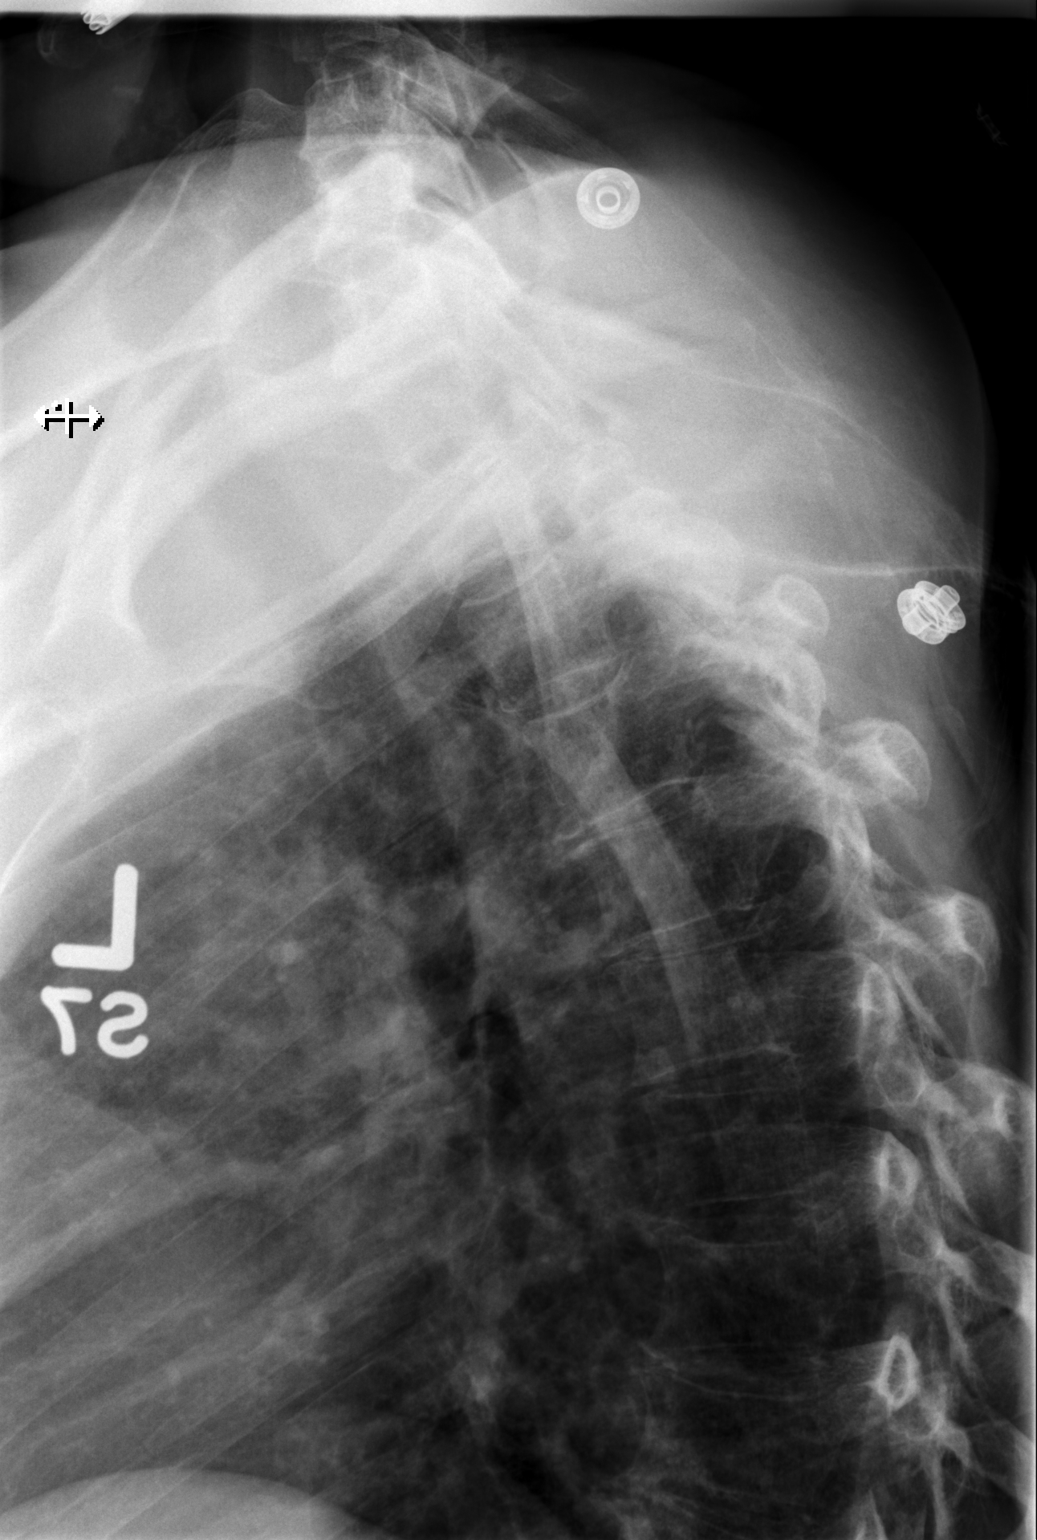

[3 of 3 positions shown; findings below may reference images not displayed]

FINDINGS: Three views of thoracic spine submitted. No acute fracture or
subluxation. Mild degenerative changes with anterior spurring mid
and lower thoracic spine.
IMPRESSION: No acute fracture or subluxation. Mild degenerative changes mid and
lower thoracic spine.

## 2015-04-22 NOTE — Telephone Encounter (Signed)
Error
